# Patient Record
Sex: Female | Born: 1945 | Race: White | Hispanic: No | Marital: Married | Smoking: Former smoker
Health system: Southern US, Community
[De-identification: ages and names within clinical notes are randomized; demographics above are authoritative.]

## PROBLEM LIST (undated history)

## (undated) DIAGNOSIS — Z8489 Family history of other specified conditions: Secondary | ICD-10-CM

## (undated) DIAGNOSIS — I10 Essential (primary) hypertension: Secondary | ICD-10-CM

## (undated) DIAGNOSIS — K219 Gastro-esophageal reflux disease without esophagitis: Secondary | ICD-10-CM

## (undated) DIAGNOSIS — M1990 Unspecified osteoarthritis, unspecified site: Secondary | ICD-10-CM

---

## 1998-02-20 ENCOUNTER — Other Ambulatory Visit: Admission: RE | Admit: 1998-02-20 | Discharge: 1998-02-20 | Payer: Self-pay | Admitting: Obstetrics and Gynecology

## 1999-05-28 ENCOUNTER — Other Ambulatory Visit: Admission: RE | Admit: 1999-05-28 | Discharge: 1999-05-28 | Payer: Self-pay | Admitting: Obstetrics and Gynecology

## 2000-08-08 ENCOUNTER — Encounter: Admission: RE | Admit: 2000-08-08 | Discharge: 2000-08-08 | Payer: Self-pay | Admitting: Internal Medicine

## 2000-08-08 ENCOUNTER — Encounter: Payer: Self-pay | Admitting: Internal Medicine

## 2000-12-21 ENCOUNTER — Encounter: Admission: RE | Admit: 2000-12-21 | Discharge: 2000-12-21 | Payer: Self-pay | Admitting: Obstetrics and Gynecology

## 2000-12-21 ENCOUNTER — Other Ambulatory Visit: Admission: RE | Admit: 2000-12-21 | Discharge: 2000-12-21 | Payer: Self-pay | Admitting: Obstetrics and Gynecology

## 2000-12-21 ENCOUNTER — Encounter: Payer: Self-pay | Admitting: Obstetrics and Gynecology

## 2002-11-02 ENCOUNTER — Encounter: Payer: Self-pay | Admitting: Obstetrics and Gynecology

## 2002-11-02 ENCOUNTER — Ambulatory Visit (HOSPITAL_COMMUNITY): Admission: RE | Admit: 2002-11-02 | Discharge: 2002-11-02 | Payer: Self-pay | Admitting: Obstetrics and Gynecology

## 2004-11-03 ENCOUNTER — Other Ambulatory Visit: Admission: RE | Admit: 2004-11-03 | Discharge: 2004-11-03 | Payer: Self-pay | Admitting: Obstetrics and Gynecology

## 2004-11-06 ENCOUNTER — Encounter: Admission: RE | Admit: 2004-11-06 | Discharge: 2004-11-06 | Payer: Self-pay | Admitting: Obstetrics and Gynecology

## 2004-12-15 ENCOUNTER — Ambulatory Visit (HOSPITAL_COMMUNITY): Admission: RE | Admit: 2004-12-15 | Discharge: 2004-12-15 | Payer: Self-pay | Admitting: Obstetrics and Gynecology

## 2004-12-15 IMAGING — US US PELVIS COMPLETE MODIFY
1 series · 18 of 25 positions shown · non-contrast
Comparison: none

CLINICAL DATA: Positive family history of ovarian carcinoma in mother (first degree relative).
 TRANSABDOMINAL AND TRANSVAGINAL PELVIC ULTRASOUND:
TECHNIQUE: Both transabdominal and transvaginal ultrasound examinations of the pelvis were performed including evaluation of the uterus, ovaries, adnexal regions, and pelvic cul-de-sac.
 The uterus is normal in postmenopausal size and appearance.  There is no evidence of fibroids or other uterine masses.  Endometrial thickness measures 2 mm on transvaginal sonography which is normal.  
 No pelvic or adnexal masses are identified on transabdominal sonography.  Small postmenopausal ovaries are seen bilaterally on transvaginal sonography, which are both normal in appearance.  There is no evidence of ovarian masses or cystic lesions.  No other adnexal masses are seen and there is no evidence of free fluid.

[Series 1: us transvaginal non-ob · 18 of 59 slices shown]
[im 1/59]
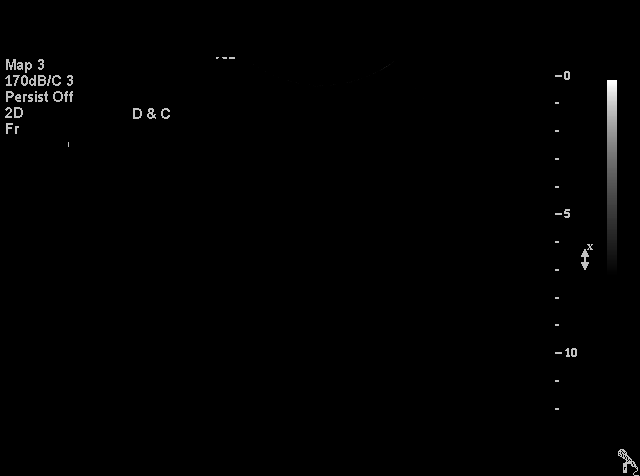
[im 5/59]
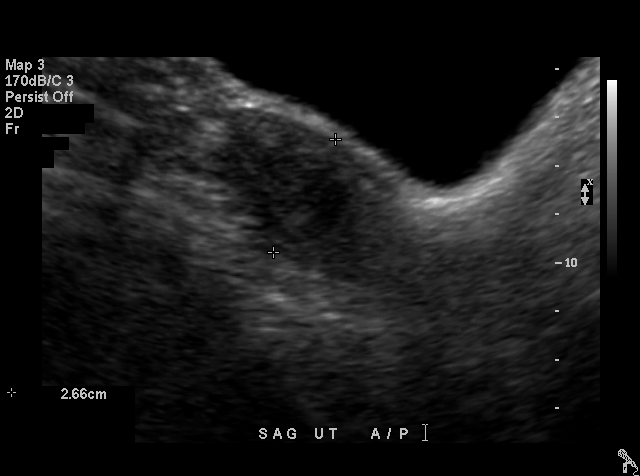
[im 8/59]
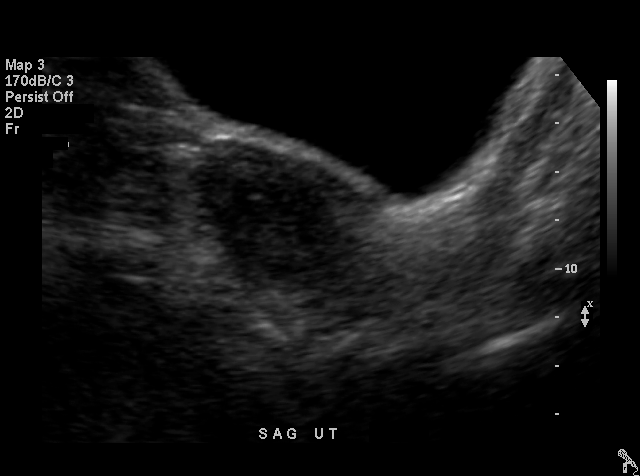
[im 10/59]
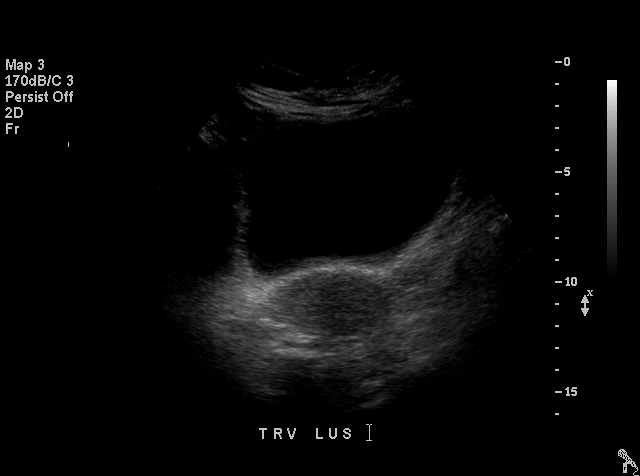
[im 15/59]
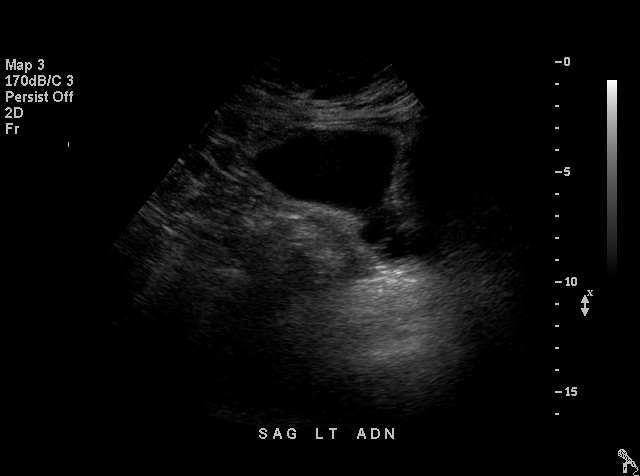
[im 17/59]
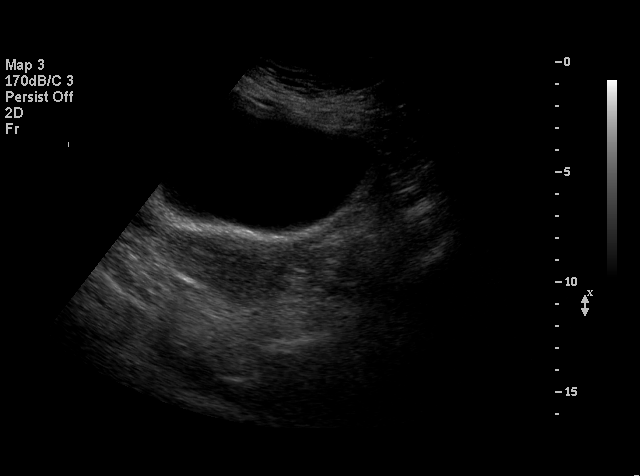
[im 22/59]
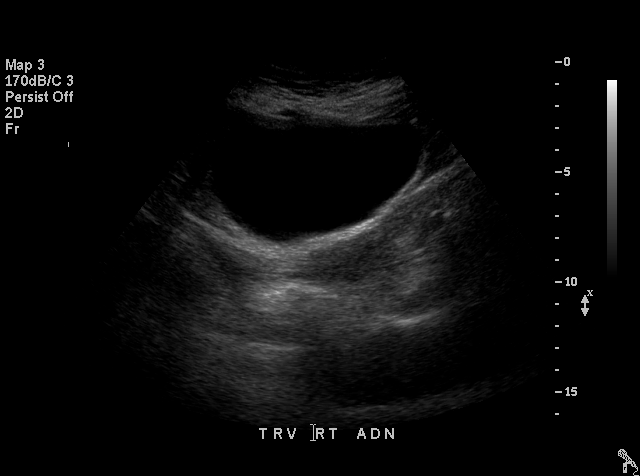
[im 25/59]
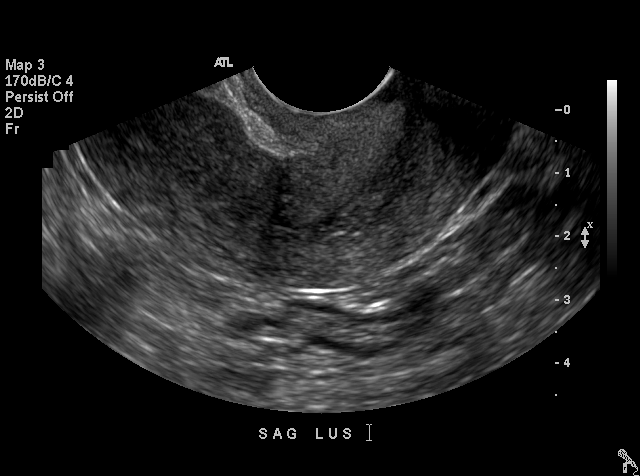
[im 27/59]
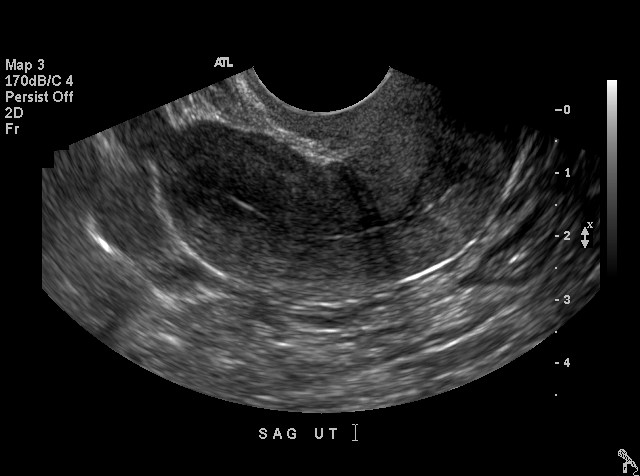
[im 32/59]
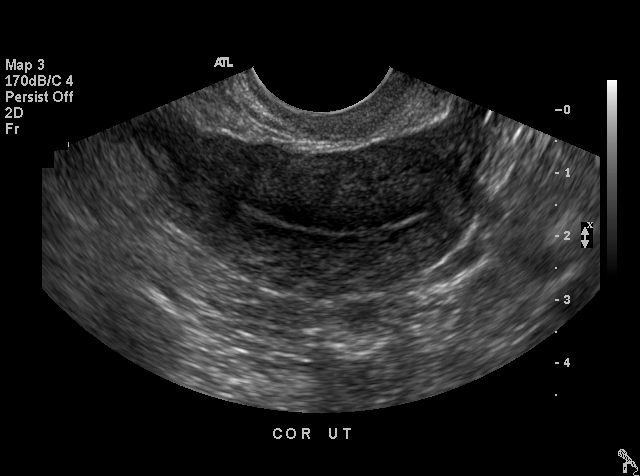
[im 34/59]
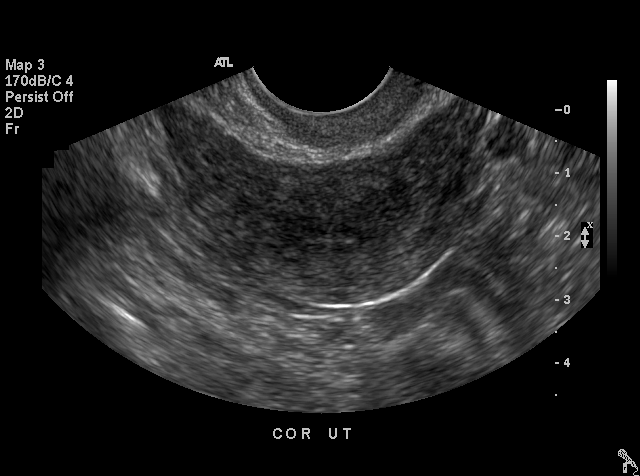
[im 37/59]
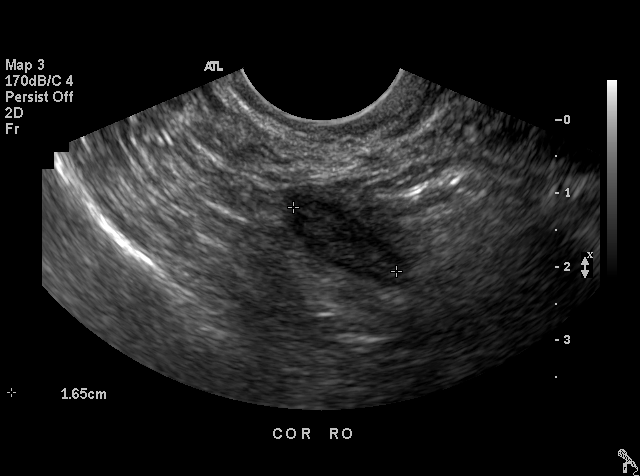
[im 42/59]
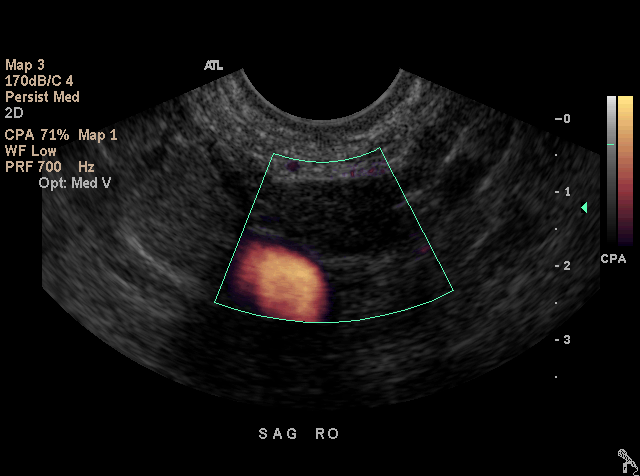
[im 44/59]
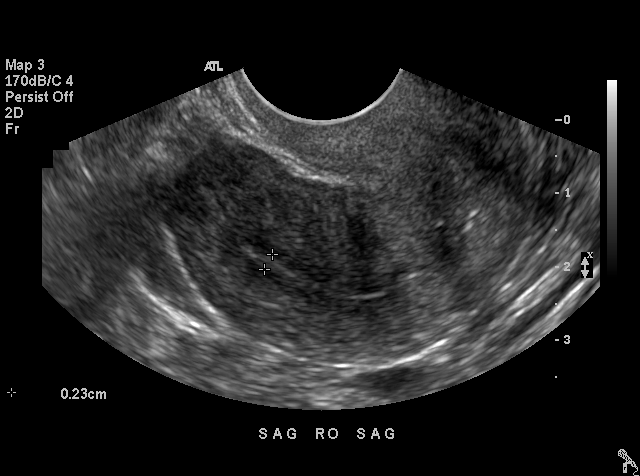
[im 49/59]
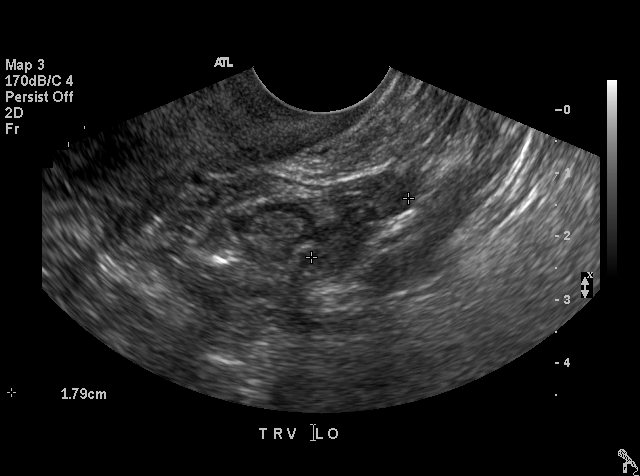
[im 51/59]
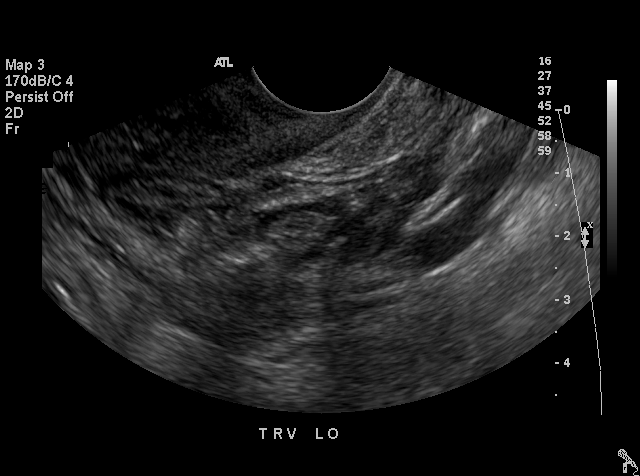
[im 54/59]
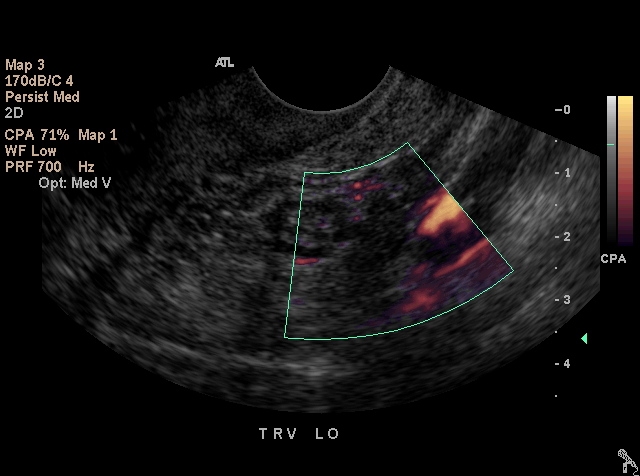
[im 59/59]
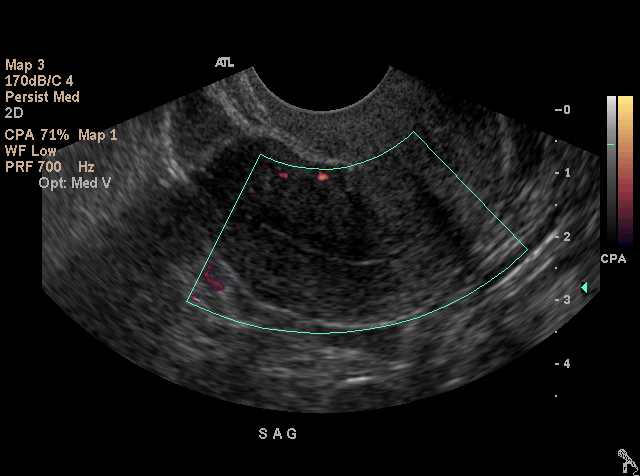

[18 of 25 positions shown; findings below may reference images not displayed]

IMPRESSION: 1.  Normal postmenopausal appearance of the uterus and both ovaries.
 2.  No evidence of ovarian or other pelvic masses.

## 2005-05-26 ENCOUNTER — Ambulatory Visit (HOSPITAL_COMMUNITY): Admission: RE | Admit: 2005-05-26 | Discharge: 2005-05-26 | Payer: Self-pay | Admitting: Internal Medicine

## 2005-05-26 IMAGING — CR DG CHEST 2V
2 series · 2 of 2 positions shown · non-contrast
Comparison: None.

CLINICAL DATA: Cough and fever. 
 CHEST ? 2 VIEW:

[view not recorded (1 of 2)]
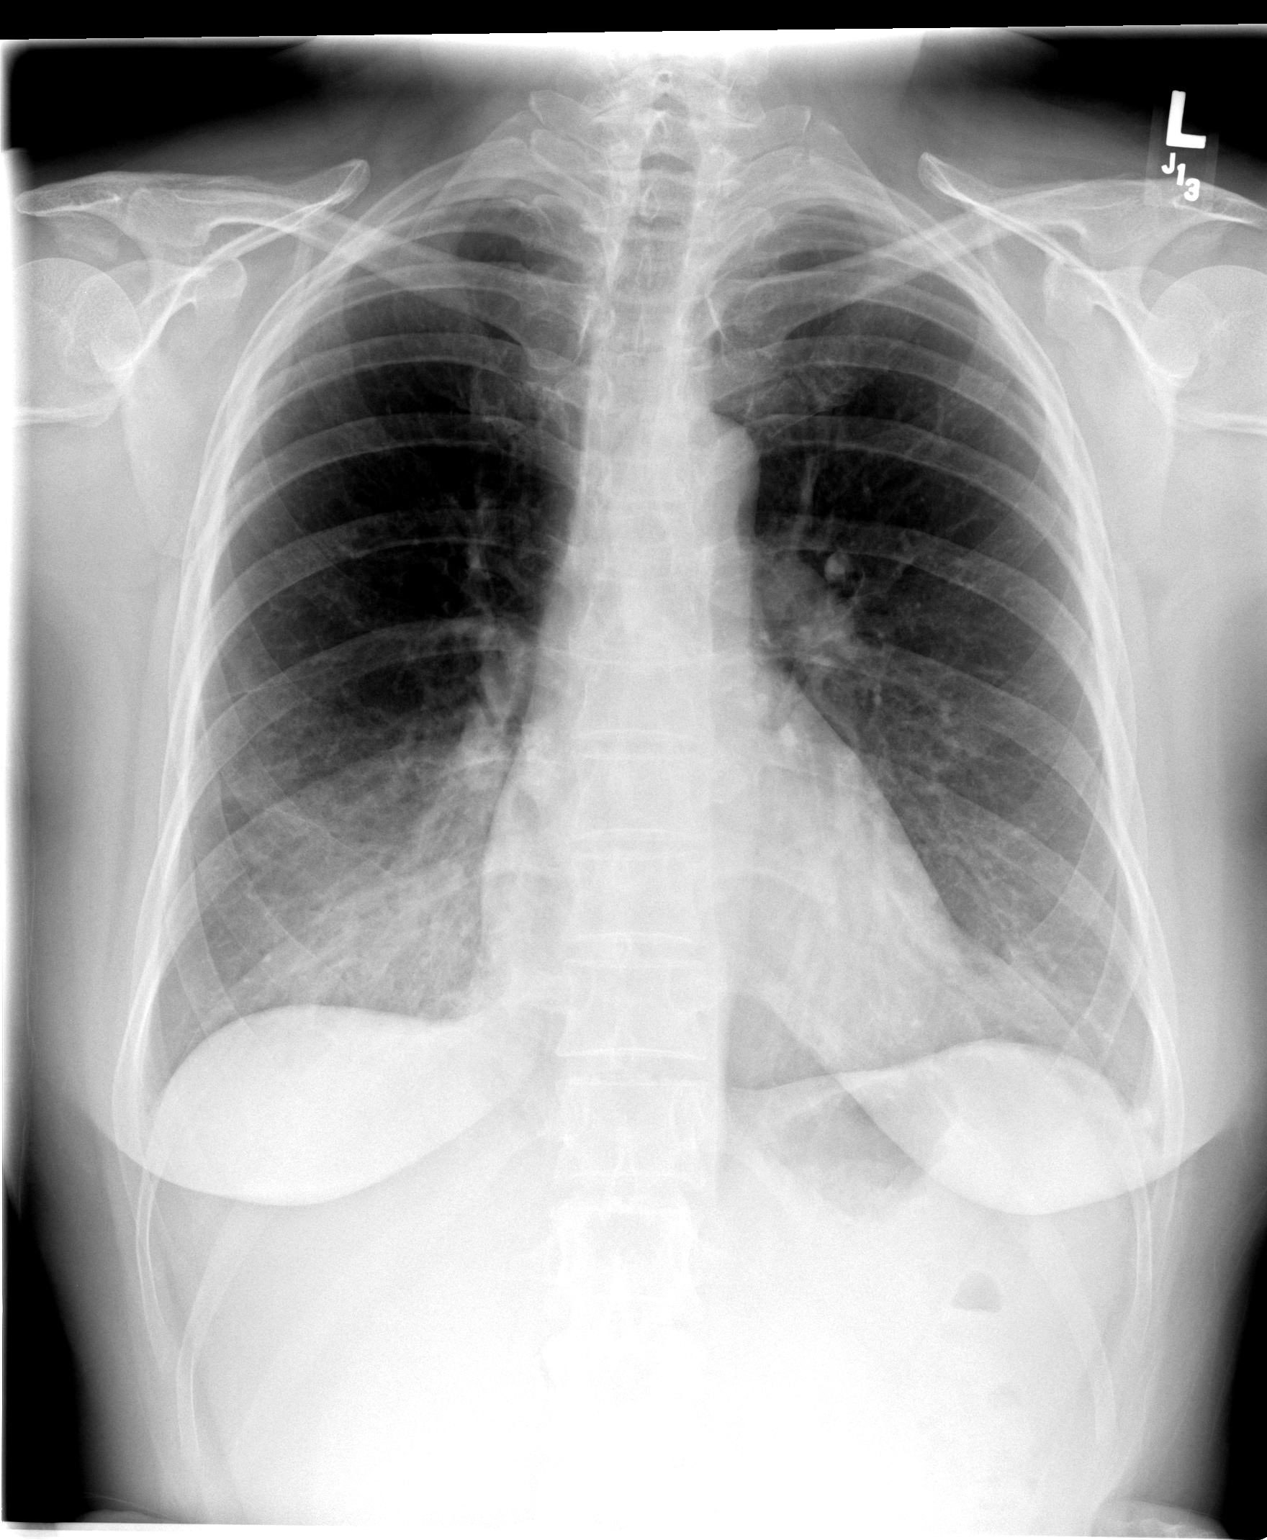

[view not recorded (2 of 2)]
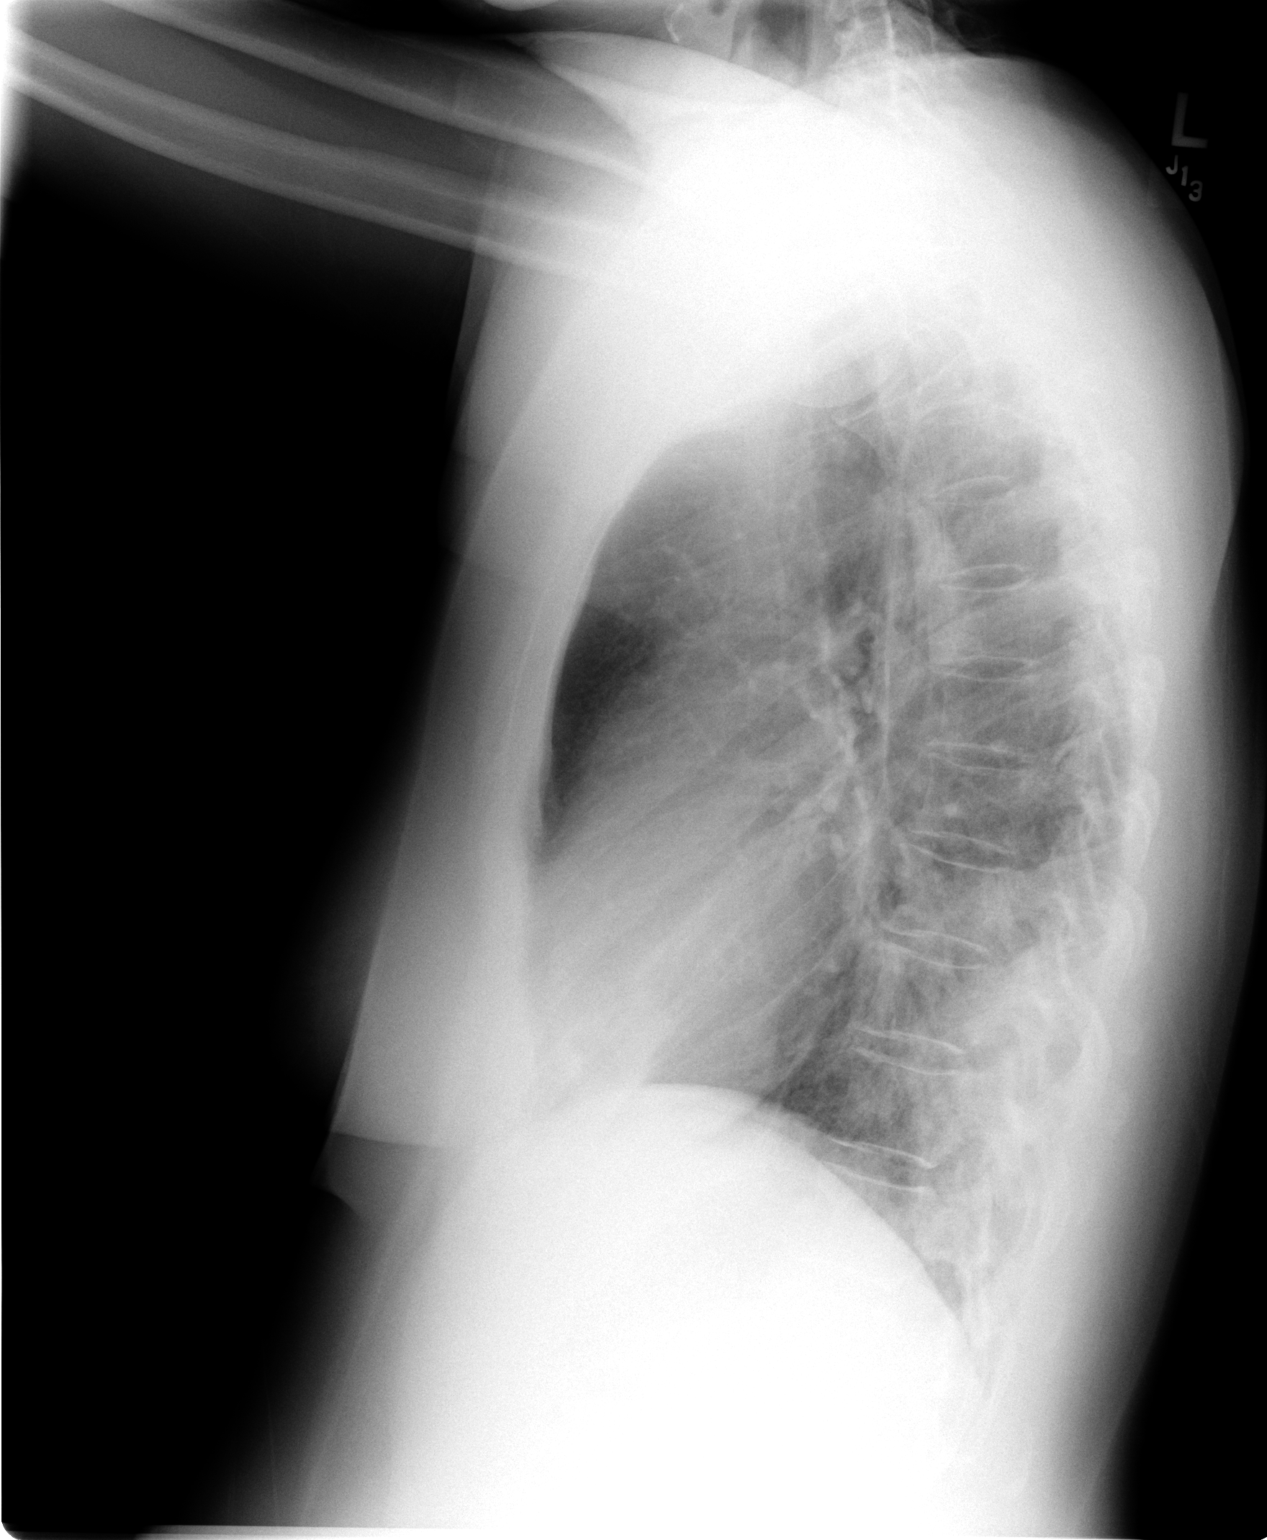

[2 of 2 positions shown; findings below may reference images not displayed]

FINDINGS: Confluent opacity is seen in the posterior right lower lobe, abutting the posterior chest wall, suspicious for pneumonia.  Left lung is clear.  There is no evidence of pleural effusion.  The heart size and mediastinal contours are within normal limits.
IMPRESSION: Confluent opacity in the posterior right lower lobe, consistent with pneumonia.  Posttreatment radiographic followup is recommended to confirm resolution.

## 2005-06-29 ENCOUNTER — Ambulatory Visit (HOSPITAL_COMMUNITY): Admission: RE | Admit: 2005-06-29 | Discharge: 2005-06-29 | Payer: Self-pay | Admitting: Internal Medicine

## 2005-06-29 IMAGING — CR DG CHEST 2V
2 series · 2 of 2 positions shown · non-contrast
Comparison: none

CLINICAL DATA: Reevaluate pneumonia.
CHEST - 2 VIEW:

[view not recorded (1 of 2)]
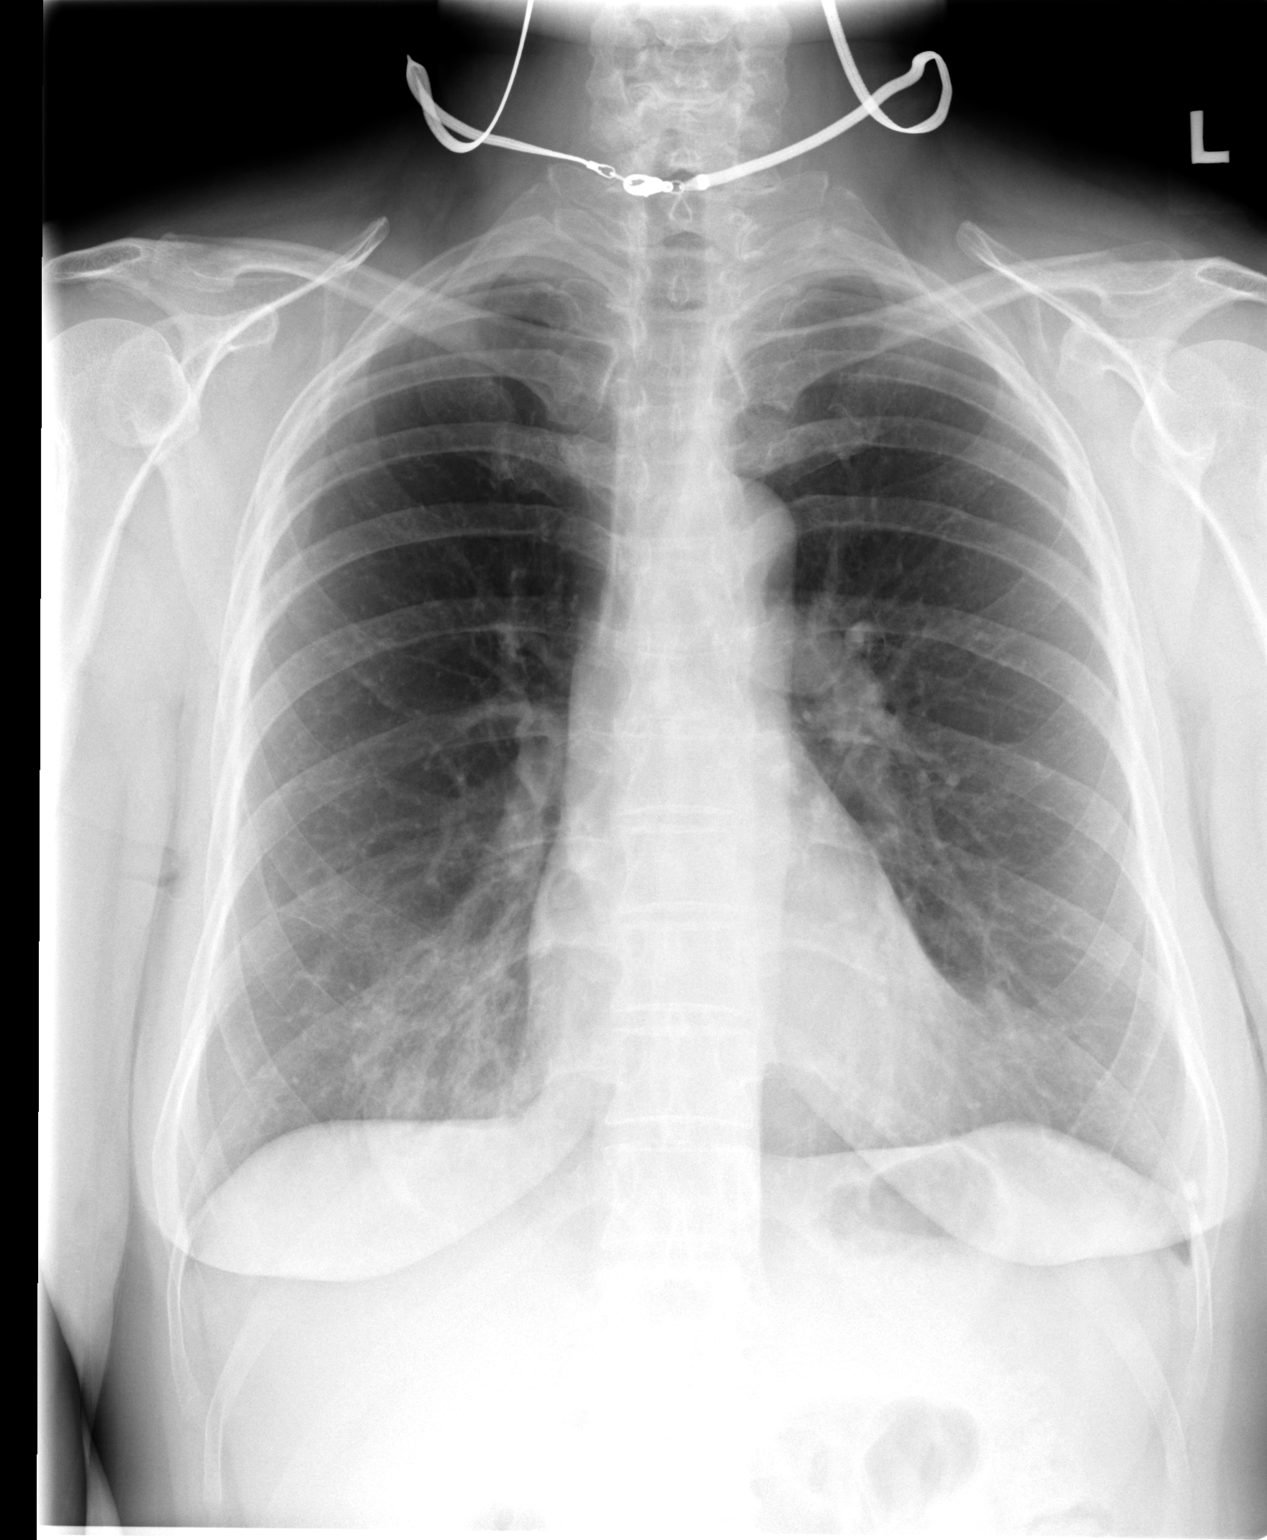

[view not recorded (2 of 2)]
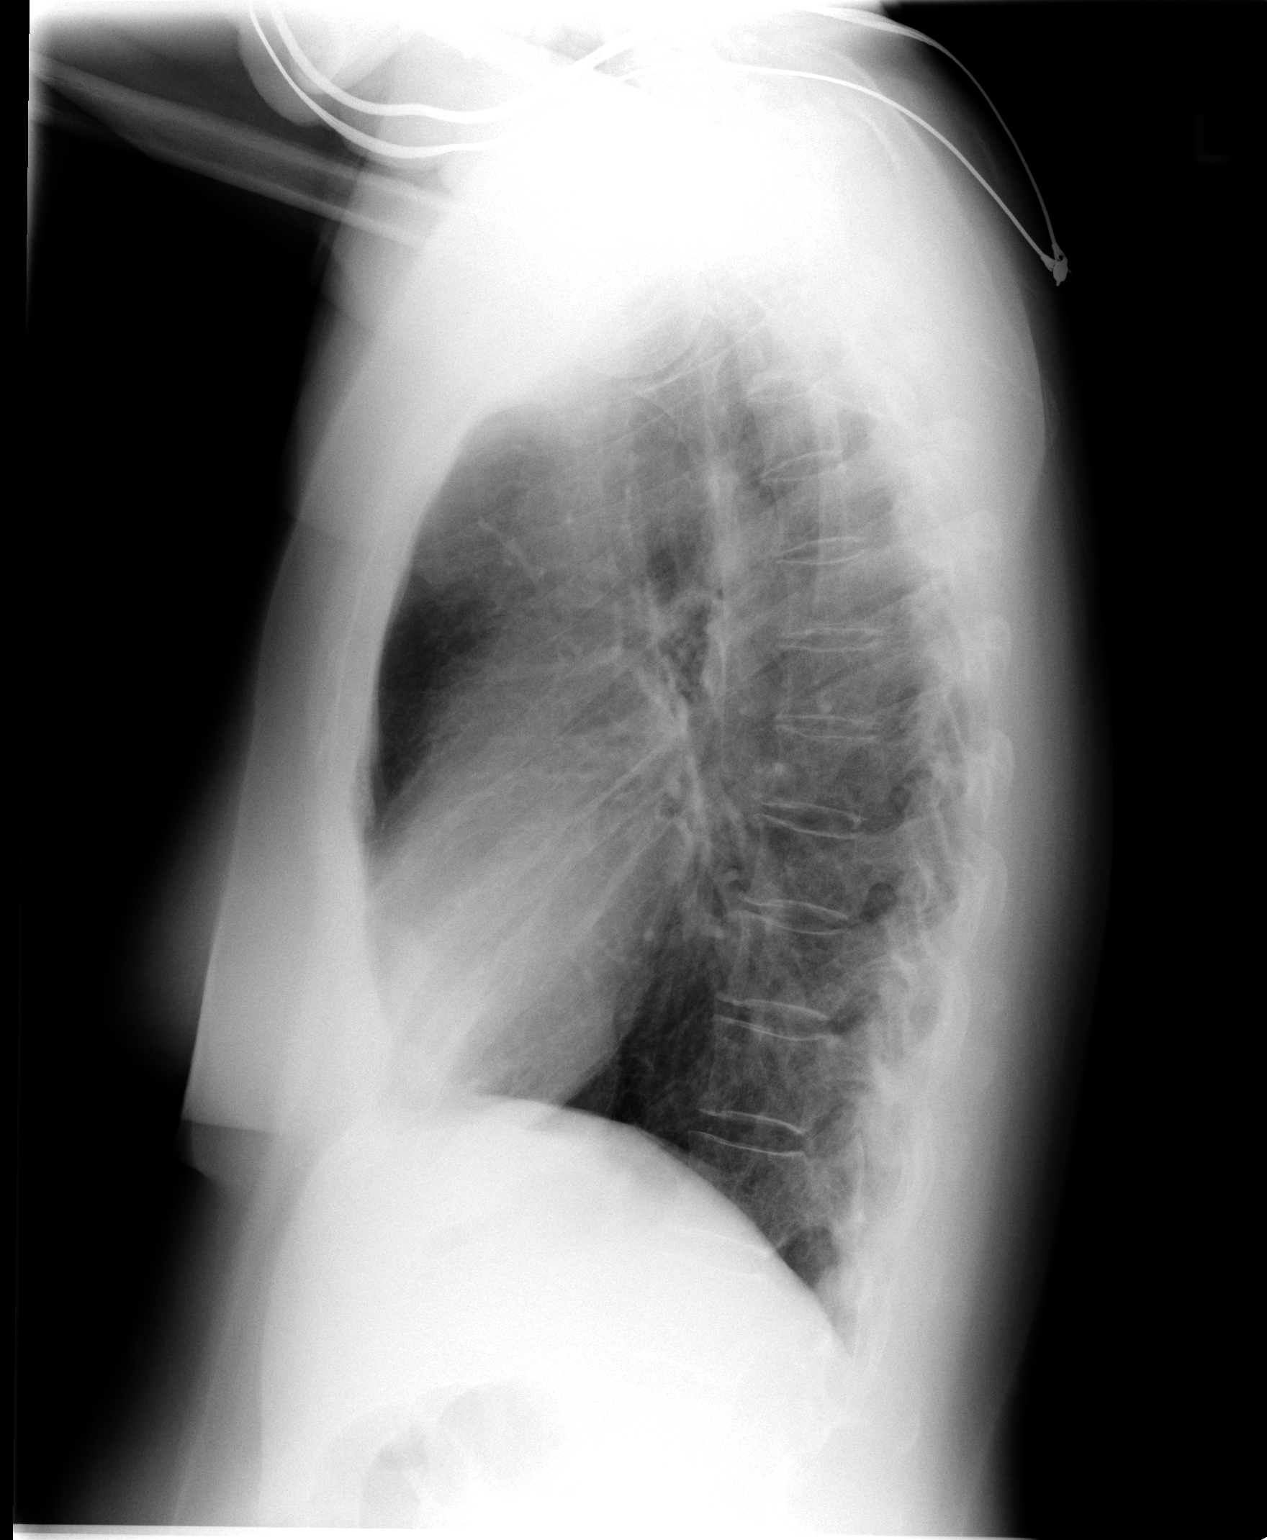

[2 of 2 positions shown; findings below may reference images not displayed]

FINDINGS: The cardiac silhouette, mediastinum, and pulmonary vasculature are within normal limits.  The patchy opacity within the posterior right lower lobe has improved somewhat since the prior film.  There is a stable calcified granuloma within the left lung base.
IMPRESSION: There has been some interval clearing of the posterior right lower lobe infiltrate.  An additional follow-up chest x-ray in approximately one month is recommended to document complete resolution.

## 2005-08-05 ENCOUNTER — Ambulatory Visit (HOSPITAL_COMMUNITY): Admission: RE | Admit: 2005-08-05 | Discharge: 2005-08-05 | Payer: Self-pay | Admitting: Internal Medicine

## 2005-08-05 IMAGING — CR DG CHEST 2V
2 series · 2 of 2 positions shown · non-contrast
Comparison: none

CLINICAL DATA: Follow-up pneumothorax.  Correlation is made with a previous exam on [DATE]. 
 CHEST - 2 VIEW ? [DATE]:
 Heart and mediastinal contours are within normal limits.  The lung fields today demonstrate interval clearing of the previously noted right lower lobe infiltrate. No new focal infiltrates or signs of congestive heart failure are seen.   Granulomata are noted in the left costophrenic angle.

[view not recorded (1 of 2)]
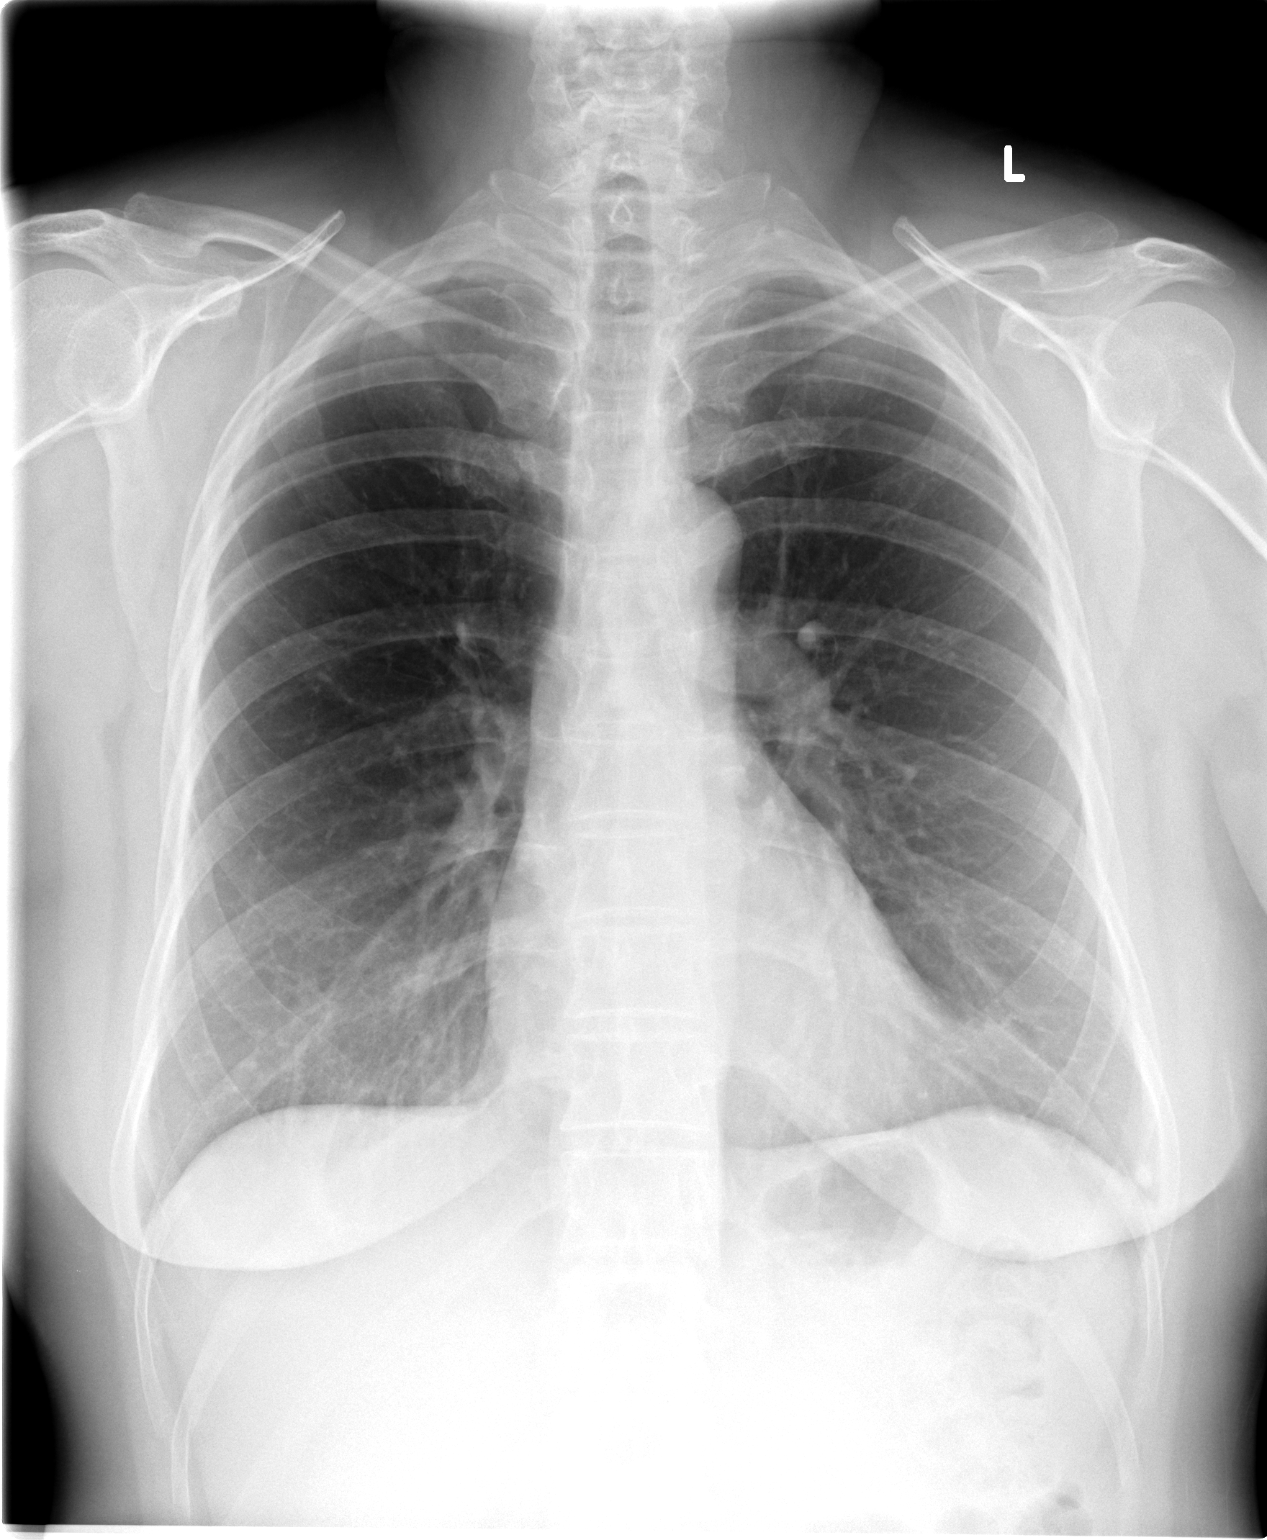

[view not recorded (2 of 2)]
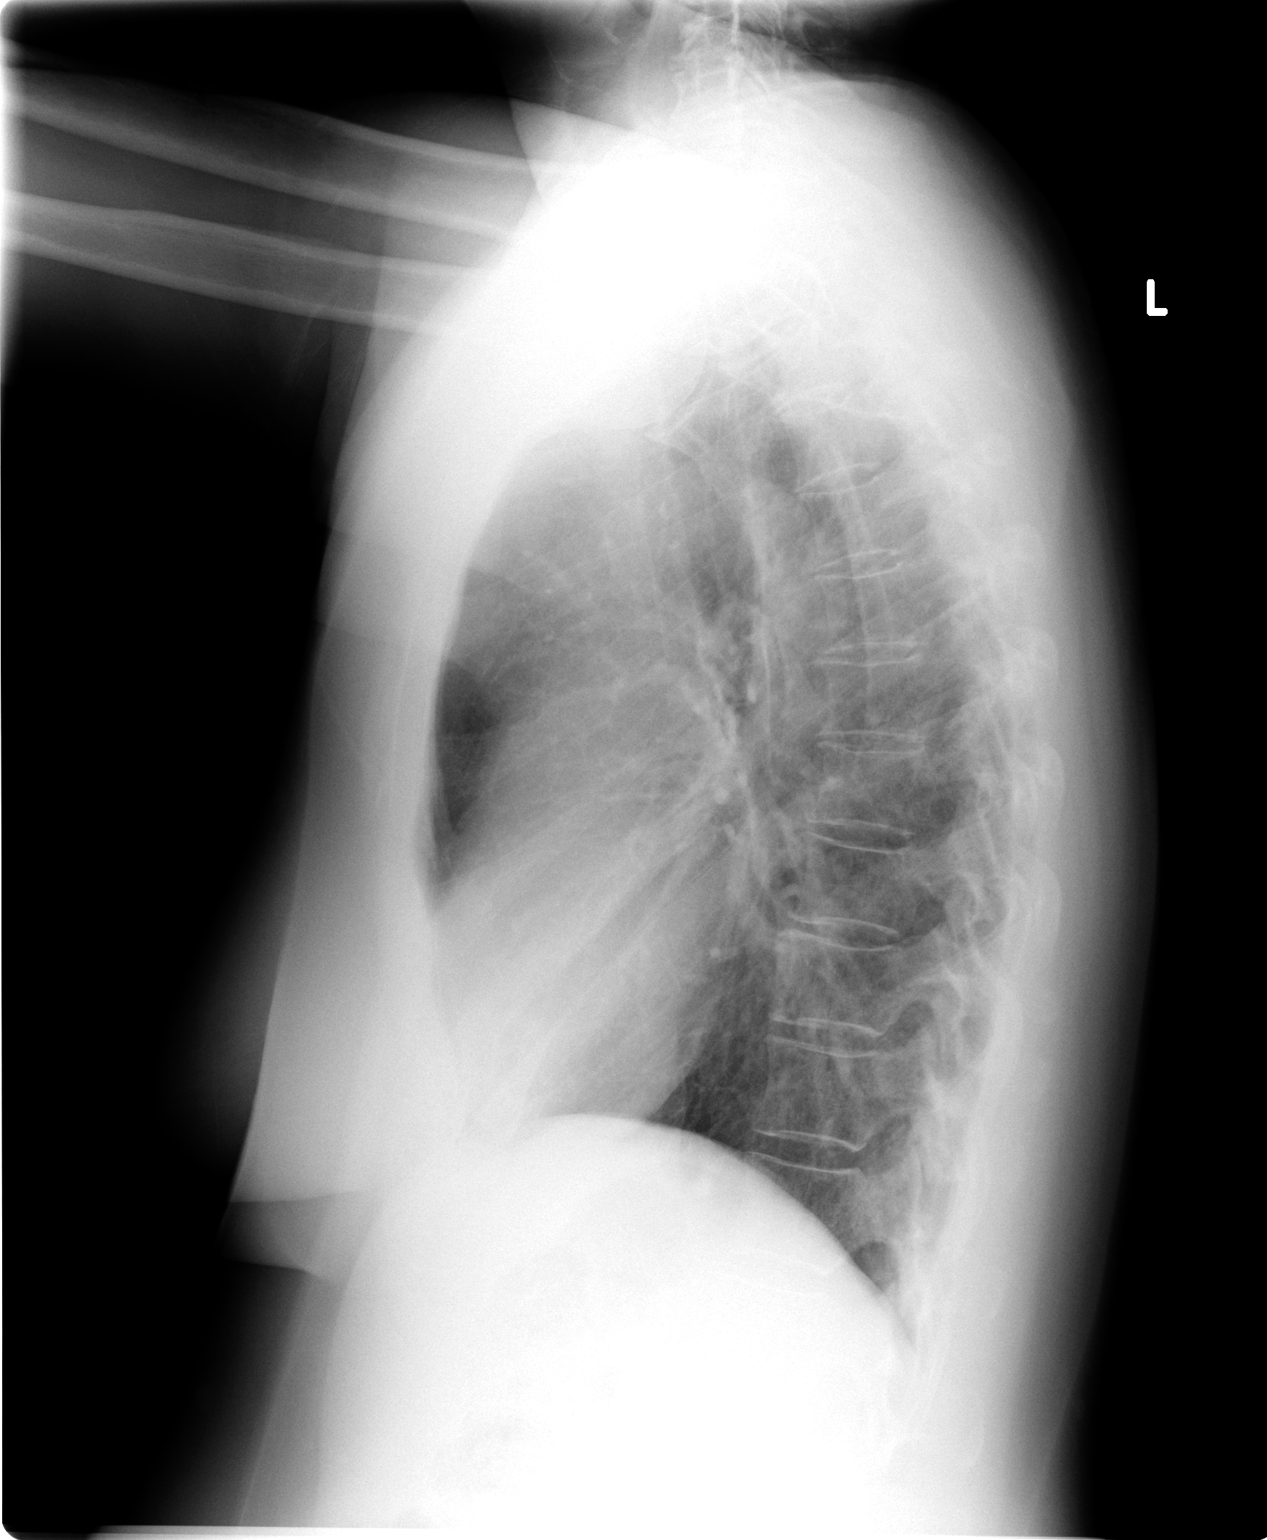

[2 of 2 positions shown; findings below may reference images not displayed]

IMPRESSION: Interval clearing of right lower lobe pneumonia.

## 2005-11-12 ENCOUNTER — Encounter: Admission: RE | Admit: 2005-11-12 | Discharge: 2005-11-12 | Payer: Self-pay | Admitting: Internal Medicine

## 2005-11-15 ENCOUNTER — Other Ambulatory Visit: Admission: RE | Admit: 2005-11-15 | Discharge: 2005-11-15 | Payer: Self-pay | Admitting: Obstetrics and Gynecology

## 2006-11-14 ENCOUNTER — Encounter: Admission: RE | Admit: 2006-11-14 | Discharge: 2006-11-14 | Payer: Self-pay | Admitting: Obstetrics and Gynecology

## 2006-12-02 ENCOUNTER — Other Ambulatory Visit: Admission: RE | Admit: 2006-12-02 | Discharge: 2006-12-02 | Payer: Self-pay | Admitting: Obstetrics and Gynecology

## 2007-11-15 ENCOUNTER — Encounter: Admission: RE | Admit: 2007-11-15 | Discharge: 2007-11-15 | Payer: Self-pay | Admitting: Obstetrics and Gynecology

## 2008-03-20 ENCOUNTER — Other Ambulatory Visit: Admission: RE | Admit: 2008-03-20 | Discharge: 2008-03-20 | Payer: Self-pay | Admitting: Obstetrics and Gynecology

## 2008-11-15 ENCOUNTER — Encounter: Admission: RE | Admit: 2008-11-15 | Discharge: 2008-11-15 | Payer: Self-pay | Admitting: Obstetrics and Gynecology

## 2009-09-04 ENCOUNTER — Other Ambulatory Visit: Admission: RE | Admit: 2009-09-04 | Discharge: 2009-09-04 | Payer: Self-pay | Admitting: Obstetrics and Gynecology

## 2009-11-18 ENCOUNTER — Encounter: Admission: RE | Admit: 2009-11-18 | Discharge: 2009-11-18 | Payer: Self-pay | Admitting: Obstetrics and Gynecology

## 2010-10-20 ENCOUNTER — Other Ambulatory Visit: Payer: Self-pay | Admitting: Family Medicine

## 2010-10-20 DIAGNOSIS — Z1231 Encounter for screening mammogram for malignant neoplasm of breast: Secondary | ICD-10-CM

## 2010-11-20 ENCOUNTER — Ambulatory Visit
Admission: RE | Admit: 2010-11-20 | Discharge: 2010-11-20 | Disposition: A | Discharge status: Home / Self Care | Payer: Medicare Other | Source: Ambulatory Visit | Attending: Family Medicine | Admitting: Family Medicine

## 2010-11-20 DIAGNOSIS — Z1231 Encounter for screening mammogram for malignant neoplasm of breast: Secondary | ICD-10-CM

## 2011-03-18 ENCOUNTER — Other Ambulatory Visit: Payer: Self-pay | Admitting: Gastroenterology

## 2011-03-18 DIAGNOSIS — R110 Nausea: Secondary | ICD-10-CM

## 2011-03-22 ENCOUNTER — Ambulatory Visit
Admission: RE | Admit: 2011-03-22 | Discharge: 2011-03-22 | Disposition: A | Discharge status: Home / Self Care | Payer: Medicare Other | Source: Ambulatory Visit | Attending: Gastroenterology | Admitting: Gastroenterology

## 2011-03-22 DIAGNOSIS — R110 Nausea: Secondary | ICD-10-CM

## 2011-03-22 IMAGING — US US ABDOMEN COMPLETE
1 series · 14 of 25 positions shown · non-contrast
Comparison: None.

CLINICAL DATA: Abdominal pain.  Nausea.

COMPLETE ABDOMINAL ULTRASOUND

[Series 1: us abdomen complete · 14 of 83 slices shown]
[im 1/83]
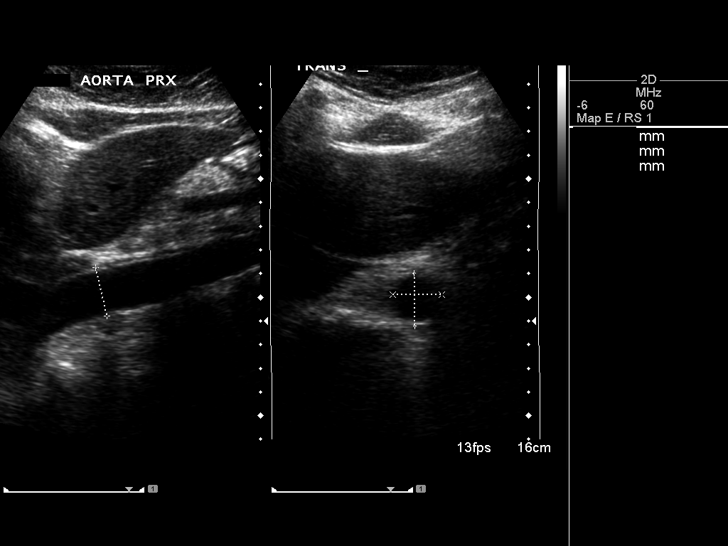
[im 7/83]
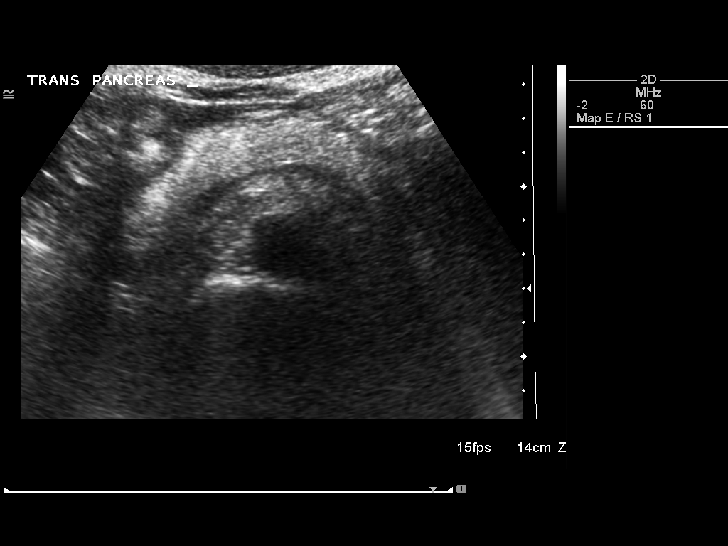
[im 14/83]
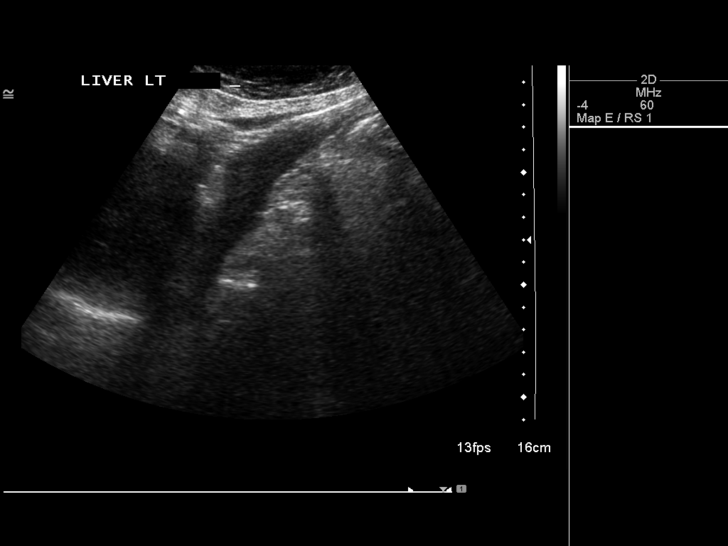
[im 21/83]
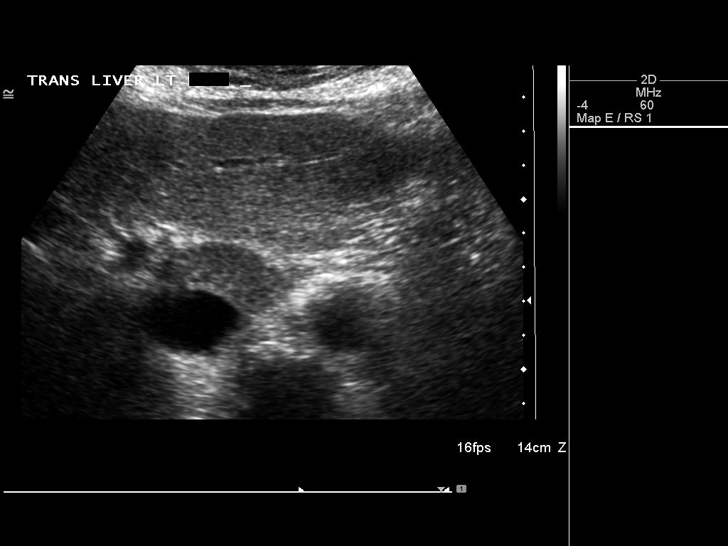
[im 28/83]
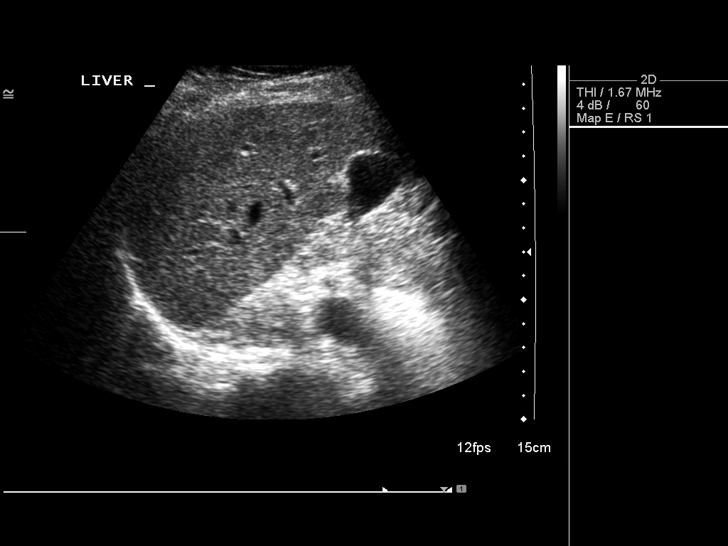
[im 31/83]
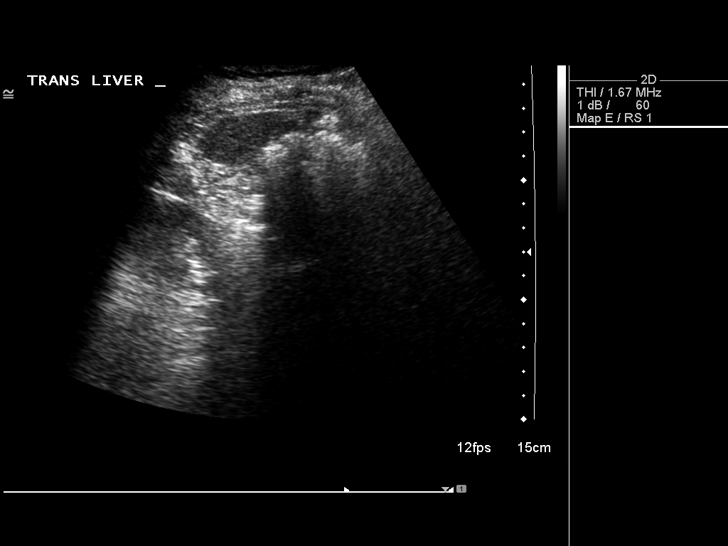
[im 38/83]
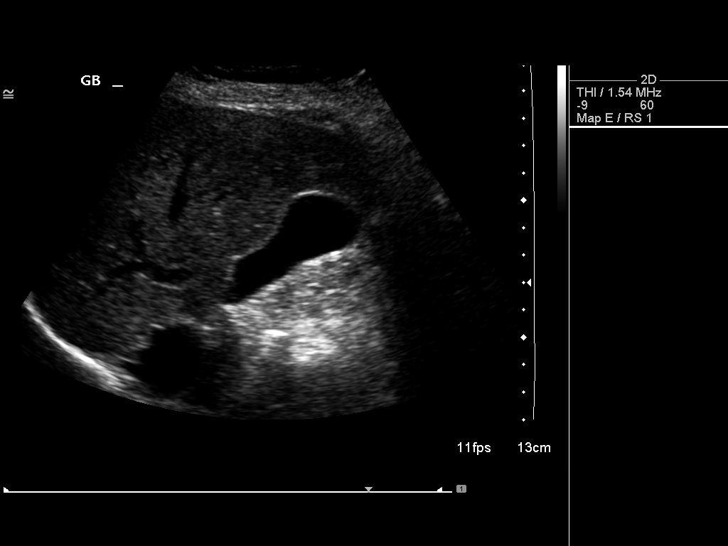
[im 45/83]
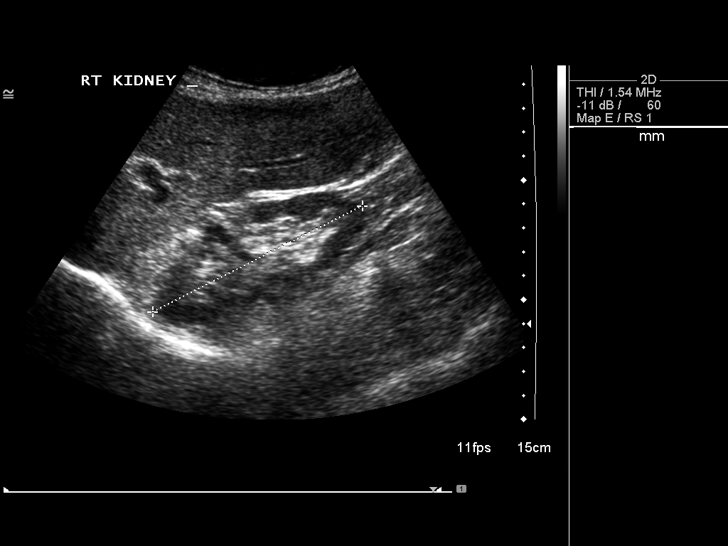
[im 52/83]
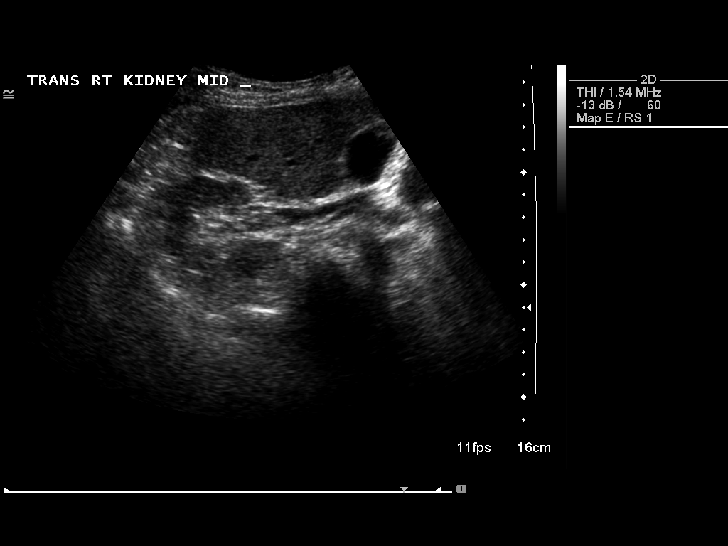
[im 55/83]
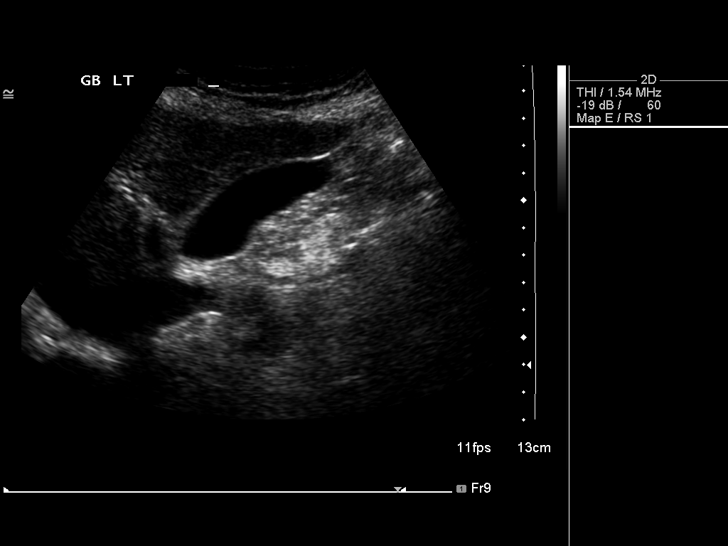
[im 62/83]
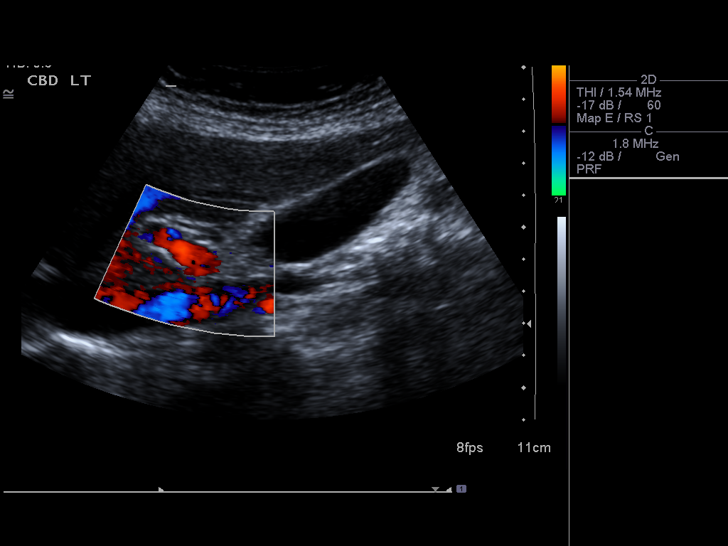
[im 69/83]
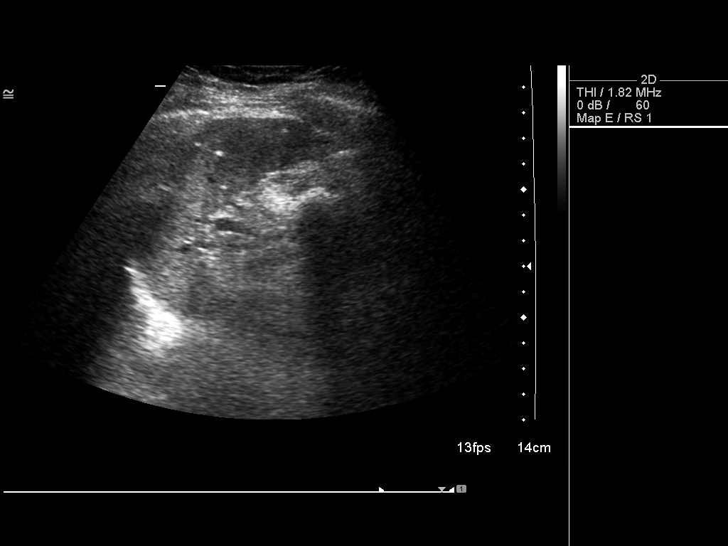
[im 76/83]
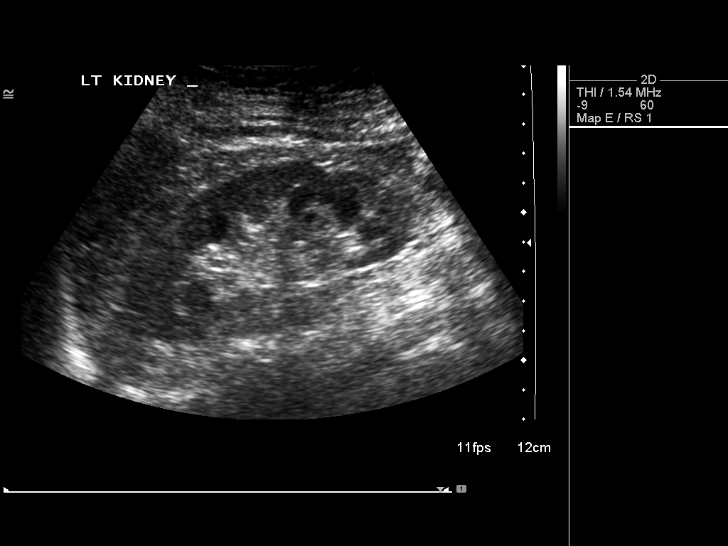
[im 83/83]
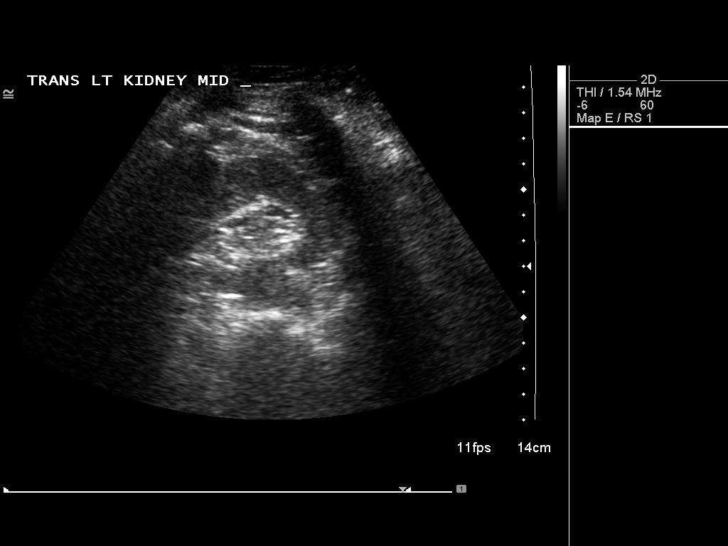

[14 of 25 positions shown; findings below may reference images not displayed]

FINDINGS: Gallbladder:  Normal, without wall thickening, stone, or
pericholecystic fluid.  Sonographic Murphy's sign was not elicited.

Common bile duct: Normal, at 3 mm.

Liver: Normal in echogenicity, without focal lesion.

IVC: Negative

Pancreas:  Partially obscured pancreatic head and tail.

Spleen:  Normal in size and echogenicity.

Right Kidney:  9.8 cm. No hydronephrosis.

Left Kidney:  9.9 cm. No hydronephrosis.

Abdominal aorta:  Nonaneurysmal without ascites.
IMPRESSION: No acute process, or explanation for abdominal pain.

## 2011-07-05 ENCOUNTER — Other Ambulatory Visit: Payer: Self-pay | Admitting: Gastroenterology

## 2011-10-22 ENCOUNTER — Other Ambulatory Visit: Payer: Self-pay | Admitting: Family Medicine

## 2011-10-22 DIAGNOSIS — Z1231 Encounter for screening mammogram for malignant neoplasm of breast: Secondary | ICD-10-CM

## 2011-11-23 ENCOUNTER — Ambulatory Visit: Payer: Medicare Other

## 2011-12-01 ENCOUNTER — Ambulatory Visit
Admission: RE | Admit: 2011-12-01 | Discharge: 2011-12-01 | Disposition: A | Discharge status: Home / Self Care | Payer: Medicare Other | Source: Ambulatory Visit | Attending: Family Medicine | Admitting: Family Medicine

## 2011-12-01 DIAGNOSIS — Z1231 Encounter for screening mammogram for malignant neoplasm of breast: Secondary | ICD-10-CM

## 2011-12-24 ENCOUNTER — Other Ambulatory Visit: Payer: Self-pay | Admitting: Obstetrics and Gynecology

## 2011-12-24 ENCOUNTER — Other Ambulatory Visit (HOSPITAL_COMMUNITY)
Admission: RE | Admit: 2011-12-24 | Discharge: 2011-12-24 | Disposition: A | Discharge status: Home / Self Care | Payer: Medicare Other | Source: Ambulatory Visit | Attending: Obstetrics and Gynecology | Admitting: Obstetrics and Gynecology

## 2011-12-24 DIAGNOSIS — Z124 Encounter for screening for malignant neoplasm of cervix: Secondary | ICD-10-CM | POA: Insufficient documentation

## 2011-12-24 DIAGNOSIS — N760 Acute vaginitis: Secondary | ICD-10-CM | POA: Insufficient documentation

## 2012-11-10 ENCOUNTER — Other Ambulatory Visit: Payer: Self-pay

## 2012-11-10 DIAGNOSIS — Z1231 Encounter for screening mammogram for malignant neoplasm of breast: Secondary | ICD-10-CM

## 2012-12-05 ENCOUNTER — Ambulatory Visit
Admission: RE | Admit: 2012-12-05 | Discharge: 2012-12-05 | Disposition: A | Discharge status: Home / Self Care | Payer: Medicare Other | Source: Ambulatory Visit

## 2012-12-05 DIAGNOSIS — Z1231 Encounter for screening mammogram for malignant neoplasm of breast: Secondary | ICD-10-CM

## 2013-05-04 ENCOUNTER — Other Ambulatory Visit: Payer: Self-pay | Admitting: Dermatology

## 2013-11-13 ENCOUNTER — Other Ambulatory Visit: Payer: Self-pay

## 2013-11-13 DIAGNOSIS — Z1231 Encounter for screening mammogram for malignant neoplasm of breast: Secondary | ICD-10-CM

## 2013-12-07 ENCOUNTER — Ambulatory Visit
Admission: RE | Admit: 2013-12-07 | Discharge: 2013-12-07 | Disposition: A | Discharge status: Home / Self Care | Payer: 59 | Source: Ambulatory Visit

## 2013-12-07 ENCOUNTER — Encounter (INDEPENDENT_AMBULATORY_CARE_PROVIDER_SITE_OTHER): Payer: Self-pay

## 2013-12-07 DIAGNOSIS — Z1231 Encounter for screening mammogram for malignant neoplasm of breast: Secondary | ICD-10-CM

## 2014-06-19 ENCOUNTER — Other Ambulatory Visit: Payer: Self-pay | Admitting: Obstetrics and Gynecology

## 2014-06-20 LAB — CYTOLOGY - PAP

## 2014-06-27 ENCOUNTER — Ambulatory Visit: Payer: Self-pay | Admitting: Orthopedic Surgery

## 2014-06-27 NOTE — Progress Notes (Signed)
Preoperative surgical orders have been place into the Epic hospital system for Connie Lindsey on 06/27/2014, 6:12 PM  by Patrica DuelPERKINS, Kalif Kattner for surgery on 07-17-2014.  Preop Total Hip - Anterior Approach orders including Experel Injecion, IV Tylenol, and IV Decadron as long as there are no contraindications to the above medications. Avel Peacerew Peni Rupard, PA-C

## 2014-06-27 NOTE — H&P (Signed)
Connie Lindsey DOB: [birth year] Married / Language: English / Race: [place] Female Date of Admission:  07/17/2014 CC:  Left Hip Pain History of Present Illness  The patient is a 69 year old female who comes in for a preoperative History and Physical. The patient is scheduled for a left total hip arthroplasty (anterior approach) to be performed by Dr. Gus RankinFrank V. Aluisio, MD at [facility] on 07-17-2014. The patient is a 69 year old female who presents  for follow up of their hip. The patient is being followed for their left hip pain and osteoarthritis. They are 4 month(s) (1/2) out from last visit. Symptoms reported today include: pain. The patient feels that they are doing poorly. The following medication has been used for pain control: none. The patient indicates that they have questions or concerns today regarding surgery. Unfortunately, her hip has gotten progressively worse over the past year. She is having a much harder time getting around now. She is losing more mobility. She is having a harder time doing things she desires. She states she still is having discomfort in the left hip and does have some difficulty with certain bending motions. She is ready to proceed with the hip replacement.  They have been treated conservatively in the past for the above stated problem and despite conservative measures, they continue to have progressive pain and severe functional limitations and dysfunction. They have failed non-operative management including home exercise, medications. It is felt that they would benefit from undergoing total joint replacement. Risks and benefits of the procedure have been discussed with the patient and they elect to proceed with surgery. There are no active contraindications to surgery such as ongoing infection or rapidly progressive neurological disease.  Problem List/Past Medical Primary osteoarthritis of left hip (M16.12) Hip osteoarthritis (M16.9) High blood  pressure Osteoarthritis Gastroesophageal Reflux Disease Tinnitus Shingles Urinary Incontinence Stress Osteopenia Menopause Diverticulosis  Allergies: NO KNOWN DRUG ALLERGIES   Intolerances: Codeine Sulfate *ANALGESICS - OPIOID* Nausea. Sulfa drugs12/22/1999 Nausea. Benadryl *ANTIHISTAMINES* Nausea. Penicillins Nausea.  Family History Hypertension Mother. Cancer Mother. Cerebrovascular Accident Mother. Mother Deceased, Ovarian Cancer, Heart disease, Breast Cancer. age 71  Social History  Former drinker 05/10/2013: In the past drank Exercise Exercises weekly; does running / walking Current work status retired Tobacco / smoke exposure 05/10/2013: no Tobacco use Former smoker. 05/10/2013: smoke(d) less than 1/2 pack(s) per day Children 1 Marital status married Living situation live with spouse No history of drug/alcohol rehab Number of flights of stairs before winded greater than 5 Not under pain contract Post-Surgical Plans Home Advance Directives Living Will, Healthcare POA  Medication History Potassium Chloride ER (10MEQ Capsule ER, Oral) Active. Triamterene-HCTZ (37.5-25MG  Tablet, Oral) Active. Clobetasol Propionate (0.05% Cream, External as needed) Active. Hydrocortisone Valerate (0.2% Cream, External as needed) Active. MetroNIDAZOLE (0.75% Gel, External as needed) Active. Multivitamins (Oral) Active. Fish Oil (1000MG  Capsule, Oral) Active.  Past Surgical History Cesarean Delivery Date: 791983. 1 time Dilation and Curettage of Uterus  Review of Systems General Not Present- Chills, Fatigue, Fever, Memory Loss, Night Sweats, Weight Gain and Weight Loss. Skin Not Present- Eczema, Hives, Itching, Lesions and Rash. HEENT Not Present- Dentures, Double Vision, Headache, Hearing Loss, Tinnitus and Visual Loss. Respiratory Not Present- Allergies, Chronic Cough, Coughing up blood, Shortness of breath at rest and Shortness of breath  with exertion. Cardiovascular Not Present- Chest Pain, Difficulty Breathing Lying Down, Murmur, Palpitations, Racing/skipping heartbeats and Swelling. Gastrointestinal Not Present- Abdominal Pain, Bloody Stool, Constipation, Diarrhea, Difficulty Swallowing, Heartburn, Jaundice, Loss  of appetitie, Nausea and Vomiting. Female Genitourinary Not Present- Blood in Urine, Discharge, Flank Pain, Incontinence, Painful Urination, Urgency, Urinary frequency, Urinary Retention, Urinating at Night and Weak urinary stream. Musculoskeletal Present- Back Pain and Joint Pain. Not Present- Joint Swelling, Morning Stiffness, Muscle Pain, Muscle Weakness and Spasms. Neurological Not Present- Blackout spells, Difficulty with balance, Dizziness, Paralysis, Tremor and Weakness. Psychiatric Not Present- Insomnia  Vitals Weight: 140 lb Height: 63.75in Body Surface Area: 1.68 m Body Mass Index: 24.22 kg/m  BP: 154/82 (Sitting, Right Arm, Standard)   Physical Exam General Mental Status -Alert, cooperative and good historian. General Appearance-pleasant, Not in acute distress. Orientation-Oriented X3. Build & Nutrition-Well nourished and Well developed.  Head and Neck Head-normocephalic, atraumatic . Neck Global Assessment - supple, no bruit auscultated on the right, no bruit auscultated on the left.  Eye Pupil - Bilateral-Regular and Round. Motion - Bilateral-EOMI.  Chest and Lung Exam Auscultation Breath sounds - clear at anterior chest wall and clear at posterior chest wall. Adventitious sounds - No Adventitious sounds.  Cardiovascular Auscultation Rhythm - Regular rate and rhythm. Heart Sounds - S1 WNL and S2 WNL. Murmurs & Other Heart Sounds - Auscultation of the heart reveals - No Murmurs.  Abdomen Palpation/Percussion Tenderness - Abdomen is non-tender to palpation. Rigidity (guarding) - Abdomen is soft. Auscultation Auscultation of the abdomen reveals - Bowel sounds  normal.  Female Genitourinary Note: Not done, not pertinent to present illness   Musculoskeletal Note: She is alert and oriented. No apparent distress. The right hip has a normal range of motion, no discomfort. The left hip can be flexed to 100. No internal rotation, about 10 external rotation and 10-20 of abduction. She has pain on range of motion. She is nontender over the greater trochanter. Gait pattern is antalgic on the left.  RADIOGRAPHS: AP pelvis and lateral of the left hip show bone on bone arthritis in her left hip with subchondral cystic formation in the femoral head.   Assessment & Plan Primary osteoarthritis of left hip (M16.12) Note:Surgical Plans: Left Total Hip Repalcement - Anterior Approach  Disposition: Home  PCP: Dr. Shaune Pollackonna [name] - Patient has been seen preoperatively and felt to be stable for surgery.  IV TXA  Anesthesia Issues: None  Signed electronically by Lauraine RinneAlexzandrew L Jamesia Linnen, III PA-C

## 2014-06-28 ENCOUNTER — Ambulatory Visit: Payer: Self-pay | Admitting: Orthopedic Surgery

## 2014-07-09 NOTE — Patient Instructions (Addendum)
Connie OrleansVivian D Lindsey  07/09/2014   Your procedure is scheduled on: 07/17/14    Report to [facility] Main  Entrance and follow signs to               Short Stay Center at      0515 AM.  Call this number if you have problems the morning of surgery [phone]   Remember:  Do not eat food or drink liquids :After Midnight.     Take these medicines the morning of surgery with A SIP OF WATER: none                                You may not have any metal on your body including hair pins and              piercings  Do not wear jewelry, make-up, lotions, powders or perfumes., deodorant.               Do not wear nail polish.  Do not shave  48 hours prior to surgery.     Do not bring valuables to the hospital. [place] IS NOT             RESPONSIBLE   FOR VALUABLES.  Contacts, dentures or bridgework may not be worn into surgery.  Leave suitcase in the car. After surgery it may be brought to your room.       :  Special Instructions: coughing and deep breathing exercises, leg exercises               Please read over the following fact sheets you were given: _____________________________________________________________________             [facility] - Preparing for Surgery Before surgery, you can play an important role.  Because skin is not sterile, your skin needs to be as free of germs as possible.  You can reduce the number of germs on your skin by washing with CHG (chlorahexidine gluconate) soap before surgery.  CHG is an antiseptic cleaner which kills germs and bonds with the skin to continue killing germs even after washing. Please DO NOT use if you have an allergy to CHG or antibacterial soaps.  If your skin becomes reddened/irritated stop using the CHG and inform your nurse when you arrive at Short Stay. Do not shave (including legs and underarms) for at least 48 hours prior to the first CHG shower.  You may shave your face/neck. Please follow these  instructions carefully:  1.  Shower with CHG Soap the night before surgery and the  morning of Surgery.  2.  If you choose to wash your hair, wash your hair first as usual with your  normal  shampoo.  3.  After you shampoo, rinse your hair and body thoroughly to remove the  shampoo.                           4.  Use CHG as you would any other liquid soap.  You can apply chg directly  to the skin and wash                       Gently with a scrungie or clean washcloth.  5.  Apply the CHG Soap to your body ONLY FROM  THE NECK DOWN.   Do not use on face/ open                           Wound or open sores. Avoid contact with eyes, ears mouth and genitals (private parts).                       Wash face,  Genitals (private parts) with your normal soap.             6.  Wash thoroughly, paying special attention to the area where your surgery  will be performed.  7.  Thoroughly rinse your body with warm water from the neck down.  8.  DO NOT shower/wash with your normal soap after using and rinsing off  the CHG Soap.                9.  Pat yourself dry with a clean towel.            10.  Wear clean pajamas.            11.  Place clean sheets on your bed the night of your first shower and do not  sleep with pets. Day of Surgery : Do not apply any lotions/deodorants the morning of surgery.  Please wear clean clothes to the hospital/surgery center.  FAILURE TO FOLLOW THESE INSTRUCTIONS MAY RESULT IN THE CANCELLATION OF YOUR SURGERY PATIENT SIGNATURE_________________________________  NURSE SIGNATURE__________________________________  ________________________________________________________________________  WHAT IS A BLOOD TRANSFUSION? Blood Transfusion Information  A transfusion is the replacement of blood or some of its parts. Blood is made up of multiple cells which provide different functions.  Red blood cells carry oxygen and are used for blood loss replacement.  White blood cells fight against  infection.  Platelets control bleeding.  Plasma helps clot blood.  Other blood products are available for specialized needs, such as hemophilia or other clotting disorders. BEFORE THE TRANSFUSION  Who gives blood for transfusions?   Healthy volunteers who are fully evaluated to make sure their blood is safe. This is blood bank blood. Transfusion therapy is the safest it has ever been in the practice of medicine. Before blood is taken from a donor, a complete history is taken to make sure that person has no history of diseases nor engages in risky social behavior (examples are intravenous drug use or sexual activity with multiple partners). The donor's travel history is screened to minimize risk of transmitting infections, such as malaria. The donated blood is tested for signs of infectious diseases, such as HIV and hepatitis. The blood is then tested to be sure it is compatible with you in order to minimize the chance of a transfusion reaction. If you or a relative donates blood, this is often done in anticipation of surgery and is not appropriate for emergency situations. It takes many days to process the donated blood. RISKS AND COMPLICATIONS Although transfusion therapy is very safe and saves many lives, the main dangers of transfusion include:  1. Getting an infectious disease. 2. Developing a transfusion reaction. This is an allergic reaction to something in the blood you were given. Every precaution is taken to prevent this. The decision to have a blood transfusion has been considered carefully by your caregiver before blood is given. Blood is not given unless the benefits outweigh the risks. AFTER THE TRANSFUSION  Right after receiving a blood transfusion, you will usually feel much better and  more energetic. This is especially true if your red blood cells have gotten low (anemic). The transfusion raises the level of the red blood cells which carry oxygen, and this usually causes an energy  increase.  The nurse administering the transfusion will monitor you carefully for complications. HOME CARE INSTRUCTIONS  No special instructions are needed after a transfusion. You may find your energy is better. Speak with your caregiver about any limitations on activity for underlying diseases you may have. SEEK MEDICAL CARE IF:   Your condition is not improving after your transfusion.  You develop redness or irritation at the intravenous (IV) site. SEEK IMMEDIATE MEDICAL CARE IF:  Any of the following symptoms occur over the next 12 hours:  Shaking chills.  You have a temperature by mouth above 102 F (38.9 C), not controlled by medicine.  Chest, back, or muscle pain.  People around you feel you are not acting correctly or are confused.  Shortness of breath or difficulty breathing.  Dizziness and fainting.  You get a rash or develop hives.  You have a decrease in urine output.  Your urine turns a dark color or changes to pink, red, or brown. Any of the following symptoms occur over the next 10 days:  You have a temperature by mouth above 102 F (38.9 C), not controlled by medicine.  Shortness of breath.  Weakness after normal activity.  The white part of the eye turns yellow (jaundice).  You have a decrease in the amount of urine or are urinating less often.  Your urine turns a dark color or changes to pink, red, or brown. Document Released: 05/07/2000 Document Revised: 08/02/2011 Document Reviewed: 12/25/2007 ExitCare Patient Information 2014 [place].  _______________________________________________________________________  Incentive Spirometer  An incentive spirometer is a tool that can help keep your lungs clear and active. This tool measures how well you are filling your lungs with each breath. Taking long deep breaths may help reverse or decrease the chance of developing breathing (pulmonary) problems (especially infection) following:  A long  period of time when you are unable to move or be active. BEFORE THE PROCEDURE   If the spirometer includes an indicator to show your best effort, your nurse or respiratory therapist will set it to a desired goal.  If possible, sit up straight or lean slightly forward. Try not to slouch.  Hold the incentive spirometer in an upright position. INSTRUCTIONS FOR USE  3. Sit on the edge of your bed if possible, or sit up as far as you can in bed or on a chair. 4. Hold the incentive spirometer in an upright position. 5. Breathe out normally. 6. Place the mouthpiece in your mouth and seal your lips tightly around it. 7. Breathe in slowly and as deeply as possible, raising the piston or the ball toward the top of the column. 8. Hold your breath for 3-5 seconds or for as long as possible. Allow the piston or ball to fall to the bottom of the column. 9. Remove the mouthpiece from your mouth and breathe out normally. 10. Rest for a few seconds and repeat Steps 1 through 7 at least 10 times every 1-2 hours when you are awake. Take your time and take a few normal breaths between deep breaths. 11. The spirometer may include an indicator to show your best effort. Use the indicator as a goal to work toward during each repetition. 12. After each set of 10 deep breaths, practice coughing to be sure your lungs  are clear. If you have an incision (the cut made at the time of surgery), support your incision when coughing by placing a pillow or rolled up towels firmly against it. Once you are able to get out of bed, walk around indoors and cough well. You may stop using the incentive spirometer when instructed by your caregiver.  RISKS AND COMPLICATIONS  Take your time so you do not get dizzy or light-headed.  If you are in pain, you may need to take or ask for pain medication before doing incentive spirometry. It is harder to take a deep breath if you are having pain. AFTER USE  Rest and breathe slowly and  easily.  It can be helpful to keep track of a log of your progress. Your caregiver can provide you with a simple table to help with this. If you are using the spirometer at home, follow these instructions: [place] IF:   You are having difficultly using the spirometer.  You have trouble using the spirometer as often as instructed.  Your pain medication is not giving enough relief while using the spirometer.  You develop fever of 100.5 F (38.1 C) or higher. SEEK IMMEDIATE MEDICAL CARE IF:   You cough up bloody sputum that had not been present before.  You develop fever of 102 F (38.9 C) or greater.  You develop worsening pain at or near the incision site. MAKE SURE YOU:   Understand these instructions.  Will watch your condition.  Will get help right away if you are not doing well or get worse. Document Released: 09/20/2006 Document Revised: 08/02/2011 Document Reviewed: 11/21/2006 [facility] Patient Information 2014 [place], [place].   ________________________________________________________________________

## 2014-07-10 ENCOUNTER — Encounter (HOSPITAL_COMMUNITY)
Admission: RE | Admit: 2014-07-10 | Discharge: 2014-07-10 | Disposition: A | Discharge status: Home / Self Care | Payer: Medicare Other | Source: Ambulatory Visit | Attending: Orthopedic Surgery | Admitting: Orthopedic Surgery

## 2014-07-10 ENCOUNTER — Encounter (HOSPITAL_COMMUNITY): Payer: Self-pay

## 2014-07-10 ENCOUNTER — Other Ambulatory Visit: Payer: Self-pay

## 2014-07-10 DIAGNOSIS — Z01818 Encounter for other preprocedural examination: Secondary | ICD-10-CM | POA: Insufficient documentation

## 2014-07-10 DIAGNOSIS — M1612 Unilateral primary osteoarthritis, left hip: Secondary | ICD-10-CM | POA: Insufficient documentation

## 2014-07-10 HISTORY — DX: Gastro-esophageal reflux disease without esophagitis: K21.9

## 2014-07-10 HISTORY — DX: Unspecified osteoarthritis, unspecified site: M19.90

## 2014-07-10 HISTORY — DX: Family history of other specified conditions: Z84.89

## 2014-07-10 HISTORY — DX: Essential (primary) hypertension: I10

## 2014-07-10 LAB — COMPREHENSIVE METABOLIC PANEL
ALBUMIN: 4.5 g/dL (ref 3.5–5.2)
ALK PHOS: 83 U/L (ref 39–117)
ALT: 26 U/L (ref 0–35)
AST: 27 U/L (ref 0–37)
Anion gap: 8 (ref 5–15)
BILIRUBIN TOTAL: 0.7 mg/dL (ref 0.3–1.2)
BUN: 15 mg/dL (ref 6–23)
CHLORIDE: 101 mmol/L (ref 96–112)
CO2: 30 mmol/L (ref 19–32)
Calcium: 9.5 mg/dL (ref 8.4–10.5)
Creatinine, Ser: 0.69 mg/dL (ref 0.50–1.10)
GFR calc Af Amer: >90 mL/min (ref >90)
GFR calc non Af Amer: 86 mL/min — ABNORMAL LOW (ref >90)
Glucose, Bld: 98 mg/dL (ref 70–99)
Potassium: 4.4 mmol/L (ref 3.5–5.1)
Sodium: 139 mmol/L (ref 135–145)
Total Protein: 8.1 g/dL (ref 6.0–8.3)

## 2014-07-10 LAB — CBC
HCT: 42 % (ref 36.0–46.0)
Hemoglobin: 14.7 g/dL (ref 12.0–15.0)
MCH: 32.1 pg (ref 26.0–34.0)
MCHC: 35 g/dL (ref 30.0–36.0)
MCV: 91.7 fL (ref 78.0–100.0)
Platelets: 289 10*3/uL (ref 150–400)
RBC: 4.58 MIL/uL (ref 3.87–5.11)
RDW: 13.9 % (ref 11.5–15.5)
WBC: 7.4 10*3/uL (ref 4.0–10.5)

## 2014-07-10 LAB — URINALYSIS, ROUTINE W REFLEX MICROSCOPIC
BILIRUBIN URINE: NEGATIVE
Glucose, UA: NEGATIVE mg/dL
Hgb urine dipstick: NEGATIVE
Ketones, ur: NEGATIVE mg/dL
Leukocytes, UA: NEGATIVE
NITRITE: NEGATIVE
Protein, ur: NEGATIVE mg/dL
Specific Gravity, Urine: 1.008 (ref 1.005–1.030)
Urobilinogen, UA: 0.2 mg/dL (ref 0.0–1.0)
pH: 7.5 (ref 5.0–8.0)

## 2014-07-10 LAB — PROTIME-INR
INR: 1 (ref 0.00–1.49)
Prothrombin Time: 13.3 seconds (ref 11.6–15.2)

## 2014-07-10 LAB — SURGICAL PCR SCREEN
MRSA, PCR: NEGATIVE
Staphylococcus aureus: NEGATIVE

## 2014-07-10 LAB — APTT: APTT: 34 s (ref 24–37)

## 2014-07-10 NOTE — Progress Notes (Signed)
Penicillins cause nausea per patient.  Ancef ordered preop .  FYI.

## 2014-07-12 NOTE — Progress Notes (Signed)
Requested most recent ekg from office of Dr Shaune Pollackonna [name] be faxed to [phone].

## 2014-07-15 NOTE — Progress Notes (Signed)
Final EKG done 07/10/14 in EPIC and on chart.

## 2014-07-17 ENCOUNTER — Inpatient Hospital Stay (HOSPITAL_COMMUNITY): Payer: Medicare Other | Admitting: Anesthesiology

## 2014-07-17 ENCOUNTER — Inpatient Hospital Stay (HOSPITAL_COMMUNITY): Payer: Medicare Other

## 2014-07-17 ENCOUNTER — Encounter (HOSPITAL_COMMUNITY)
Admission: RE | Disposition: A | Discharge status: Home Health | Payer: Self-pay | Source: Ambulatory Visit | Attending: Orthopedic Surgery

## 2014-07-17 ENCOUNTER — Inpatient Hospital Stay (HOSPITAL_COMMUNITY)
Admission: RE | Admit: 2014-07-17 | Discharge: 2014-07-19 | DRG: 470 | Disposition: A | Discharge status: Home Health | Payer: Medicare Other | Source: Ambulatory Visit | Attending: Orthopedic Surgery | Admitting: Orthopedic Surgery

## 2014-07-17 ENCOUNTER — Encounter (HOSPITAL_COMMUNITY): Payer: Self-pay | Admitting: *Deleted

## 2014-07-17 DIAGNOSIS — K219 Gastro-esophageal reflux disease without esophagitis: Secondary | ICD-10-CM | POA: Diagnosis present

## 2014-07-17 DIAGNOSIS — M169 Osteoarthritis of hip, unspecified: Secondary | ICD-10-CM | POA: Diagnosis present

## 2014-07-17 DIAGNOSIS — M25552 Pain in left hip: Secondary | ICD-10-CM | POA: Diagnosis present

## 2014-07-17 DIAGNOSIS — Z87891 Personal history of nicotine dependence: Secondary | ICD-10-CM

## 2014-07-17 DIAGNOSIS — Z79899 Other long term (current) drug therapy: Secondary | ICD-10-CM | POA: Diagnosis not present

## 2014-07-17 DIAGNOSIS — Z01812 Encounter for preprocedural laboratory examination: Secondary | ICD-10-CM | POA: Diagnosis not present

## 2014-07-17 DIAGNOSIS — M1612 Unilateral primary osteoarthritis, left hip: Secondary | ICD-10-CM | POA: Diagnosis present

## 2014-07-17 DIAGNOSIS — I10 Essential (primary) hypertension: Secondary | ICD-10-CM | POA: Diagnosis present

## 2014-07-17 DIAGNOSIS — Z96649 Presence of unspecified artificial hip joint: Secondary | ICD-10-CM

## 2014-07-17 HISTORY — PX: TOTAL HIP ARTHROPLASTY: SHX124

## 2014-07-17 LAB — TYPE AND SCREEN
ABO/RH(D): O POS
Antibody Screen: NEGATIVE

## 2014-07-17 LAB — ABO/RH: ABO/RH(D): O POS

## 2014-07-17 SURGERY — ARTHROPLASTY, HIP, TOTAL, ANTERIOR APPROACH
Anesthesia: Spinal | Laterality: Left

## 2014-07-17 MED ORDER — DEXAMETHASONE SODIUM PHOSPHATE 10 MG/ML IJ SOLN
10.0000 mg | Freq: Once | INTRAMUSCULAR | Status: AC
  Administered 2014-07-18: 10 mg via INTRAVENOUS
  Filled 2014-07-17: qty 1

## 2014-07-17 MED ORDER — SODIUM CHLORIDE 0.9 % IV SOLN: INTRAVENOUS | Status: DC

## 2014-07-17 MED ORDER — POLYETHYLENE GLYCOL 3350 17 G PO PACK: 17.0000 g | PACK | Freq: Every day | ORAL | Status: DC | PRN

## 2014-07-17 MED ORDER — DOCUSATE SODIUM 100 MG PO CAPS
100.0000 mg | ORAL_CAPSULE | Freq: Two times a day (BID) | ORAL | Status: DC
  Administered 2014-07-18 – 2014-07-19 (×3): 100 mg via ORAL

## 2014-07-17 MED ORDER — MEPERIDINE HCL 50 MG/ML IJ SOLN: 6.2500 mg | INTRAMUSCULAR | Status: DC | PRN

## 2014-07-17 MED ORDER — TRIAMTERENE-HCTZ 37.5-25 MG PO CAPS
1.0000 | ORAL_CAPSULE | Freq: Every morning | ORAL | Status: DC
  Filled 2014-07-17: qty 1

## 2014-07-17 MED ORDER — ACETAMINOPHEN 10 MG/ML IV SOLN
1000.0000 mg | Freq: Once | INTRAVENOUS | Status: AC
  Administered 2014-07-17: 1000 mg via INTRAVENOUS
  Filled 2014-07-17: qty 100

## 2014-07-17 MED ORDER — FENTANYL CITRATE 0.05 MG/ML IJ SOLN
INTRAMUSCULAR | Status: AC
  Filled 2014-07-17: qty 2

## 2014-07-17 MED ORDER — FENTANYL CITRATE 0.05 MG/ML IJ SOLN: 25.0000 ug | INTRAMUSCULAR | Status: DC | PRN

## 2014-07-17 MED ORDER — METHOCARBAMOL 500 MG PO TABS: 500.0000 mg | ORAL_TABLET | Freq: Four times a day (QID) | ORAL | Status: DC | PRN

## 2014-07-17 MED ORDER — PROPOFOL 10 MG/ML IV BOLUS
INTRAVENOUS | Status: AC
  Filled 2014-07-17: qty 20

## 2014-07-17 MED ORDER — OXYCODONE HCL 5 MG PO TABS: 5.0000 mg | ORAL_TABLET | ORAL | Status: DC | PRN

## 2014-07-17 MED ORDER — SODIUM CHLORIDE 0.9 % IV SOLN
1000.0000 mg | INTRAVENOUS | Status: AC
  Administered 2014-07-17: 1000 mg via INTRAVENOUS
  Filled 2014-07-17: qty 10

## 2014-07-17 MED ORDER — BUPIVACAINE HCL (PF) 0.5 % IJ SOLN
INTRAMUSCULAR | Status: DC | PRN
  Administered 2014-07-17: 3 mL

## 2014-07-17 MED ORDER — LACTATED RINGERS IV SOLN: INTRAVENOUS | Status: DC

## 2014-07-17 MED ORDER — PHENOL 1.4 % MT LIQD
1.0000 | OROMUCOSAL | Status: DC | PRN
Start: 2014-07-17 — End: 2014-07-19

## 2014-07-17 MED ORDER — LACTATED RINGERS IV SOLN
INTRAVENOUS | Status: DC
Start: 2014-07-17 — End: 2014-07-17

## 2014-07-17 MED ORDER — METHOCARBAMOL 1000 MG/10ML IJ SOLN
500.0000 mg | Freq: Four times a day (QID) | INTRAVENOUS | Status: DC | PRN
  Filled 2014-07-17: qty 5

## 2014-07-17 MED ORDER — FLEET ENEMA 7-19 GM/118ML RE ENEM: 1.0000 | ENEMA | Freq: Once | RECTAL | Status: AC | PRN

## 2014-07-17 MED ORDER — CHLORHEXIDINE GLUCONATE 4 % EX LIQD: 60.0000 mL | Freq: Once | CUTANEOUS | Status: DC

## 2014-07-17 MED ORDER — DEXTROSE-NACL 5-0.9 % IV SOLN
INTRAVENOUS | Status: DC
  Administered 2014-07-17 – 2014-07-18 (×2): via INTRAVENOUS

## 2014-07-17 MED ORDER — PROMETHAZINE HCL 25 MG/ML IJ SOLN: 6.2500 mg | INTRAMUSCULAR | Status: DC | PRN

## 2014-07-17 MED ORDER — DEXAMETHASONE SODIUM PHOSPHATE 10 MG/ML IJ SOLN
INTRAMUSCULAR | Status: AC
  Filled 2014-07-17: qty 1

## 2014-07-17 MED ORDER — MORPHINE SULFATE 2 MG/ML IJ SOLN: 1.0000 mg | INTRAMUSCULAR | Status: DC | PRN

## 2014-07-17 MED ORDER — ACETAMINOPHEN 500 MG PO TABS
1000.0000 mg | ORAL_TABLET | Freq: Four times a day (QID) | ORAL | Status: AC
  Administered 2014-07-17 – 2014-07-18 (×4): 1000 mg via ORAL
  Filled 2014-07-17 (×4): qty 2

## 2014-07-17 MED ORDER — VALACYCLOVIR HCL 500 MG PO TABS
500.0000 mg | ORAL_TABLET | Freq: Every day | ORAL | Status: DC
  Administered 2014-07-17 – 2014-07-18 (×2): 500 mg via ORAL
  Filled 2014-07-17 (×3): qty 1

## 2014-07-17 MED ORDER — TRAMADOL HCL 50 MG PO TABS
50.0000 mg | ORAL_TABLET | Freq: Four times a day (QID) | ORAL | Status: DC | PRN
  Administered 2014-07-17 – 2014-07-18 (×2): 100 mg via ORAL
  Filled 2014-07-17 (×2): qty 2

## 2014-07-17 MED ORDER — MIDAZOLAM HCL 2 MG/2ML IJ SOLN
INTRAMUSCULAR | Status: AC
  Filled 2014-07-17: qty 2

## 2014-07-17 MED ORDER — FENTANYL CITRATE 0.05 MG/ML IJ SOLN
INTRAMUSCULAR | Status: DC | PRN
  Administered 2014-07-17: 50 ug via INTRAVENOUS

## 2014-07-17 MED ORDER — ACETAMINOPHEN 650 MG RE SUPP: 650.0000 mg | Freq: Four times a day (QID) | RECTAL | Status: DC | PRN

## 2014-07-17 MED ORDER — DIPHENHYDRAMINE HCL 12.5 MG/5ML PO ELIX: 12.5000 mg | ORAL_SOLUTION | ORAL | Status: DC | PRN

## 2014-07-17 MED ORDER — BISACODYL 10 MG RE SUPP: 10.0000 mg | Freq: Every day | RECTAL | Status: DC | PRN

## 2014-07-17 MED ORDER — BUPIVACAINE LIPOSOME 1.3 % IJ SUSP
20.0000 mL | Freq: Once | INTRAMUSCULAR | Status: DC
  Filled 2014-07-17: qty 20

## 2014-07-17 MED ORDER — TRIAMTERENE-HCTZ 37.5-25 MG PO TABS
1.0000 | ORAL_TABLET | Freq: Every day | ORAL | Status: DC
  Administered 2014-07-17 – 2014-07-19 (×3): 1 via ORAL
  Filled 2014-07-17 (×4): qty 1

## 2014-07-17 MED ORDER — BUPIVACAINE HCL (PF) 0.5 % IJ SOLN
INTRAMUSCULAR | Status: AC
  Filled 2014-07-17: qty 30

## 2014-07-17 MED ORDER — LACTATED RINGERS IV SOLN
INTRAVENOUS | Status: DC | PRN
  Administered 2014-07-17 (×4): via INTRAVENOUS

## 2014-07-17 MED ORDER — SODIUM CHLORIDE 0.9 % IJ SOLN
INTRAMUSCULAR | Status: AC
  Filled 2014-07-17: qty 50

## 2014-07-17 MED ORDER — CEFAZOLIN SODIUM 1-5 GM-% IV SOLN
1.0000 g | Freq: Four times a day (QID) | INTRAVENOUS | Status: AC
  Administered 2014-07-17 (×2): 1 g via INTRAVENOUS
  Filled 2014-07-17 (×2): qty 50

## 2014-07-17 MED ORDER — KETOROLAC TROMETHAMINE 15 MG/ML IJ SOLN: 7.5000 mg | Freq: Four times a day (QID) | INTRAMUSCULAR | Status: AC | PRN

## 2014-07-17 MED ORDER — RIVAROXABAN 10 MG PO TABS
10.0000 mg | ORAL_TABLET | Freq: Every day | ORAL | Status: DC
  Administered 2014-07-18 – 2014-07-19 (×2): 10 mg via ORAL
  Filled 2014-07-17 (×3): qty 1

## 2014-07-17 MED ORDER — PROPOFOL 10 MG/ML IV BOLUS
INTRAVENOUS | Status: DC | PRN
  Administered 2014-07-17: 30 mg via INTRAVENOUS

## 2014-07-17 MED ORDER — CEFAZOLIN SODIUM-DEXTROSE 2-3 GM-% IV SOLR
2.0000 g | INTRAVENOUS | Status: AC
  Administered 2014-07-17: 2 g via INTRAVENOUS

## 2014-07-17 MED ORDER — PHENYLEPHRINE HCL 10 MG/ML IJ SOLN
INTRAMUSCULAR | Status: DC | PRN
  Administered 2014-07-17 (×2): 120 ug via INTRAVENOUS

## 2014-07-17 MED ORDER — BUPIVACAINE LIPOSOME 1.3 % IJ SUSP
INTRAMUSCULAR | Status: DC | PRN
  Administered 2014-07-17: 20 mL

## 2014-07-17 MED ORDER — BUPIVACAINE HCL (PF) 0.25 % IJ SOLN
INTRAMUSCULAR | Status: AC
  Filled 2014-07-17: qty 30

## 2014-07-17 MED ORDER — METOCLOPRAMIDE HCL 10 MG PO TABS: 5.0000 mg | ORAL_TABLET | Freq: Three times a day (TID) | ORAL | Status: DC | PRN

## 2014-07-17 MED ORDER — DEXAMETHASONE SODIUM PHOSPHATE 10 MG/ML IJ SOLN
10.0000 mg | Freq: Once | INTRAMUSCULAR | Status: AC
  Administered 2014-07-17: 10 mg via INTRAVENOUS

## 2014-07-17 MED ORDER — METOCLOPRAMIDE HCL 5 MG/ML IJ SOLN: 5.0000 mg | Freq: Three times a day (TID) | INTRAMUSCULAR | Status: DC | PRN

## 2014-07-17 MED ORDER — POTASSIUM CHLORIDE CRYS ER 20 MEQ PO TBCR
20.0000 meq | EXTENDED_RELEASE_TABLET | Freq: Two times a day (BID) | ORAL | Status: DC
  Administered 2014-07-17 – 2014-07-19 (×4): 20 meq via ORAL
  Filled 2014-07-17 (×6): qty 1

## 2014-07-17 MED ORDER — PROPOFOL INFUSION 10 MG/ML OPTIME
INTRAVENOUS | Status: DC | PRN
  Administered 2014-07-17: 160 ug/kg/min via INTRAVENOUS

## 2014-07-17 MED ORDER — PHENYLEPHRINE 40 MCG/ML (10ML) SYRINGE FOR IV PUSH (FOR BLOOD PRESSURE SUPPORT)
PREFILLED_SYRINGE | INTRAVENOUS | Status: AC
  Filled 2014-07-17: qty 10

## 2014-07-17 MED ORDER — ONDANSETRON HCL 4 MG PO TABS: 4.0000 mg | ORAL_TABLET | Freq: Four times a day (QID) | ORAL | Status: DC | PRN

## 2014-07-17 MED ORDER — ONDANSETRON HCL 4 MG/2ML IJ SOLN
4.0000 mg | Freq: Four times a day (QID) | INTRAMUSCULAR | Status: DC | PRN
  Administered 2014-07-18: 4 mg via INTRAVENOUS
  Filled 2014-07-17: qty 2

## 2014-07-17 MED ORDER — ACETAMINOPHEN 325 MG PO TABS
650.0000 mg | ORAL_TABLET | Freq: Four times a day (QID) | ORAL | Status: DC | PRN
Start: 2014-07-18 — End: 2014-07-19
  Administered 2014-07-18: 650 mg via ORAL

## 2014-07-17 MED ORDER — SODIUM CHLORIDE 0.9 % IJ SOLN
INTRAMUSCULAR | Status: DC | PRN
  Administered 2014-07-17: 30 mL via INTRAVENOUS

## 2014-07-17 MED ORDER — MENTHOL 3 MG MT LOZG: 1.0000 | LOZENGE | OROMUCOSAL | Status: DC | PRN

## 2014-07-17 MED ORDER — BUPIVACAINE HCL (PF) 0.25 % IJ SOLN
INTRAMUSCULAR | Status: DC | PRN
  Administered 2014-07-17: 30 mL

## 2014-07-17 MED ORDER — MIDAZOLAM HCL 5 MG/5ML IJ SOLN
INTRAMUSCULAR | Status: DC | PRN
  Administered 2014-07-17: 2 mg via INTRAVENOUS

## 2014-07-17 MED ORDER — CEFAZOLIN SODIUM-DEXTROSE 2-3 GM-% IV SOLR
INTRAVENOUS | Status: AC
  Filled 2014-07-17: qty 50

## 2014-07-17 MED ORDER — 0.9 % SODIUM CHLORIDE (POUR BTL) OPTIME
TOPICAL | Status: DC | PRN
  Administered 2014-07-17: 1000 mL

## 2014-07-17 SURGICAL SUPPLY — 40 items
BAG SPEC THK2 15X12 ZIP CLS (MISCELLANEOUS)
BAG ZIPLOCK 12X15 (MISCELLANEOUS) IMPLANT
BLADE EXTENDED COATED 6.5IN (ELECTRODE) ×2 IMPLANT
BLADE SAG 18X100X1.27 (BLADE) ×2 IMPLANT
CAPT HIP TOTAL 2 ×1 IMPLANT
COVER PERINEAL POST (MISCELLANEOUS) ×2 IMPLANT
DECANTER SPIKE VIAL GLASS SM (MISCELLANEOUS) ×2 IMPLANT
DRAPE C-ARM 42X120 X-RAY (DRAPES) ×2 IMPLANT
DRAPE STERI IOBAN 125X83 (DRAPES) ×2 IMPLANT
DRAPE U-SHAPE 47X51 STRL (DRAPES) ×6 IMPLANT
DRSG ADAPTIC 3X8 NADH LF (GAUZE/BANDAGES/DRESSINGS) ×2 IMPLANT
DRSG MEPILEX BORDER 4X4 (GAUZE/BANDAGES/DRESSINGS) ×2 IMPLANT
DRSG MEPILEX BORDER 4X8 (GAUZE/BANDAGES/DRESSINGS) ×2 IMPLANT
DURAPREP 26ML APPLICATOR (WOUND CARE) ×2 IMPLANT
ELECT REM PT RETURN 9FT ADLT (ELECTROSURGICAL) ×2
ELECTRODE REM PT RTRN 9FT ADLT (ELECTROSURGICAL) ×1 IMPLANT
EVACUATOR 1/8 PVC DRAIN (DRAIN) ×2 IMPLANT
FACESHIELD WRAPAROUND (MASK) ×8 IMPLANT
GLOVE BIO SURGEON STRL SZ7.5 (GLOVE) ×2 IMPLANT
GLOVE BIO SURGEON STRL SZ8 (GLOVE) ×4 IMPLANT
GLOVE BIOGEL PI IND STRL 8 (GLOVE) ×2 IMPLANT
GLOVE BIOGEL PI INDICATOR 8 (GLOVE) ×2
GOWN STRL REUS W/TWL LRG LVL3 (GOWN DISPOSABLE) ×2 IMPLANT
GOWN STRL REUS W/TWL XL LVL3 (GOWN DISPOSABLE) ×2 IMPLANT
KIT BASIN OR (CUSTOM PROCEDURE TRAY) ×2 IMPLANT
NDL SAFETY ECLIPSE 18X1.5 (NEEDLE) ×2 IMPLANT
NEEDLE HYPO 18GX1.5 SHARP (NEEDLE) ×4
PACK TOTAL JOINT (CUSTOM PROCEDURE TRAY) ×2 IMPLANT
PEN SKIN MARKING BROAD (MISCELLANEOUS) ×2 IMPLANT
STRIP CLOSURE SKIN 1/2X4 (GAUZE/BANDAGES/DRESSINGS) ×2 IMPLANT
SUT ETHIBOND NAB CT1 #1 30IN (SUTURE) ×2 IMPLANT
SUT MNCRL AB 4-0 PS2 18 (SUTURE) ×2 IMPLANT
SUT VIC AB 2-0 CT1 27 (SUTURE) ×4
SUT VIC AB 2-0 CT1 TAPERPNT 27 (SUTURE) ×2 IMPLANT
SUT VLOC 180 0 24IN GS25 (SUTURE) ×2 IMPLANT
SYR 20CC LL (SYRINGE) ×2 IMPLANT
SYR 50ML LL SCALE MARK (SYRINGE) ×2 IMPLANT
TOWEL OR 17X26 10 PK STRL BLUE (TOWEL DISPOSABLE) ×2 IMPLANT
TOWEL OR NON WOVEN STRL DISP B (DISPOSABLE) IMPLANT
TRAY FOLEY CATH 14FRSI W/METER (CATHETERS) ×2 IMPLANT

## 2014-07-17 NOTE — Anesthesia Preprocedure Evaluation (Addendum)
Anesthesia Evaluation  Patient identified by MRN, date of birth, ID band Patient awake    Reviewed: Allergy & Precautions, NPO status , Patient's Chart, lab work & pertinent test results  Airway Mallampati: II  TM Distance: >3 FB Neck ROM: Full    Dental no notable dental hx.    Pulmonary neg pulmonary ROS, former smoker,  breath sounds clear to auscultation  Pulmonary exam normal       Cardiovascular hypertension, Rhythm:Regular Rate:Normal     Neuro/Psych negative neurological ROS  negative psych ROS   GI/Hepatic negative GI ROS, Neg liver ROS,   Endo/Other  negative endocrine ROS  Renal/GU negative Renal ROS  negative genitourinary   Musculoskeletal negative musculoskeletal ROS (+)   Abdominal   Peds negative pediatric ROS (+)  Hematology negative hematology ROS (+)   Anesthesia Other Findings   Reproductive/Obstetrics negative OB ROS                            Anesthesia Physical Anesthesia Plan  ASA: II  Anesthesia Plan: Spinal   Post-op Pain Management:    Induction:   Airway Management Planned: Simple Face Mask  Additional Equipment:   Intra-op Plan:   Post-operative Plan:   Informed Consent: I have reviewed the patients History and Physical, chart, labs and discussed the procedure including the risks, benefits and alternatives for the proposed anesthesia with the patient or authorized representative who has indicated his/her understanding and acceptance.   Dental advisory given  Plan Discussed with: CRNA  Anesthesia Plan Comments:         Anesthesia Quick Evaluation

## 2014-07-17 NOTE — Transfer of Care (Signed)
Immediate Anesthesia Transfer of Care Note  Patient: Connie Lindsey  Procedure(s) Performed: Procedure(s): LEFT TOTAL HIP ARTHROPLASTY ANTERIOR APPROACH (Left)  Patient Location: PACU  Anesthesia Type:Spinal  Level of Consciousness: awake, sedated, patient cooperative and responds to stimulation  Airway & Oxygen Therapy: Patient Spontanous Breathing and Patient connected to face mask oxygen  Post-op Assessment: Report given to RN, Post -op Vital signs reviewed and stable and SAB Level T12.  Post vital signs: Reviewed and stable  Last Vitals:  Filed Vitals:   07/17/14 0530  BP: 138/84  Pulse: 78  Temp: 36.8 C  Resp: 18    Complications: No apparent anesthesia complications

## 2014-07-17 NOTE — Progress Notes (Signed)
Pt's spinal level L2; she is able to flex her upper thigh muscles and has sensation mid thigh; she is unable to bend her knee or move her toes; Dr. Acey Lavarignan notified and OK to go to pt's inpt hospital room and observe for continue progression of spinal

## 2014-07-17 NOTE — Interval H&P Note (Signed)
History and Physical Interval Note:  07/17/2014 7:09 AM  Connie Lindsey  has presented today for surgery, with the diagnosis of LEFT HIP OA  The various methods of treatment have been discussed with the patient and family. After consideration of risks, benefits and other options for treatment, the patient has consented to  Procedure(s): LEFT TOTAL HIP ARTHROPLASTY ANTERIOR APPROACH (Left) as a surgical intervention .  The patient's history has been reviewed, patient examined, no change in status, stable for surgery.  I have reviewed the patient's chart and labs.  Questions were answered to the patient's satisfaction.     Loanne DrillingALUISIO,Joretta Eads V

## 2014-07-17 NOTE — Anesthesia Postprocedure Evaluation (Signed)
  Anesthesia Post-op Note  Patient: Connie Lindsey  Procedure(s) Performed: Procedure(s) (LRB): LEFT TOTAL HIP ARTHROPLASTY ANTERIOR APPROACH (Left)  Patient Location: PACU  Anesthesia Type: Spinal  Level of Consciousness: awake and alert   Airway and Oxygen Therapy: Patient Spontanous Breathing  Post-op Pain: mild  Post-op Assessment: Post-op Vital signs reviewed, Patient's Cardiovascular Status Stable, Respiratory Function Stable, Patent Airway and No signs of Nausea or vomiting  Last Vitals:  Filed Vitals:   07/17/14 1000  BP: 165/91  Pulse: 71  Temp:   Resp: 20    Post-op Vital Signs: stable   Complications: No apparent anesthesia complications

## 2014-07-17 NOTE — H&P (View-Only) (Signed)
Connie Lindsey DOB: [birth year] Married / Language: English / Race: WBaird [place]onshite Female Date of Admission:  07/17/2014 CC:  Left Hip Pain History of Present Illness  The patient is a 69 year old female who comes in for a preoperative History and Physical. The patient is scheduled for a left total hip arthroplasty (anterior approach) to be performed by Dr. Gus RankinFrank V. Aluisio, [facility] on 07-17-2014. The patient is a 69 year old female who presents  for follow up of their hip. The patient is being followed for their left hip pain and osteoarthritis. They are 4 month(s) (1/2) out from last visit. Symptoms reported today include: pain. The patient feels that they are doing poorly. The following medication has been used for pain control: none. The patient indicates that they have questions or concerns today regarding surgery. Unfortunately, her hip has gotten progressively worse over the past year. She is having a much harder time getting around now. She is losing more mobility. She is having a harder time doing things she desires. She states she still is having discomfort in the left hip and does have some difficulty with certain bending motions. She is ready to proceed with the hip replacement.  They have been treated conservatively in the past for the above stated problem and despite conservative measures, they continue to have progressive pain and severe functional limitations and dysfunction. They have failed non-operative management including home exercise, medications. It is felt that they would benefit from undergoing total joint replacement. Risks and benefits of the procedure have been discussed with the patient and they elect to proceed with surgery. There are no active contraindications to surgery such as ongoing infection or rapidly progressive neurological disease.  Problem List/Past Medical Primary osteoarthritis of left hip (M16.12) Hip osteoarthritis (M16.9) High blood  pressure Osteoarthritis Gastroesophageal Reflux Disease Tinnitus Shingles Urinary Incontinence Stress Osteopenia Menopause Diverticulosis  Allergies: NO KNOWN DRUG ALLERGIES   Intolerances: Codeine Sulfate *ANALGESICS - OPIOID* Nausea. Sulfa drugs12/22/1999 Nausea. Benadryl *ANTIHISTAMINES* Nausea. Penicillins Nausea.  Family History Hypertension Mother. Cancer Mother. Cerebrovascular Accident Mother. Mother Deceased, Ovarian Cancer, Heart disease, Breast Cancer. age 71  Social History  Former drinker 05/10/2013: In the past drank Exercise Exercises weekly; does running / walking Current work status retired Tobacco / smoke exposure 05/10/2013: no Tobacco use Former smoker. 05/10/2013: smoke(d) less than 1/2 pack(s) per day Children 1 Marital status married Living situation live with spouse No history of drug/alcohol rehab Number of flights of stairs before winded greater than 5 Not under pain contract Post-Surgical Plans Home Advance Directives Living Will, Healthcare POA  Medication History Potassium Chloride ER (10MEQ Capsule ER, Oral) Active. Triamterene-HCTZ (37.5-25MG  Tablet, Oral) Active. Clobetasol Propionate (0.05% Cream, External as needed) Active. Hydrocortisone Valerate (0.2% Cream, External as needed) Active. MetroNIDAZOLE (0.75% Gel, External as needed) Active. Multivitamins (Oral) Active. Fish Oil (1000MG  Capsule, Oral) Active.  Past Surgical History Cesarean Delivery Date: 791983. 1 time Dilation and Curettage of Uterus  Review of Systems General Not Present- Chills, Fatigue, Fever, Memory Loss, Night Sweats, Weight Gain and Weight Loss. Skin Not Present- Eczema, Hives, Itching, Lesions and Rash. HEENT Not Present- Dentures, Double Vision, Headache, Hearing Loss, Tinnitus and Visual Loss. Respiratory Not Present- Allergies, Chronic Cough, Coughing up blood, Shortness of breath at rest and Shortness of breath  with exertion. Cardiovascular Not Present- Chest Pain, Difficulty Breathing Lying Down, Murmur, Palpitations, Racing/skipping heartbeats and Swelling. Gastrointestinal Not Present- Abdominal Pain, Bloody Stool, Constipation, Diarrhea, Difficulty Swallowing, Heartburn, Jaundice, Loss  of appetitie, Nausea and Vomiting. Female Genitourinary Not Present- Blood in Urine, Discharge, Flank Pain, Incontinence, Painful Urination, Urgency, Urinary frequency, Urinary Retention, Urinating at Night and Weak urinary stream. Musculoskeletal Present- Back Pain and Joint Pain. Not Present- Joint Swelling, Morning Stiffness, Muscle Pain, Muscle Weakness and Spasms. Neurological Not Present- Blackout spells, Difficulty with balance, Dizziness, Paralysis, Tremor and Weakness. Psychiatric Not Present- Insomnia  Vitals Weight: 140 lb Height: 63.75in Body Surface Area: 1.68 m Body Mass Index: 24.22 kg/m  BP: 154/82 (Sitting, Right Arm, Standard)   Physical Exam General Mental Status -Alert, cooperative and good historian. General Appearance-pleasant, Not in acute distress. Orientation-Oriented X3. Build & Nutrition-Well nourished and Well developed.  Head and Neck Head-normocephalic, atraumatic . Neck Global Assessment - supple, no bruit auscultated on the right, no bruit auscultated on the left.  Eye Pupil - Bilateral-Regular and Round. Motion - Bilateral-EOMI.  Chest and Lung Exam Auscultation Breath sounds - clear at anterior chest wall and clear at posterior chest wall. Adventitious sounds - No Adventitious sounds.  Cardiovascular Auscultation Rhythm - Regular rate and rhythm. Heart Sounds - S1 WNL and S2 WNL. Murmurs & Other Heart Sounds - Auscultation of the heart reveals - No Murmurs.  Abdomen Palpation/Percussion Tenderness - Abdomen is non-tender to palpation. Rigidity (guarding) - Abdomen is soft. Auscultation Auscultation of the abdomen reveals - Bowel sounds  normal.  Female Genitourinary Note: Not done, not pertinent to present illness   Musculoskeletal Note: She is alert and oriented. No apparent distress. The right hip has a normal range of motion, no discomfort. The left hip can be flexed to 100. No internal rotation, about 10 external rotation and 10-20 of abduction. She has pain on range of motion. She is nontender over the greater trochanter. Gait pattern is antalgic on the left.  RADIOGRAPHS: AP pelvis and lateral of the left hip show bone on bone arthritis in her left hip with subchondral cystic formation in the femoral head.   Assessment & Plan Primary osteoarthritis of left hip (M16.12) Note:Surgical Plans: Left Total Hip Repalcement - Anterior Approach  Disposition: Home  PCP: Dr. Shaune Pollackonna [name] - Patient has been seen preoperatively and felt to be stable for surgery.  IV TXA  Anesthesia Issues: None  Signed electronically by Lauraine RinneAlexzandrew L Jabir Dahlem, III PA-C

## 2014-07-17 NOTE — Progress Notes (Signed)
Utilization review completed.  

## 2014-07-17 NOTE — Anesthesia Postprocedure Evaluation (Signed)
  Anesthesia Post-op Note  Patient: Connie Lindsey  Procedure(s) Performed: Procedure(s) (LRB): LEFT TOTAL HIP ARTHROPLASTY ANTERIOR APPROACH (Left)  Patient Location: PACU  Anesthesia Type: Spinal  Level of Consciousness: awake and alert   Airway and Oxygen Therapy: Patient Spontanous Breathing  Post-op Pain: mild  Post-op Assessment: Post-op Vital signs reviewed, Patient's Cardiovascular Status Stable, Respiratory Function Stable, Patent Airway and No signs of Nausea or vomiting  Last Vitals:  Filed Vitals:   07/17/14 1300  BP: 170/82  Pulse: 88  Temp: 36.4 C  Resp: 16    Post-op Vital Signs: stable   Complications: No apparent anesthesia complications

## 2014-07-17 NOTE — Evaluation (Signed)
Physical Therapy Evaluation Patient Details Name: Connie Lindsey MRN: [id number] DOB: 02/10/1946 Today's Date: 07/17/2014   History of Present Illness  L THR  Clinical Impression  Pt s/p L THR presents with decreased L LE strength/ROM and post op pain limiting functional mobility.  Pt should progress well to dc home with family assist and HHPT follow up.    Follow Up Recommendations Home health PT    Equipment Recommendations  None recommended by PT    Recommendations for Other Services OT consult     Precautions / Restrictions Precautions Precautions: Fall Restrictions Weight Bearing Restrictions: No Other Position/Activity Restrictions: WBAT      Mobility  Bed Mobility Overal bed mobility: Needs Assistance Bed Mobility: Supine to Sit     Supine to sit: Min assist;Mod assist     General bed mobility comments: cues for sequence and use of UEs to self assist  Transfers Overall transfer level: Needs assistance Equipment used: Rolling walker (2 wheeled) Transfers: Sit to/from Stand Sit to Stand: Min assist;Mod assist         General transfer comment: cues for LE management and use of UEs to self assist  Ambulation/Gait Ambulation/Gait assistance: Min assist Ambulation Distance (Feet): 90 Feet Assistive device: Rolling walker (2 wheeled) Gait Pattern/deviations: Step-to pattern;Shuffle;Decreased step length - right;Decreased step length - left;Trunk flexed     General Gait Details: cues for sequence, posture and position from [employer]            Wheelchair Mobility    Modified Rankin (Stroke Patients Only)       Balance                                             Pertinent Vitals/Pain Pain Assessment: 0-10 Pain Score: 4  Pain Location: L hip Pain Descriptors / Indicators: Aching;Sore Pain Intervention(s): Limited activity within patient's tolerance;Monitored during session;Premedicated before session;Ice applied     Home Living Family/patient expects to be discharged to:: Private residence Living Arrangements: Spouse/significant other Available Help at Discharge: Family Type of Home: House       Home Layout: Able to live on main level with bedroom/bathroom Home Equipment: Walker - 2 wheels      Prior Function Level of Independence: Independent               Hand Dominance   Dominant Hand: Right    Extremity/Trunk Assessment   Upper Extremity Assessment: Overall WFL for tasks assessed           Lower Extremity Assessment: LLE deficits/detail   LLE Deficits / Details: 2+/5 hip strength with AAROM at hip to 90 flex and 15 abd  Cervical / Trunk Assessment: Normal  Communication   Communication: No difficulties  Cognition Arousal/Alertness: Awake/alert Behavior During Therapy: WFL for tasks assessed/performed Overall Cognitive Status: Within Functional Limits for tasks assessed                      General Comments      Exercises Total Joint Exercises Ankle Circles/Pumps: AROM;Both;15 reps;Supine Quad Sets: AROM;Both;10 reps;Supine Heel Slides: AAROM;Left;15 reps;Other reps (comment) Hip ABduction/ADduction: AAROM;Left;10 reps;Supine      Assessment/Plan    PT Assessment Patient needs continued PT services  PT Diagnosis Difficulty walking   PT Problem List Decreased strength;Decreased range of motion;Decreased activity tolerance;Decreased mobility;Decreased knowledge of use of  DME;Pain  PT Treatment Interventions DME instruction;Gait training;Stair training;Functional mobility training;Therapeutic activities;Therapeutic exercise;Patient/family education   PT Goals (Current goals can be found in the Care Plan section) Acute Rehab PT Goals Patient Stated Goal: Resume previous lifestyle with decreased pain PT Goal Formulation: With patient Time For Goal Achievement: 07/31/14 Potential to Achieve Goals: Good    Frequency 7X/week   Barriers to  discharge        Co-evaluation               End of Session Equipment Utilized During Treatment: Gait belt Activity Tolerance: Patient tolerated treatment well Patient left: in chair;with call bell/phone within reach Nurse Communication: Mobility status         Time: 1450-1529 PT Time Calculation (min) (ACUTE ONLY): 39 min   Charges:   PT Evaluation $Initial PT Evaluation Tier I: 1 Procedure PT Treatments $Gait Training: 8-22 mins $Therapeutic Exercise: 8-22 mins   PT G Codes:        Jakiera Ehler 07/17/2014, 5:11 PM

## 2014-07-17 NOTE — Op Note (Signed)
OPERATIVE REPORT  PREOPERATIVE DIAGNOSIS: Osteoarthritis of the Left hip.   POSTOPERATIVE DIAGNOSIS: Osteoarthritis of the Left  hip.   PROCEDURE: Left total hip arthroplasty, anterior approach.   SURGEON: Ollen GrossFrank Gladys Gutman, MD   ASSISTANT: Avel Peacerew Perkins, PA-C  ANESTHESIA:  Spinal  ESTIMATED BLOOD LOSS:-600 ml   DRAINS: Hemovac x1.   COMPLICATIONS: None   CONDITION: PACU - hemodynamically stable.   BRIEF CLINICAL NOTE: Connie Lindsey is a 69 y.o. female who has advanced end-  stage arthritis of her Left  hip with progressively worsening pain and  dysfunction.The patient has failed nonoperative management and presents for  total hip arthroplasty.   PROCEDURE IN DETAIL: After successful administration of spinal  anesthetic, the traction boots [facility] were placed on both  feet and the patient was [facility] the [facility] bed, boots placed into the leg  holders. The Left hip was then isolated from the perineum with plastic  drapes and prepped and draped in the usual sterile fashion. ASIS and  greater trochanter were marked and a oblique incision was made, starting  at about 1 cm lateral and 2 cm distal to the ASIS and coursing towards  the anterior cortex of the femur. The skin was cut with a 10 blade  through subcutaneous tissue to the level of the fascia overlying the  tensor fascia lata muscle. The fascia was then incised in line with the  incision at the junction of the anterior third and posterior 2/3rd. The  muscle was teased off the fascia and then the interval between the TFL  and the rectus was developed. The Hohmann retractor was then placed at  the top of the femoral neck over the capsule. The vessels overlying the  capsule were cauterized and the fat on top of the capsule was removed.  A Hohmann retractor was then placed anterior underneath the rectus  femoris to give exposure to the entire anterior capsule. A T-shaped  capsulotomy was performed. The  edges were tagged and the femoral head  was identified.       Osteophytes are removed off the superior acetabulum.  The femoral neck was then cut in situ with an oscillating saw. Traction  was then applied to the left lower extremity utilizing the [facility]  traction. The femoral head was then removed. Retractors were placed  around the acetabulum and then circumferential removal of the labrum was  performed. Osteophytes were also removed. Reaming starts at 45 mm to  medialize and  Increased in 2 mm increments to 47 mm. We reamed in  approximately 40 degrees of abduction, 20 degrees anteversion. A 48 mm  pinnacle acetabular shell was then impacted in anatomic position under  fluoroscopic guidance with excellent purchase. We did not need to place  any additional dome screws. A 28 mm neutral = 4 marathon liner was then  placed into the acetabular shell.       The femoral lift was then placed along the lateral aspect of the femur  just distal to the vastus ridge. The leg was  externally rotated and capsule  was stripped off the inferior aspect of the femoral neck down to the  level of the lesser trochanter, this was done with electrocautery. The femur was lifted after this was performed. The  leg was then placed and extended in adducted position to essentially delivering the femur. We also removed the capsule superiorly and the  piriformis from the piriformis  fossa to gain excellent exposure of the  proximal femur. Rongeur was used to remove some cancellous bone to get  into the lateral portion of the proximal femur for placement of the  initial starter reamer. The starter broaches was placed  the starter broach  and was shown to go down the center of the canal. Broaching  with the  Corail system was then performed starting at size 8, coursing  Up to size 11. A size 11 had excellent torsional and rotational  and axial stability. The trial standard offset neck was then placed  with a 28 + 1.5 trial  head. The hip was then reduced. We confirmed that  the stem was in the canal both on AP and lateral x-rays. It also has excellent sizing. The hip was reduced with outstanding stability through full extension, full external rotation,  and then flexion in adduction internal rotation. AP pelvis was taken  and the leg lengths were measured and found to be exactly equal. Hip  was then dislocated again and the femoral head and neck removed. The  femoral broach was removed. Size 11 Corail stem with a standard offset  neck was then impacted into the femur following native anteversion. Has  excellent purchase in the canal. Excellent torsional and rotational and  axial stability. It is confirmed to be in the canal on AP and lateral  fluoroscopic views. The 28 + 1.5 ceramic head was placed and the hip  reduced with outstanding stability. Again AP pelvis was taken and it  confirmed that the leg lengths were equal. The wound was then copiously  irrigated with saline solution and the capsule reattached and repaired  with Ethibond suture.  20 mL of Exparel mixed with 50 mL of saline then additional 20 ml of .25% Bupivicaine injected into the capsule and into the edge of the tensor fascia lata as well as subcutaneous tissue. The fascia overlying the tensor fascia lata was  then closed with a running #1 V-Loc. Subcu was closed with interrupted  2-0 Vicryl and subcuticular running 4-0 Monocryl. Incision was cleaned  and dried. Steri-Strips and a bulky sterile dressing applied. Hemovac  drain was hooked to suction and then he was awakened and transported to  recovery in stable condition.        Please note that a surgical assistant was a medical necessity for this procedure to perform it in a safe and expeditious manner. Assistant was necessary to provide appropriate retraction of vital neurovascular structures and to prevent femoral fracture and allow for anatomic placement of the prosthesis.  Gaynelle Arabian, M.D.

## 2014-07-17 NOTE — Anesthesia Procedure Notes (Signed)
Spinal Patient location during procedure: OR Staffing Anesthesiologist: Asiah Befort Performed by: anesthesiologist  Preanesthetic Checklist Completed: patient identified, site marked, surgical consent, pre-op evaluation, timeout performed, IV checked, risks and benefits discussed and monitors and equipment checked Spinal Block Patient position: sitting Prep: Betadine Patient monitoring: heart rate, continuous pulse ox and blood pressure Approach: right paramedian Location: L3-4 Injection technique: single-shot Needle Needle type: Spinocan  Needle gauge: 22 G Needle length: 9 cm Additional Notes Expiration date of kit checked and confirmed. Patient tolerated procedure well, without complications.     

## 2014-07-18 ENCOUNTER — Encounter (HOSPITAL_COMMUNITY): Payer: Self-pay | Admitting: Orthopedic Surgery

## 2014-07-18 LAB — BASIC METABOLIC PANEL
ANION GAP: 9 (ref 5–15)
BUN: 11 mg/dL (ref 6–23)
CALCIUM: 8.7 mg/dL (ref 8.4–10.5)
CHLORIDE: 100 mmol/L (ref 96–112)
CO2: 26 mmol/L (ref 19–32)
Creatinine, Ser: 0.63 mg/dL (ref 0.50–1.10)
GFR calc non Af Amer: 89 mL/min — ABNORMAL LOW (ref >90)
Glucose, Bld: 177 mg/dL — ABNORMAL HIGH (ref 70–99)
Potassium: 2.8 mmol/L — ABNORMAL LOW (ref 3.5–5.1)
Sodium: 135 mmol/L (ref 135–145)

## 2014-07-18 LAB — CBC
HCT: 31.1 % — ABNORMAL LOW (ref 36.0–46.0)
Hemoglobin: 11 g/dL — ABNORMAL LOW (ref 12.0–15.0)
MCH: 32.1 pg (ref 26.0–34.0)
MCHC: 35.4 g/dL (ref 30.0–36.0)
MCV: 90.7 fL (ref 78.0–100.0)
PLATELETS: 247 10*3/uL (ref 150–400)
RBC: 3.43 MIL/uL — ABNORMAL LOW (ref 3.87–5.11)
RDW: 13.8 % (ref 11.5–15.5)
WBC: 19.1 10*3/uL — ABNORMAL HIGH (ref 4.0–10.5)

## 2014-07-18 MED ORDER — RIVAROXABAN 10 MG PO TABS: 10.0000 mg | ORAL_TABLET | Freq: Every day | ORAL | Status: DC

## 2014-07-18 MED ORDER — TRAMADOL HCL 50 MG PO TABS: 50.0000 mg | ORAL_TABLET | Freq: Four times a day (QID) | ORAL | Status: DC | PRN

## 2014-07-18 MED ORDER — METHOCARBAMOL 500 MG PO TABS: 500.0000 mg | ORAL_TABLET | Freq: Four times a day (QID) | ORAL | Status: DC | PRN

## 2014-07-18 MED ORDER — OXYCODONE HCL 5 MG PO TABS: 5.0000 mg | ORAL_TABLET | ORAL | Status: DC | PRN

## 2014-07-18 NOTE — Progress Notes (Signed)
Physical Therapy Treatment Patient Details Name: Connie Lindsey MRN: [id number] DOB: 11-30-1945 Today's Date: 07/18/2014    History of Present Illness L THR    PT Comments    POD # 1 pm session pt feeling better and amb an increased distance.  Returned to room then performed THR TE's followed by ICE.  Pt on target to D/C to home tomorrow.  Follow Up Recommendations  Home health PT     Equipment Recommendations  None recommended by PT    Recommendations for Other Services       Precautions / Restrictions Precautions Precautions: Fall Restrictions Weight Bearing Restrictions: No Other Position/Activity Restrictions: WBAT    Mobility  Bed Mobility Overal bed mobility: Needs Assistance Bed Mobility: Supine to Sit     Supine to sit: Min assist;Mod assist     General bed mobility comments: Pt OOB in recliner  Transfers Overall transfer level: Needs assistance Equipment used: Rolling walker (2 wheeled) Transfers: Sit to/from Stand Sit to Stand: Min guard;Supervision         General transfer comment: cues for UE placement and increased time  Ambulation/Gait Ambulation/Gait assistance: Min guard;Min assist Ambulation Distance (Feet): 125 Feet Assistive device: Rolling walker (2 wheeled) Gait Pattern/deviations: Step-to pattern;Shuffle Gait velocity: decreased   General Gait Details: pt feeling "better" and tolerated an increased amb distance.  BP 145/62 after amb 15 feet.     Stairs Stairs: Yes Stairs assistance: Min assist Stair Management: No rails;Step to pattern;Backwards;With walker Number of Stairs: 22 General stair comments: 75% VC's on proper sequencing and safety.  Pt required increased time to process directions.  Wheelchair Mobility    Modified Rankin (Stroke Patients Only)       Balance                                    Cognition Arousal/Alertness: Awake/alert Behavior During Therapy: WFL for tasks  assessed/performed Overall Cognitive Status: Within Functional Limits for tasks assessed                      Exercises   Total Hip Replacement TE's 10 reps ankle pumps 10 reps knee presses 10 reps heel slides 10 reps SAQ's 10 reps ABD Followed by ICE     General Comments        Pertinent Vitals/Pain Pain Assessment: 0-10 Pain Score: 4  Pain Location: L hip Pain Descriptors / Indicators: Aching Pain Intervention(s): Monitored during session;Premedicated before session;Repositioned;Ice applied    Home Living Family/patient expects to be discharged to:: Private residence Living Arrangements: Spouse/significant other           Home Equipment: Bedside commode      Prior Function Level of Independence: Independent          PT Goals (current goals can now be found in the care plan section) Progress towards PT goals: Progressing toward goals    Frequency  7X/week    PT Plan      Co-evaluation             End of Session Equipment Utilized During Treatment: Gait belt Activity Tolerance: Treatment limited secondary to medical complications (Comment) (nausea with active vomiting)       Time: 1310-1340 PT Time Calculation (min) (ACUTE ONLY): 30 min  Charges:  $Gait Training: 8-22 mins $Therapeutic Exercise: 8-22 mins  G Codes:      Connie Lindsey  PTA [facility]  Acute  Rehab Pager      [phone]

## 2014-07-18 NOTE — Discharge Instructions (Addendum)
°Dr. Frank Aluisio °Total Joint Specialist °[facility] °[address] °[place], [place] [address] °[phone] ° ° ° °ANTERIOR APPROACH TOTAL HIP REPLACEMENT POSTOPERATIVE DIRECTIONS ° ° °Hip Rehabilitation, Guidelines Following Surgery  °The results of a hip operation are greatly improved after range of motion and muscle strengthening exercises. Follow all safety measures which are given to protect your hip. If any of these exercises cause increased pain or swelling in your joint, decrease the amount until you are comfortable again. Then slowly increase the exercises. Call your caregiver if you have problems or questions.  °HOME CARE INSTRUCTIONS  °Most of the following instructions are designed to prevent the dislocation of your new hip.  °Remove items at home which could result in a fall. This includes throw rugs or furniture in walking pathways.  °Continue medications as instructed at time of discharge. °· You may have some home medications which will be placed on hold until you complete the course of blood thinner medication. °· You may start showering once you are discharged home but do not submerge the incision under water. Just pat the incision dry and apply a dry gauze dressing on daily. °Do not put on socks or shoes without following the instructions of your caregivers.  °Sit on high chairs which makes it easier to stand.  °Sit on chairs with arms. Use the chair arms to help push yourself up when arising.  °Keep your leg on the side of the operation out in front of you when standing up.  °Arrange for the use of a toilet seat elevator so you are not sitting low.   °· Walk with walker as instructed.  °You may resume a sexual relationship in one month or when given the OK by your caregiver.  °Use walker as long as suggested by your caregivers.  °You may put full weight on your legs and walk as much as is comfortable. °Avoid periods of inactivity such as sitting longer than an hour  when not asleep. This helps prevent blood clots.  °You may return to work once you are cleared by your surgeon.  °Do not drive a car for 6 weeks or until released by your surgeon.  °Do not drive while taking narcotics.  °Wear elastic stockings for three weeks following surgery during the day but you may remove then at night.  °Make sure you keep all of your appointments after your operation with all of your doctors and caregivers. You should call the office at the above phone number and make an appointment for approximately two weeks after the date of your surgery. °Change the dressing daily and reapply a dry dressing each time. °Please pick up a stool softener and laxative for home use as long as you are requiring pain medications. °· ICE to the affected hip every three hours for 30 minutes at a time and then as needed for pain and swelling.  Continue to use ice on the hip for pain and swelling from surgery. You may notice swelling that will progress down to the foot and ankle.  This is normal after surgery.  Elevate the leg when you are not up walking on it.   °It is important for you to complete the blood thinner medication as prescribed by your doctor. °· Continue to use the breathing machine which will help keep your temperature down.  It is common for your temperature to cycle up and down following surgery, especially at night when you are not up moving around   and exerting yourself.  The breathing machine keeps your lungs expanded and your temperature down. ° °RANGE OF MOTION AND STRENGTHENING EXERCISES  °These exercises are designed to help you keep full movement of your hip joint. Follow your caregiver's or physical therapist's instructions. Perform all exercises about fifteen times, three times per day or as directed. Exercise both hips, even if you have had only one joint replacement. These exercises can be done on a training (exercise) mat, on the floor, on a table or on a bed. Use whatever works the best  and is most comfortable for you. Use music or television while you are exercising so that the exercises are a pleasant break in your day. This will make your life better with the exercises acting as a break in routine you can look forward to.  °Lying on your back, slowly slide your foot toward your buttocks, raising your knee up off the floor. Then slowly slide your foot back down until your leg is straight again.  °Lying on your back spread your legs as far apart as you can without causing discomfort.  °Lying on your side, raise your upper leg and foot straight up from the floor as far as is comfortable. Slowly lower the leg and repeat.  °Lying on your back, tighten up the muscle in the front of your thigh (quadriceps muscles). You can do this by keeping your leg straight and trying to raise your heel off the floor. This helps strengthen the largest muscle supporting your knee.  °Lying on your back, tighten up the muscles of your buttocks both with the legs straight and with the knee bent at a comfortable angle while keeping your heel on the floor.  ° °SKILLED REHAB INSTRUCTIONS: °If the patient is transferred to a skilled rehab facility following release from the hospital, a list of the current medications will be sent to the facility for the patient to continue.  When discharged from the skilled rehab facility, please have the facility set up the patient's Home Health Physical Therapy prior to being released. Also, the skilled facility will be responsible for providing the patient with their medications at time of release from the facility to include their pain medication, the muscle relaxants, and their blood thinner medication. If the patient is still at the rehab facility at time of the two week follow up appointment, the skilled rehab facility will also need to assist the patient in arranging follow up appointment in our office and any transportation needs. ° °MAKE SURE YOU:  °Understand these instructions.    °Will watch your condition.  °Will get help right away if you are not doing well or get worse. ° °Pick up stool softner and laxative for home use following surgery while on pain medications. °Do not submerge incision under water. °Please use good hand washing techniques while changing dressing each day. °May shower starting three days after surgery. °Please use a clean towel to pat the incision dry following showers. °Continue to use ice for pain and swelling after surgery. °Do not use any lotions or creams on the incision until instructed by your surgeon. °Total Hip Protocol. ° °Take Xarelto for two and a half more weeks, then discontinue Xarelto. °Once the patient has completed the blood thinner regimen, then take a Baby 81 mg Aspirin daily for three more weeks. ° °Postoperative Constipation Protocol ° °Constipation - defined medically as fewer than three stools per week and severe constipation as less than one stool per week. ° °  One of the most common issues patients have following surgery is constipation.  Even if you have a regular bowel pattern at home, your normal regimen is likely to be disrupted due to multiple reasons following surgery.  Combination of anesthesia, postoperative narcotics, change in appetite and fluid intake all can affect your bowels.  In order to avoid complications following surgery, here are some recommendations in order to help you during your recovery period. ° °Colace (docusate) - Pick up an over-the-counter form of Colace or another stool softener and take twice a day as long as you are requiring postoperative pain medications.  Take with a full glass of water daily.  If you experience loose stools or diarrhea, hold the colace until you stool forms back up.  If your symptoms do not get better within 1 week or if they get worse, check with your doctor. ° °Dulcolax (bisacodyl) - Pick up over-the-counter and take as directed by the product packaging as needed to assist with the  movement of your bowels.  Take with a full glass of water.  Use this product as needed if not relieved by Colace only.  ° °MiraLax (polyethylene glycol) - Pick up over-the-counter to have on hand.  MiraLax is a solution that will increase the amount of water in your bowels to assist with bowel movements.  Take as directed and can mix with a glass of water, juice, soda, coffee, or tea.  Take if you go more than two days without a movement. °Do not use MiraLax more than once per day. Call your doctor if you are still constipated or irregular after using this medication for 7 days in a row. ° °If you continue to have problems with postoperative constipation, please contact the office for further assistance and recommendations.  If you experience "the worst abdominal pain ever" or develop nausea or vomiting, please contact the office immediatly for further recommendations for treatment. °======================================================================================================= ° °Information on my medicine - XARELTO® (Rivaroxaban) ° °This medication education was reviewed with me or my healthcare representative as part of my discharge preparation.  The pharmacist that spoke with me during my hospital stay was:  Absher, Randall K, RPH ° °Why was Xarelto® prescribed for you? °Xarelto® was prescribed for you to reduce the risk of blood clots forming after orthopedic surgery. The medical term for these abnormal blood clots is venous thromboembolism (VTE). ° °What do you need to know about xarelto® ? °Take your Xarelto® ONCE DAILY at the same time every day. °You may take it either with or without food. ° °If you have difficulty swallowing the tablet whole, you may crush it and mix in applesauce just prior to taking your dose. ° °Take Xarelto® exactly as prescribed by your doctor and DO NOT stop taking Xarelto® without talking to the doctor who prescribed the medication.  Stopping without other VTE prevention  medication to take the place of Xarelto® may increase your risk of developing a clot. ° °After discharge, you should have regular check-up appointments with your healthcare provider that is prescribing your Xarelto®.   ° °What do you do if you miss a dose? °If you miss a dose, take it as soon as you remember on the same day then continue your regularly scheduled once daily regimen the next day. Do not take two doses of Xarelto® on the same day.  ° °Important Safety Information °A possible side effect of Xarelto® is bleeding. You should call your healthcare provider right away if you experience   any of the following: °? Bleeding from an injury or your nose that does not stop. °? Unusual colored urine (red or dark brown) or unusual colored stools (red or black). °? Unusual bruising for unknown reasons. °? A serious fall or if you hit your head (even if there is no bleeding). ° °Some medicines may interact with Xarelto® and might increase your risk of bleeding while on Xarelto®. To help avoid this, consult your healthcare provider or pharmacist prior to using any new prescription or non-prescription medications, including herbals, vitamins, non-steroidal anti-inflammatory drugs (NSAIDs) and supplements. ° °This website has more information on Xarelto®: www.xarelto.com. ° ° °

## 2014-07-18 NOTE — Evaluation (Signed)
Occupational Therapy Evaluation Patient Details Name: Connie Lindsey MRN: [id number] DOB: 17-Apr-1946 Today's Date: 07/18/2014    History of Present Illness L THR   Clinical Impression   This 69 year old female was admitted for the above surgery. All education was completed.  No further OT is needed at this time.    Follow Up Recommendations  No OT follow up    Equipment Recommendations  None recommended by OT    Recommendations for Other Services       Precautions / Restrictions Precautions Precautions: Fall Restrictions Weight Bearing Restrictions: No Other Position/Activity Restrictions: WBAT      Mobility Bed Mobility                  Transfers   Equipment used: Rolling walker (2 wheeled) Transfers: Sit to/from Stand Sit to Stand: Supervision         General transfer comment: cues for UE placement    Balance                                            ADL Overall ADL's : Needs assistance/impaired     Grooming: Oral care;Standing;Supervision/safety       Lower Body Bathing: Minimal assistance;Sit to/from stand       Lower Body Dressing: Minimal assistance;Sit to/from stand   Toilet Transfer: Min guard;Ambulation;BSC             General ADL Comments: pt is able to perform UB adls with set up. She need min A for bottom of foot, donning sock and shoe.  Husband can assist her. She has a tub:  discussed tub readiness--sponge bathing initially vs. bench.  She plans to sponge bathe initially.       Vision     Perception     Praxis      Pertinent Vitals/Pain Pain Score: 4  Pain Location: L hip Pain Descriptors / Indicators: Aching Pain Intervention(s): Limited activity within patient's tolerance;Monitored during session;Premedicated before session;Repositioned;Ice applied     Hand Dominance Right   Extremity/Trunk Assessment Upper Extremity Assessment Upper Extremity Assessment: Overall WFL for tasks  assessed           Communication Communication Communication: No difficulties   Cognition Arousal/Alertness: Awake/alert Behavior During Therapy: WFL for tasks assessed/performed Overall Cognitive Status: Within Functional Limits for tasks assessed                     General Comments       Exercises       Shoulder Instructions      Home Living Family/patient expects to be discharged to:: Private residence Living Arrangements: Spouse/significant other                 Bathroom Shower/Tub: Tub/shower unit Shower/tub characteristics: Engineer, building servicesCurtain Bathroom Toilet: Handicapped height     Home Equipment: Bedside commode          Prior Functioning/Environment Level of Independence: Independent             OT Diagnosis: Generalized weakness   OT Problem List:     OT Treatment/Interventions:      OT Goals(Current goals can be found in the care plan section)    OT Frequency:     Barriers to D/C:            Co-evaluation  End of Session    Activity Tolerance: Patient tolerated treatment well Patient left: in chair;with call bell/phone within reach   Time: 0952-1013 OT Time Calculation (min): 21 min Charges:  OT General Charges $OT Visit: 1 Procedure OT Evaluation $Initial OT Evaluation Tier I: 1 Procedure G-Codes:    Connie Lindsey 04-Aug-2014, 11:04 AM   Marica Otter, OTR/L [phone] 08/04/2014

## 2014-07-18 NOTE — Progress Notes (Signed)
Physical Therapy Treatment Patient Details Name: Connie Lindsey MRN: [id number] DOB: 1945/09/11 Today's Date: 07/18/2014    History of Present Illness L THR    PT Comments    POD # 1 am session.  Assisted pt OOB to amb to BR then in hallway feeling "funny" then max c/o nausea with active vomiting.  RN notified.  BP's taken and in vitals section.  Returned to room in recliner.  Follow Up Recommendations  Home health PT     Equipment Recommendations  None recommended by PT    Recommendations for Other Services       Precautions / Restrictions Precautions Precautions: Fall Restrictions Weight Bearing Restrictions: No Other Position/Activity Restrictions: WBAT    Mobility  Bed Mobility Overal bed mobility: Needs Assistance Bed Mobility: Supine to Sit     Supine to sit: Min assist;Mod assist     General bed mobility comments: cues for sequence and use of UEs to self assist  Transfers Overall transfer level: Needs assistance Equipment used: Rolling walker (2 wheeled) Transfers: Sit to/from Stand Sit to Stand: Min guard;Supervision         General transfer comment: cues for UE placement and increased time  Ambulation/Gait Ambulation/Gait assistance: Min guard;Min assist Ambulation Distance (Feet): 75 Feet Assistive device: Rolling walker (2 wheeled) Gait Pattern/deviations: Step-to pattern;Shuffle Gait velocity: decreased   General Gait Details: pt feeling "funny" and weak.  Amb to BR then in hallway with + 2 asssit for safety.  Increased c/o nausea with increased activity.     Stairs Stairs: Yes Stairs assistance: Min assist Stair Management: No rails;Step to pattern;Backwards;With walker Number of Stairs: 22 General stair comments: 75% VC's on proper sequencing and safety.  Pt required increased time to process directions.  Wheelchair Mobility    Modified Rankin (Stroke Patients Only)       Balance                                     Cognition Arousal/Alertness: Awake/alert Behavior During Therapy: WFL for tasks assessed/performed Overall Cognitive Status: Within Functional Limits for tasks assessed                      Exercises      General Comments        Pertinent Vitals/Pain Pain Assessment: 0-10 Pain Score: 4  Pain Location: L hip Pain Descriptors / Indicators: Aching Pain Intervention(s): Monitored during session;Premedicated before session;Repositioned;Ice applied    Home Living Family/patient expects to be discharged to:: Private residence Living Arrangements: Spouse/significant other           Home Equipment: Bedside commode      Prior Function Level of Independence: Independent          PT Goals (current goals can now be found in the care plan section) Progress towards PT goals: Progressing toward goals    Frequency  7X/week    PT Plan      Co-evaluation             End of Session Equipment Utilized During Treatment: Gait belt Activity Tolerance: Treatment limited secondary to medical complications (Comment) (nausea with active vomiting)       Time: [id number] PT Time Calculation (min) (ACUTE ONLY): 25 min  Charges:  $Gait Training: 8-22 mins $Therapeutic Activity: 8-22 mins  G Codes:      Rica Koyanagi  PTA [facility]  Acute  Rehab Pager      [phone]

## 2014-07-18 NOTE — Discharge Summary (Signed)
Physician Discharge Summary   Patient ID: Connie Lindsey MRN: [id number] DOB/AGE: [birth year] 69 y.o.  Admit date: 07/17/2014 Discharge date: 07/19/2014  Primary Diagnosis:  Osteoarthritis of the Left hip. Admission Diagnoses:  Past Medical History  Diagnosis Date  . Family history of adverse reaction to anesthesia     father- had fall- hip surgery - memory loss then had pneumonia   . Hypertension   . GERD (gastroesophageal reflux disease)   . Arthritis    Discharge Diagnoses:   Principal Problem:   OA (osteoarthritis) of hip  Estimated body mass index is 24.19 kg/(m^2) as calculated from the following:   Height as of this encounter: _0  (1.626 m).   Weight as of this encounter: 63.957 kg (141 lb).  Procedure(s) (LRB): LEFT TOTAL HIP ARTHROPLASTY ANTERIOR APPROACH (Left)   Consults: None  HPI: Connie Lindsey is a 69 y.o. female who has advanced end-  stage arthritis of her Left hip with progressively worsening pain and  dysfunction.The patient has failed nonoperative management and presents for  total hip arthroplasty.   Laboratory Data: Admission on 07/17/2014, Discharged on 07/19/2014  Component Date Value Ref Range Status  . ABO/RH(D) 07/17/2014 O POS   Final  . Antibody Screen 07/17/2014 NEG   Final  . Sample Expiration 07/17/2014 07/20/2014   Final  . ABO/RH(D) 07/17/2014 O POS   Final  . WBC 07/18/2014 19.1* 4.0 - 10.5 K/uL Final  . RBC 07/18/2014 3.43* 3.87 - 5.11 MIL/uL Final  . Hemoglobin 07/18/2014 11.0* 12.0 - 15.0 g/dL Final  . HCT 07/18/2014 31.1* 36.0 - 46.0 % Final  . MCV 07/18/2014 90.7  78.0 - 100.0 fL Final  . MCH 07/18/2014 32.1  26.0 - 34.0 pg Final  . MCHC 07/18/2014 35.4  30.0 - 36.0 g/dL Final  . RDW 07/18/2014 13.8  11.5 - 15.5 % Final  . Platelets 07/18/2014 247  150 - 400 K/uL Final  . Sodium 07/18/2014 135  135 - 145 mmol/L Final  . Potassium 07/18/2014 2.8* 3.5 - 5.1 mmol/L Final  . Chloride 07/18/2014 100  96 - 112 mmol/L Final    . CO2 07/18/2014 26  19 - 32 mmol/L Final  . Glucose, Bld 07/18/2014 177* 70 - 99 mg/dL Final  . BUN 07/18/2014 11  6 - 23 mg/dL Final  . Creatinine, Ser 07/18/2014 0.63  0.50 - 1.10 mg/dL Final  . Calcium 07/18/2014 8.7  8.4 - 10.5 mg/dL Final  . GFR calc non Af Amer 07/18/2014 89* >90 mL/min Final  . GFR calc Af Amer 07/18/2014 >90  >90 mL/min Final   Comment: (NOTE) The eGFR has been calculated using the CKD EPI equation. This calculation has not been validated in all clinical situations. eGFR's persistently <90 mL/min signify possible Chronic Kidney Disease.   . Anion gap 07/18/2014 9  5 - 15 Final  . WBC 07/19/2014 20.5* 4.0 - 10.5 K/uL Final  . RBC 07/19/2014 3.44* 3.87 - 5.11 MIL/uL Final  . Hemoglobin 07/19/2014 10.9* 12.0 - 15.0 g/dL Final  . HCT 07/19/2014 31.3* 36.0 - 46.0 % Final  . MCV 07/19/2014 91.0  78.0 - 100.0 fL Final  . MCH 07/19/2014 31.7  26.0 - 34.0 pg Final  . MCHC 07/19/2014 34.8  30.0 - 36.0 g/dL Final  . RDW 07/19/2014 14.2  11.5 - 15.5 % Final  . Platelets 07/19/2014 229  150 - 400 K/uL Final  . Sodium 07/19/2014 133* 135 - 145 mmol/L Final  . Potassium 07/19/2014  3.4* 3.5 - 5.1 mmol/L Final   Comment: DELTA CHECK NOTED REPEATED TO VERIFY NO VISIBLE HEMOLYSIS   . Chloride 07/19/2014 100  96 - 112 mmol/L Final  . CO2 07/19/2014 27  19 - 32 mmol/L Final  . Glucose, Bld 07/19/2014 127* 70 - 99 mg/dL Final  . BUN 07/19/2014 11  6 - 23 mg/dL Final  . Creatinine, Ser 07/19/2014 0.59  0.50 - 1.10 mg/dL Final  . Calcium 07/19/2014 8.4  8.4 - 10.5 mg/dL Final  . GFR calc non Af Amer 07/19/2014 >90  >90 mL/min Final  . GFR calc Af Amer 07/19/2014 >90  >90 mL/min Final   Comment: (NOTE) The eGFR has been calculated using the CKD EPI equation. This calculation has not been validated in all clinical situations. eGFR's persistently <90 mL/min signify possible Chronic Kidney Disease.   Georgiann Hahn gap 07/19/2014 6  5 - 15 Final  Hospital Outpatient Visit on  07/10/2014  Component Date Value Ref Range Status  . aPTT 07/10/2014 34  24 - 37 seconds Final  . WBC 07/10/2014 7.4  4.0 - 10.5 K/uL Final  . RBC 07/10/2014 4.58  3.87 - 5.11 MIL/uL Final  . Hemoglobin 07/10/2014 14.7  12.0 - 15.0 g/dL Final  . HCT 07/10/2014 42.0  36.0 - 46.0 % Final  . MCV 07/10/2014 91.7  78.0 - 100.0 fL Final  . MCH 07/10/2014 32.1  26.0 - 34.0 pg Final  . MCHC 07/10/2014 35.0  30.0 - 36.0 g/dL Final  . RDW 07/10/2014 13.9  11.5 - 15.5 % Final  . Platelets 07/10/2014 289  150 - 400 K/uL Final  . Sodium 07/10/2014 139  135 - 145 mmol/L Final  . Potassium 07/10/2014 4.4  3.5 - 5.1 mmol/L Final  . Chloride 07/10/2014 101  96 - 112 mmol/L Final  . CO2 07/10/2014 30  19 - 32 mmol/L Final  . Glucose, Bld 07/10/2014 98  70 - 99 mg/dL Final  . BUN 07/10/2014 15  6 - 23 mg/dL Final  . Creatinine, Ser 07/10/2014 0.69  0.50 - 1.10 mg/dL Final  . Calcium 07/10/2014 9.5  8.4 - 10.5 mg/dL Final  . Total Protein 07/10/2014 8.1  6.0 - 8.3 g/dL Final  . Albumin 07/10/2014 4.5  3.5 - 5.2 g/dL Final  . AST 07/10/2014 27  0 - 37 U/L Final  . ALT 07/10/2014 26  0 - 35 U/L Final  . Alkaline Phosphatase 07/10/2014 83  39 - 117 U/L Final  . Total Bilirubin 07/10/2014 0.7  0.3 - 1.2 mg/dL Final  . GFR calc non Af Amer 07/10/2014 86* >90 mL/min Final  . GFR calc Af Amer 07/10/2014 >90  >90 mL/min Final   Comment: (NOTE) The eGFR has been calculated using the CKD EPI equation. This calculation has not been validated in all clinical situations. eGFR's persistently <90 mL/min signify possible Chronic Kidney Disease.   . Anion gap 07/10/2014 8  5 - 15 Final  . Prothrombin Time 07/10/2014 13.3  11.6 - 15.2 seconds Final  . INR 07/10/2014 1.00  0.00 - 1.49 Final  . Color, Urine 07/10/2014 YELLOW  YELLOW Final  . APPearance 07/10/2014 CLEAR  CLEAR Final  . Specific Gravity, Urine 07/10/2014 1.008  1.005 - 1.030 Final  . pH 07/10/2014 7.5  5.0 - 8.0 Final  . Glucose, UA 07/10/2014  NEGATIVE  NEGATIVE mg/dL Final  . Hgb urine dipstick 07/10/2014 NEGATIVE  NEGATIVE Final  . Bilirubin Urine 07/10/2014 NEGATIVE  NEGATIVE Final  .  Ketones, ur 07/10/2014 NEGATIVE  NEGATIVE mg/dL Final  . Protein, ur 07/10/2014 NEGATIVE  NEGATIVE mg/dL Final  . Urobilinogen, UA 07/10/2014 0.2  0.0 - 1.0 mg/dL Final  . Nitrite 07/10/2014 NEGATIVE  NEGATIVE Final  . Leukocytes, UA 07/10/2014 NEGATIVE  NEGATIVE Final   MICROSCOPIC NOT DONE ON URINES WITH NEGATIVE PROTEIN, BLOOD, LEUKOCYTES, NITRITE, OR GLUCOSE <1000 mg/dL.  Marland Kitchen MRSA, PCR 07/10/2014 NEGATIVE  NEGATIVE Final  . Staphylococcus aureus 07/10/2014 NEGATIVE  NEGATIVE Final   Comment:        The Xpert SA Assay (FDA approved for NASAL specimens in patients over 57 years of age), is one component of a comprehensive surveillance program.  Test performance has been validated by [facility] for patients greater than or equal to 59 year old. It is not intended to diagnose infection nor to guide or monitor treatment.   Orders Only on 06/19/2014  Component Date Value Ref Range Status  . CYTOLOGY - PAP 06/19/2014 PAP RESULT   Final     X-Rays:Dg Pelvis Portable  07/17/2014   CLINICAL DATA:  69 year old female status post left total hip arthroplasty  EXAM: DG C-ARM 1-60 MIN - NRPT [facility]; PORTABLE PELVIS 1-2 VIEWS  COMPARISON:  None.  FINDINGS: Surgical changes consistent with left total hip arthroplasty. No evidence of immediate hardware complication. A single surgical drain is noted lateral to the greater trochanter. Expected postoperative changes including subcutaneous emphysema within the soft tissues. The visualized bony pelvis is intact. Perhaps mild degenerative osteoarthritis of the right hip joint.  IMPRESSION: Left total hip arthroplasty without evidence of acute complication.   Electronically Signed   By: Jacqulynn Cadet M.D.   On: 07/17/2014 09:57   Dg C-arm 1-60 Min-no Report  07/17/2014   CLINICAL DATA:  69 year old female  status post left total hip arthroplasty  EXAM: DG C-ARM 1-60 MIN - NRPT [facility]; PORTABLE PELVIS 1-2 VIEWS  COMPARISON:  None.  FINDINGS: Surgical changes consistent with left total hip arthroplasty. No evidence of immediate hardware complication. A single surgical drain is noted lateral to the greater trochanter. Expected postoperative changes including subcutaneous emphysema within the soft tissues. The visualized bony pelvis is intact. Perhaps mild degenerative osteoarthritis of the right hip joint.  IMPRESSION: Left total hip arthroplasty without evidence of acute complication.   Electronically Signed   By: Jacqulynn Cadet M.D.   On: 07/17/2014 09:57    EKG: Orders placed or performed in visit on 07/10/14  . EKG 12-Lead     Hospital Course: Patient was admitted to [facility] and taken to the OR and underwent the above state procedure without complications.  Patient tolerated the procedure well and was later transferred to the recovery room and then to the orthopaedic floor for postoperative care.  They were given PO and IV analgesics for pain control following their surgery.  They were given 24 hours of postoperative antibiotics of  Anti-infectives    Start     Dose/Rate Route Frequency Ordered Stop   07/17/14 1800  valACYclovir (VALTREX) tablet 500 mg  Status:  Discontinued     500 mg Oral Daily 07/17/14 1128 07/19/14 1617   07/17/14 1300  ceFAZolin (ANCEF) IVPB 1 g/50 mL premix     1 g 100 mL/hr over 30 Minutes Intravenous Every 6 hours 07/17/14 1128 07/17/14 1959   07/17/14 0529  ceFAZolin (ANCEF) IVPB 2 g/50 mL premix     2 g 100 mL/hr over 30 Minutes Intravenous On call to O.R. 07/17/14 5784  07/17/14 0715     and started on DVT prophylaxis in the form of Xarelto.   PT and OT were ordered for total hip protocol.  The patient was allowed to be WBAT with therapy. Discharge planning was consulted to help with postop disposition and equipment needs.  Patient had a very good night on  the evening of surgery and walked 90 feet the day of surgery.  They started to get up OOB with therapy on day one.  Hemovac drain was pulled without difficulty.  Continued to work with therapy into day two.  Dressing was changed on day two and the incision was healing well.  Patient was seen in rounds and was ready to go home.  Discharge home with home health when met all goals Diet - Cardiac diet Follow up - in 2 weeks Activity - WBAT Disposition - Home Condition Upon Discharge - good D/C Meds - See DC Summary DVT Prophylaxis - Xarelto      Discharge Instructions    Call MD / Call 911    Complete by:  As directed   If you experience chest pain or shortness of breath, CALL 911 and be transported to the hospital emergency room.  If you develope a fever above 101 F, pus (white drainage) or increased drainage or redness at the wound, or calf pain, call your surgeon's office.     Change dressing    Complete by:  As directed   You may change your dressing dressing daily with sterile 4 x 4 inch gauze dressing and paper tape.  Do not submerge the incision under water.     Constipation Prevention    Complete by:  As directed   Drink plenty of fluids.  Prune juice may be helpful.  You may use a stool softener, such as Colace (over the counter) 100 mg twice a day.  Use MiraLax (over the counter) for constipation as needed.     Diet - low sodium heart healthy    Complete by:  As directed      Discharge instructions    Complete by:  As directed   Pick up stool softner and laxative for home use following surgery while on pain medications. Do not submerge incision under water. Please use good hand washing techniques while changing dressing each day. May shower starting three days after surgery. Please use a clean towel to pat the incision dry following showers. Continue to use ice for pain and swelling after surgery. Do not use any lotions or creams on the incision until instructed by your  surgeon.  Take Xarelto for two and a half more weeks, then discontinue Xarelto. Once the patient has completed the blood thinner regimen, then take a Baby 81 mg Aspirin daily for three more weeks.  Postoperative Constipation Protocol  Constipation - defined medically as fewer than three stools per week and severe constipation as less than one stool per week.  One of the most common issues patients have following surgery is constipation.  Even if you have a regular bowel pattern at home, your normal regimen is likely to be disrupted due to multiple reasons following surgery.  Combination of anesthesia, postoperative narcotics, change in appetite and fluid intake all can affect your bowels.  In order to avoid complications following surgery, here are some recommendations in order to help you during your recovery period.  Colace (docusate) - Pick up an over-the-counter form of Colace or another stool softener and take twice a day as  long as you are requiring postoperative pain medications.  Take with a full glass of water daily.  If you experience loose stools or diarrhea, hold the colace until you stool forms back up.  If your symptoms do not get better within 1 week or if they get worse, check with your doctor.  Dulcolax (bisacodyl) - Pick up over-the-counter and take as directed by the product packaging as needed to assist with the movement of your bowels.  Take with a full glass of water.  Use this product as needed if not relieved by Colace only.   MiraLax (polyethylene glycol) - Pick up over-the-counter to have on hand.  MiraLax is a solution that will increase the amount of water in your bowels to assist with bowel movements.  Take as directed and can mix with a glass of water, juice, soda, coffee, or tea.  Take if you go more than two days without a movement. Do not use MiraLax more than once per day. Call your doctor if you are still constipated or irregular after using this medication for 7  days in a row.  If you continue to have problems with postoperative constipation, please contact the office for further assistance and recommendations.  If you experience "the worst abdominal pain ever" or develop nausea or vomiting, please contact the office immediatly for further recommendations for treatment.     Do not sit on low chairs, stoools or toilet seats, as it may be difficult to get up from low surfaces    Complete by:  As directed      Driving restrictions    Complete by:  As directed   No driving until released by the physician.     Increase activity slowly as tolerated    Complete by:  As directed      Lifting restrictions    Complete by:  As directed   No lifting until released by the physician.     Patient may shower    Complete by:  As directed   You may shower without a dressing once there is no drainage.  Do not wash over the wound.  If drainage remains, do not shower until drainage stops.     TED hose    Complete by:  As directed   Use stockings (TED hose) for 3 weeks on both leg(s).  You may remove them at night for sleeping.     Weight bearing as tolerated    Complete by:  As directed   Laterality:  left  Extremity:  Lower            Medication List    STOP taking these medications        calcium carbonate 200 MG capsule     Melatonin 5 MG Caps     multivitamin with minerals Tabs tablet     OVER THE COUNTER MEDICATION      TAKE these medications        hydrocortisone valerate cream 0.2 %  Commonly known as:  WESTCORT  Apply 1 application topically as needed. Patient uses as needed  To face for rosacea     methocarbamol 500 MG tablet  Commonly known as:  ROBAXIN  Take 1 tablet (500 mg total) by mouth every 6 (six) hours as needed for muscle spasms.     metroNIDAZOLE 0.75 % cream  Commonly known as:  METROCREAM  Apply 1 application topically daily as needed (for rosacea).     oxyCODONE 5 MG immediate  release tablet  Commonly known as:  Oxy  IR/ROXICODONE  Take 1-2 tablets (5-10 mg total) by mouth every 3 (three) hours as needed for moderate pain, severe pain or breakthrough pain.     potassium chloride 10 MEQ tablet  Commonly known as:  K-DUR,KLOR-CON  Take 20 mEq by mouth 2 (two) times daily.     rivaroxaban 10 MG Tabs tablet  Commonly known as:  XARELTO  - Take 1 tablet (10 mg total) by mouth daily with breakfast. Take Xarelto for two and a half more weeks, then discontinue Xarelto.  - Once the patient has completed the blood thinner regimen, then take a Baby 81 mg Aspirin daily for three more weeks.     traMADol 50 MG tablet  Commonly known as:  ULTRAM  Take 1-2 tablets (50-100 mg total) by mouth every 6 (six) hours as needed (mild pain).     triamterene-hydrochlorothiazide 37.5-25 MG per capsule  Commonly known as:  DYAZIDE  Take 1 capsule by mouth every morning.     valACYclovir 500 MG tablet  Commonly known as:  VALTREX  Take 500 mg by mouth daily. Patient takes at lunch       Follow-up Information    Follow up with Gearlean Alf, MD. Schedule an appointment as soon as possible for a visit on 07/30/2014.   Specialty:  Orthopedic Surgery   Why:  Call office at [phone] to setup appointment with Dr. Wynelle Link.   Contact information:   [address] [place] [address] [phone]       Follow up with [facility]   Why:  home health physical therapy   Contact information:   [address] [place] [address] [phone]       Signed: Arlee Muslim, PA-C Orthopaedic Surgery 07/24/2014, 10:28 PM

## 2014-07-18 NOTE — Progress Notes (Signed)
   Subjective: 1 Day Post-Op Procedure(s) (LRB): LEFT TOTAL HIP ARTHROPLASTY ANTERIOR APPROACH (Left) Patient reports pain as mild.   Patient seen in rounds by Dr. Wynelle Link.  She was doing well on day one and had a decent night. Patient is well, and has had no acute complaints or problems Patient got up and walked 90 feet on the day of surgery.  If she does really well today and wants to go home, possible discharge this evening, otherwise tomorrow.  Will set up RXs and paperwork for possible discharge.  Objective: Vital signs in last 24 hours: Temp:  [97.3 F (36.3 C)-98.5 F (36.9 C)] 97.8 F (36.6 C) (02/25 0617) Pulse Rate:  [63-88] 81 (02/25 0617) Resp:  [10-20] 18 (02/25 0617) BP: (116-170)/(59-91) 130/60 mmHg (02/25 0617) SpO2:  [95 %-100 %] 98 % (02/25 0617)  Intake/Output from previous day:  Intake/Output Summary (Last 24 hours) at 07/18/14 0804 Last data filed at 07/18/14 0618  Gross per 24 hour  Intake 4013.75 ml  Output   4615 ml  Net -601.25 ml    Intake/Output this shift: UOP 1600  Labs:  Recent Labs  07/18/14 0545  HGB 11.0*    Recent Labs  07/18/14 0545  WBC 19.1*  RBC 3.43*  HCT 31.1*  PLT 247    Recent Labs  07/18/14 0545  NA 135  K 2.8*  CL 100  CO2 26  BUN 11  CREATININE 0.63  GLUCOSE 177*  CALCIUM 8.7   No results for input(s): LABPT, INR in the last 72 hours.  EXAM: General - Patient is Alert, Appropriate and Oriented Extremity - Neurovascular intact Sensation intact distally Dressing - clean, dry Motor Function - intact, moving foot and toes well on exam.   Assessment/Plan: 1 Day Post-Op Procedure(s) (LRB): LEFT TOTAL HIP ARTHROPLASTY ANTERIOR APPROACH (Left) Procedure(s) (LRB): LEFT TOTAL HIP ARTHROPLASTY ANTERIOR APPROACH (Left) Past Medical History  Diagnosis Date  . Family history of adverse reaction to anesthesia     father- had fall- hip surgery - memory loss then had pneumonia   . Hypertension   . GERD  (gastroesophageal reflux disease)   . Arthritis    Principal Problem:   OA (osteoarthritis) of hip  Estimated body mass index is 24.19 kg/(m^2) as calculated from the following:   Height as of this encounter: _0  (1.626 m).   Weight as of this encounter: 63.957 kg (141 lb). Up with therapy Discharge home with home health when met all goals Diet - Cardiac diet Follow up - in 2 weeks Activity - WBAT Disposition - Home Condition Upon Discharge - Pending D/C Meds - See DC Summary DVT Prophylaxis - Xarelto  Arlee Muslim, PA-C Orthopaedic Surgery 07/18/2014, 8:04 AM

## 2014-07-18 NOTE — Care Management Note (Signed)
    Page 1 of 1   07/18/2014     2:14:58 PM CARE MANAGEMENT NOTE 07/18/2014  Patient:  Connie Lindsey   Account Number:  [id number]  Date Initiated:  07/18/2014  Documentation initiated by:  [facility],Marien Manship  Subjective/Objective Assessment:   adm: LEFT TOTAL HIP ARTHROPLASTY ANTERIOR APPROACH (Left)     Action/Plan:   discharge planning   Anticipated DC Date:  07/19/2014   Anticipated DC Plan:  HOME W HOME HEALTH SERVICES      DC Planning Services  CM consult      [facility]   Choice offered to / List presented to:  C-1 Patient        HH arranged  HH-2 PT      [facility]   Status of service:  Completed, signed off Medicare Important Message given?   (If response is "NO", the following Medicare IM given date fields will be blank) Date Medicare IM given:   Medicare IM given by:   Date Additional Medicare IM given:   Additional Medicare IM given by:    Discharge Disposition:  HOME W HOME HEALTH SERVICES  Per UR Regulation:    If discussed at Long Length of Stay Meetings, dates discussed:    Comments:  07/18/14 14:10 CMmet with pt in room to offer choice of home health agency.  Pt chooses [facility] to render HHPT.  Address and contact information verified by pt.  Referral emailed to [employer] rep, Tim.  Pt has both rolling walker and commode.  No other CM needs were communicated.  Connie Lindsey, BSN, CM [phone].

## 2014-07-19 LAB — BASIC METABOLIC PANEL
Anion gap: 6 (ref 5–15)
BUN: 11 mg/dL (ref 6–23)
CALCIUM: 8.4 mg/dL (ref 8.4–10.5)
CO2: 27 mmol/L (ref 19–32)
Chloride: 100 mmol/L (ref 96–112)
Creatinine, Ser: 0.59 mg/dL (ref 0.50–1.10)
Glucose, Bld: 127 mg/dL — ABNORMAL HIGH (ref 70–99)
POTASSIUM: 3.4 mmol/L — AB (ref 3.5–5.1)
SODIUM: 133 mmol/L — AB (ref 135–145)

## 2014-07-19 LAB — CBC
HCT: 31.3 % — ABNORMAL LOW (ref 36.0–46.0)
HEMOGLOBIN: 10.9 g/dL — AB (ref 12.0–15.0)
MCH: 31.7 pg (ref 26.0–34.0)
MCHC: 34.8 g/dL (ref 30.0–36.0)
MCV: 91 fL (ref 78.0–100.0)
Platelets: 229 10*3/uL (ref 150–400)
RBC: 3.44 MIL/uL — ABNORMAL LOW (ref 3.87–5.11)
RDW: 14.2 % (ref 11.5–15.5)
WBC: 20.5 10*3/uL — ABNORMAL HIGH (ref 4.0–10.5)

## 2014-07-19 NOTE — Progress Notes (Signed)
   Subjective: 2 Days Post-Op Procedure(s) (LRB): LEFT TOTAL HIP ARTHROPLASTY ANTERIOR APPROACH (Left) Patient reports pain as mild.   Patient is well, and has had no acute complaints or problems. No issues overnight. Reports that therapy is going well.  Plan is to go Home after hospital stay.  Objective: Vital signs in last 24 hours: Temp:  [97.9 F (36.6 C)-98.7 F (37.1 C)] 98.7 F (37.1 C) (02/26 0613) Pulse Rate:  [66-71] 71 (02/26 0613) Resp:  [16] 16 (02/26 0613) BP: (132-133)/(65-68) 132/65 mmHg (02/26 0613) SpO2:  [97 %-98 %] 98 % (02/26 0613)  Intake/Output from previous day:  Intake/Output Summary (Last 24 hours) at 07/19/14 1503 Last data filed at 07/19/14 0830  Gross per 24 hour  Intake    360 ml  Output      0 ml  Net    360 ml    Intake/Output this shift: Total I/O In: 240 [P.O.:240] Out: -   Labs:  Recent Labs  07/18/14 0545 07/19/14 0500  HGB 11.0* 10.9*    Recent Labs  07/18/14 0545 07/19/14 0500  WBC 19.1* 20.5*  RBC 3.43* 3.44*  HCT 31.1* 31.3*  PLT 247 229    Recent Labs  07/18/14 0545 07/19/14 0500  NA 135 133*  K 2.8* 3.4*  CL 100 100  CO2 26 27  BUN 11 11  CREATININE 0.63 0.59  GLUCOSE 177* 127*  CALCIUM 8.7 8.4    EXAM General - Patient is Alert and Oriented Extremity - Neurologically intact Intact pulses distally Dorsiflexion/Plantar flexion intact No cellulitis present Compartment soft Dressing/Incision - clean, dry, no drainage Motor Function - intact, moving foot and toes well on exam.   Past Medical History  Diagnosis Date  . Family history of adverse reaction to anesthesia     father- had fall- hip surgery - memory loss then had pneumonia   . Hypertension   . GERD (gastroesophageal reflux disease)   . Arthritis     Assessment/Plan: 2 Days Post-Op Procedure(s) (LRB): LEFT TOTAL HIP ARTHROPLASTY ANTERIOR APPROACH (Left) Principal Problem:   OA (osteoarthritis) of hip  Estimated body mass index is  24.19 kg/(m^2) as calculated from the following:   Height as of this encounter: 5\' 4"  (1.626 m).   Weight as of this encounter: 63.957 kg (141 lb). Advance diet Up with therapy Discharge home with home health  DVT Prophylaxis - Xarelto Weight Bearing As Tolerated   Patient doing great. Therapy going well. Continue PT today then discharge home.   Dimitri PedAmber Sebastion Jun, PA-C Orthopaedic Surgery 07/19/2014, 3:03 PM

## 2014-07-19 NOTE — Plan of Care (Signed)
Problem: Phase III Progression Outcomes Goal: Anticoagulant follow-up in place Outcome: Not Applicable Date Met:  78/93/81 Xarelto VTE, no f/u needed.

## 2014-07-19 NOTE — Progress Notes (Signed)
Physical Therapy Treatment Patient Details Name: Connie Lindsey MRN: [id number] DOB: 03/06/1946 Today's Date: 07/19/2014    History of Present Illness L THR    PT Comments    POD # 3 am session pt feeling much better and admitting taking self to bathroom early.  amb in hallway and practiced going up 2 steps backward with walker due to no rails and practiced going up one step forward with RW.  Returned to room and performed all THR TE's following handout HEP followed by ICE.   Pt ready for D/C to home.    Follow Up Recommendations  Home health PT     Equipment Recommendations  None recommended by PT    Recommendations for Other Services       Precautions / Restrictions Precautions Precautions: Fall Restrictions Weight Bearing Restrictions: No Other Position/Activity Restrictions: WBAT    Mobility  Bed Mobility               General bed mobility comments: Pt OOB in recliner  Transfers Overall transfer level: Modified independent Equipment used: Rolling walker (2 wheeled) Transfers: Sit to/from Stand Sit to Stand: Modified independent (Device/Increase time)         General transfer comment: good safety cognition and use of hands  Ambulation/Gait Ambulation/Gait assistance: Supervision Ambulation Distance (Feet): 225 Feet Assistive device: Rolling walker (2 wheeled) Gait Pattern/deviations: Step-to pattern Gait velocity: decreased   General Gait Details: pt feeling much better.  Good alternating gait and < 25% VC's on safety with turns and backward gait.     Stairs Stairs: Yes Stairs assistance: Min assist Stair Management: No rails;Step to pattern;Backwards;With walker Number of Stairs: 2 General stair comments: 25% VC's on proper tech and walker placement plus sequencing.  Handout also given.    Wheelchair Mobility    Modified Rankin (Stroke Patients Only)       Balance                                    Cognition  Arousal/Alertness: Awake/alert Behavior During Therapy: WFL for tasks assessed/performed Overall Cognitive Status: Within Functional Limits for tasks assessed                      Exercises      General Comments        Pertinent Vitals/Pain Pain Assessment: 0-10 Pain Score: 3  Pain Location: L hip Pain Descriptors / Indicators: Aching Pain Intervention(s): Monitored during session;Repositioned;Premedicated before session;Ice applied    Home Living                      Prior Function            PT Goals (current goals can now be found in the care plan section) Progress towards PT goals: Progressing toward goals    Frequency  7X/week    PT Plan      Co-evaluation             End of Session Equipment Utilized During Treatment: Gait belt Activity Tolerance: Patient tolerated treatment well       Time: 1005-1045 PT Time Calculation (min) (ACUTE ONLY): 40 min  Charges:  $Gait Training: 8-22 mins $Therapeutic Exercise: 8-22 mins $Therapeutic Activity: 8-22 mins                    G Codes:  Rica Koyanagi  PTA [facility]  Acute  Rehab Pager      [phone]

## 2014-12-03 ENCOUNTER — Other Ambulatory Visit: Payer: Self-pay

## 2014-12-03 DIAGNOSIS — Z1231 Encounter for screening mammogram for malignant neoplasm of breast: Secondary | ICD-10-CM

## 2015-01-06 ENCOUNTER — Ambulatory Visit: Payer: Medicare Other

## 2015-01-07 ENCOUNTER — Ambulatory Visit
Admission: RE | Admit: 2015-01-07 | Discharge: 2015-01-07 | Disposition: A | Discharge status: Home / Self Care | Payer: Medicare Other | Source: Ambulatory Visit

## 2015-01-07 DIAGNOSIS — Z1231 Encounter for screening mammogram for malignant neoplasm of breast: Secondary | ICD-10-CM

## 2015-12-08 ENCOUNTER — Other Ambulatory Visit: Payer: Self-pay | Admitting: Family Medicine

## 2015-12-08 DIAGNOSIS — Z1231 Encounter for screening mammogram for malignant neoplasm of breast: Secondary | ICD-10-CM

## 2016-01-08 ENCOUNTER — Ambulatory Visit
Admission: RE | Admit: 2016-01-08 | Discharge: 2016-01-08 | Disposition: A | Discharge status: Home / Self Care | Payer: Medicare Other | Source: Ambulatory Visit | Attending: Family Medicine | Admitting: Family Medicine

## 2016-01-08 DIAGNOSIS — Z1231 Encounter for screening mammogram for malignant neoplasm of breast: Secondary | ICD-10-CM

## 2016-05-27 ENCOUNTER — Other Ambulatory Visit: Payer: Self-pay | Admitting: Family Medicine

## 2016-05-27 ENCOUNTER — Ambulatory Visit
Admission: RE | Admit: 2016-05-27 | Discharge: 2016-05-27 | Disposition: A | Discharge status: Home / Self Care | Payer: Medicare Other | Source: Ambulatory Visit | Attending: Family Medicine | Admitting: Family Medicine

## 2016-05-27 DIAGNOSIS — R059 Cough, unspecified: Secondary | ICD-10-CM

## 2016-05-27 DIAGNOSIS — R05 Cough: Secondary | ICD-10-CM

## 2016-05-27 IMAGING — CR DG CHEST 2V
2 series · 2 of 2 positions shown · non-contrast
Comparison: Chest x-ray of [DATE]

CLINICAL DATA: Cough for 1 month, short of breath, chest pain

EXAM:
CHEST  2 VIEW

[w chest pa]
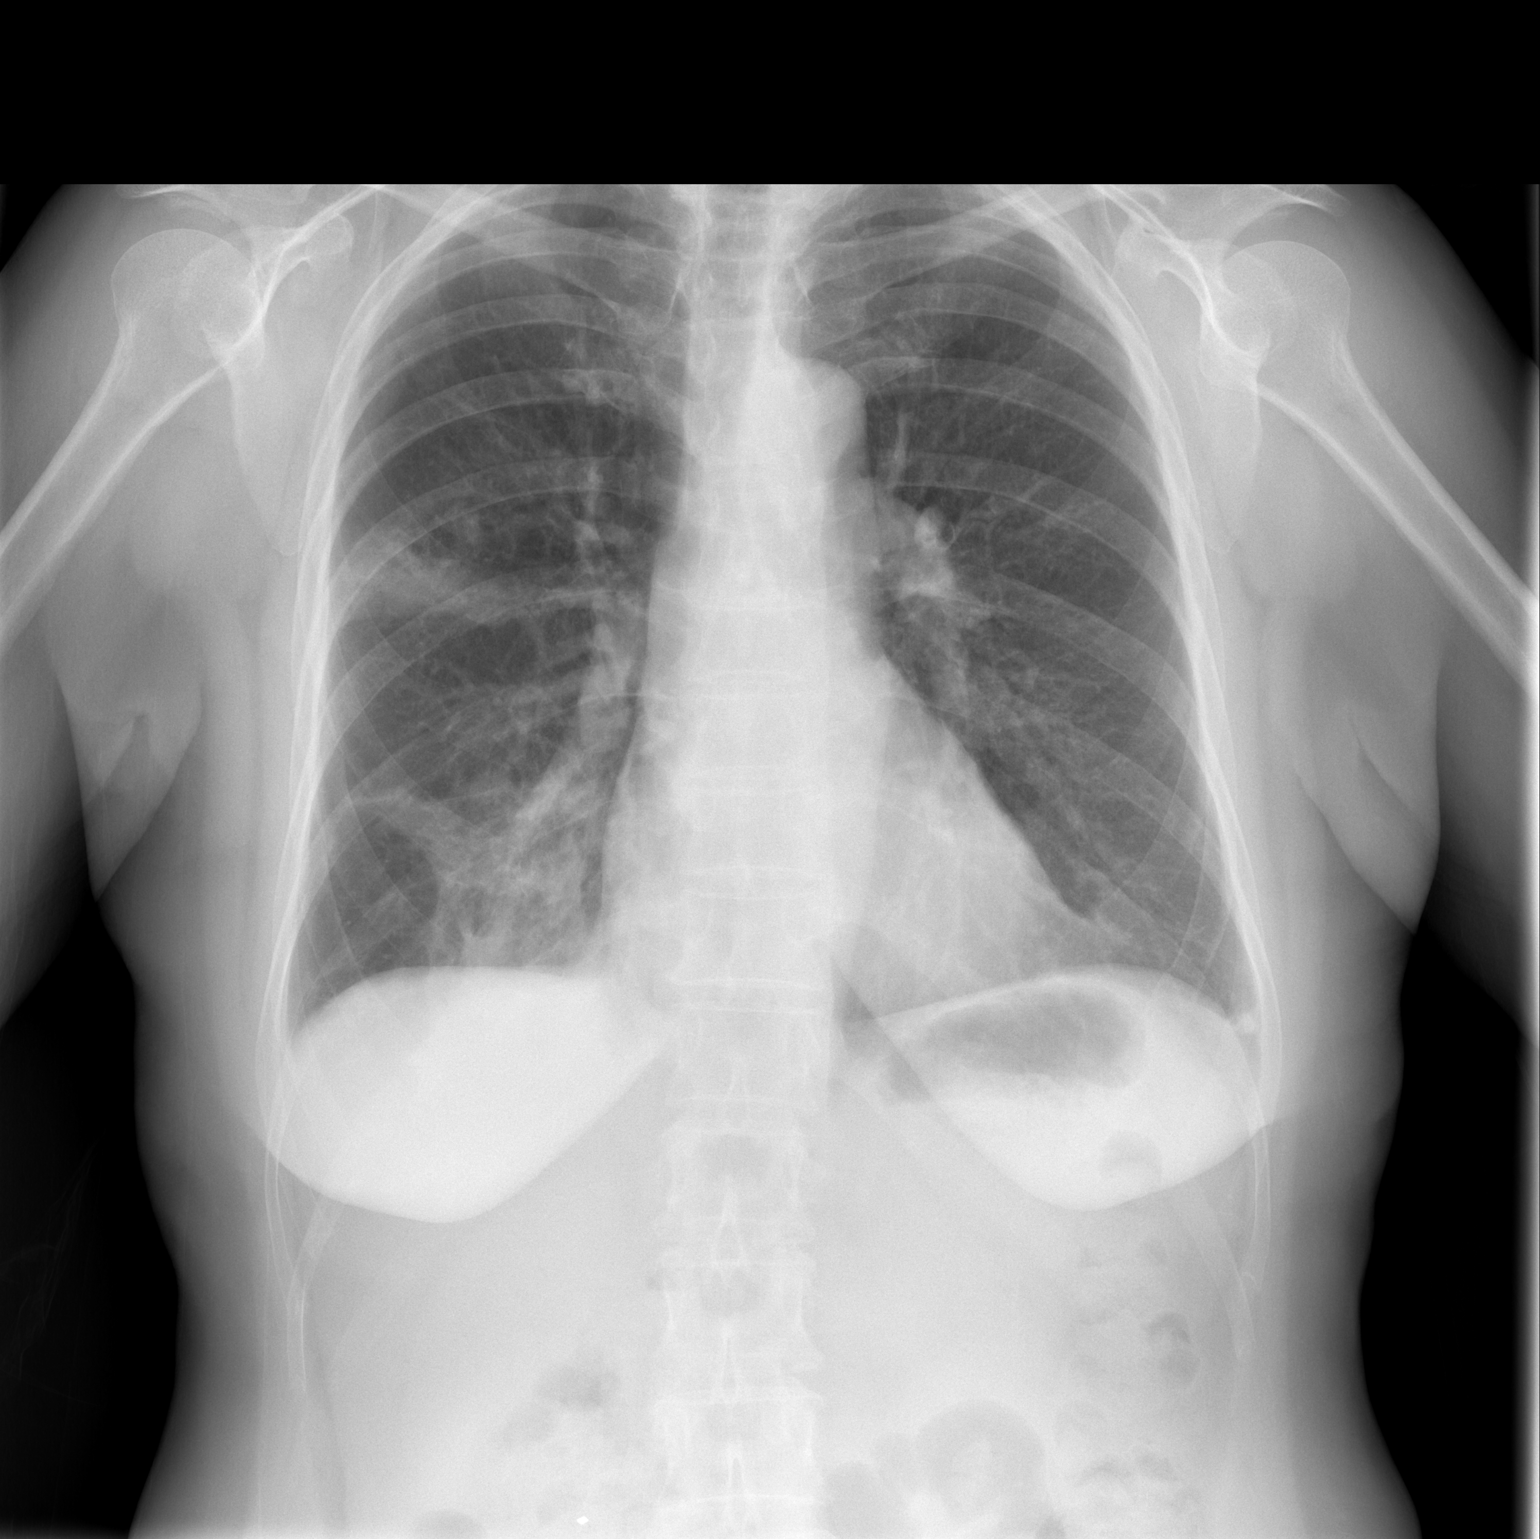

[w chest lat]
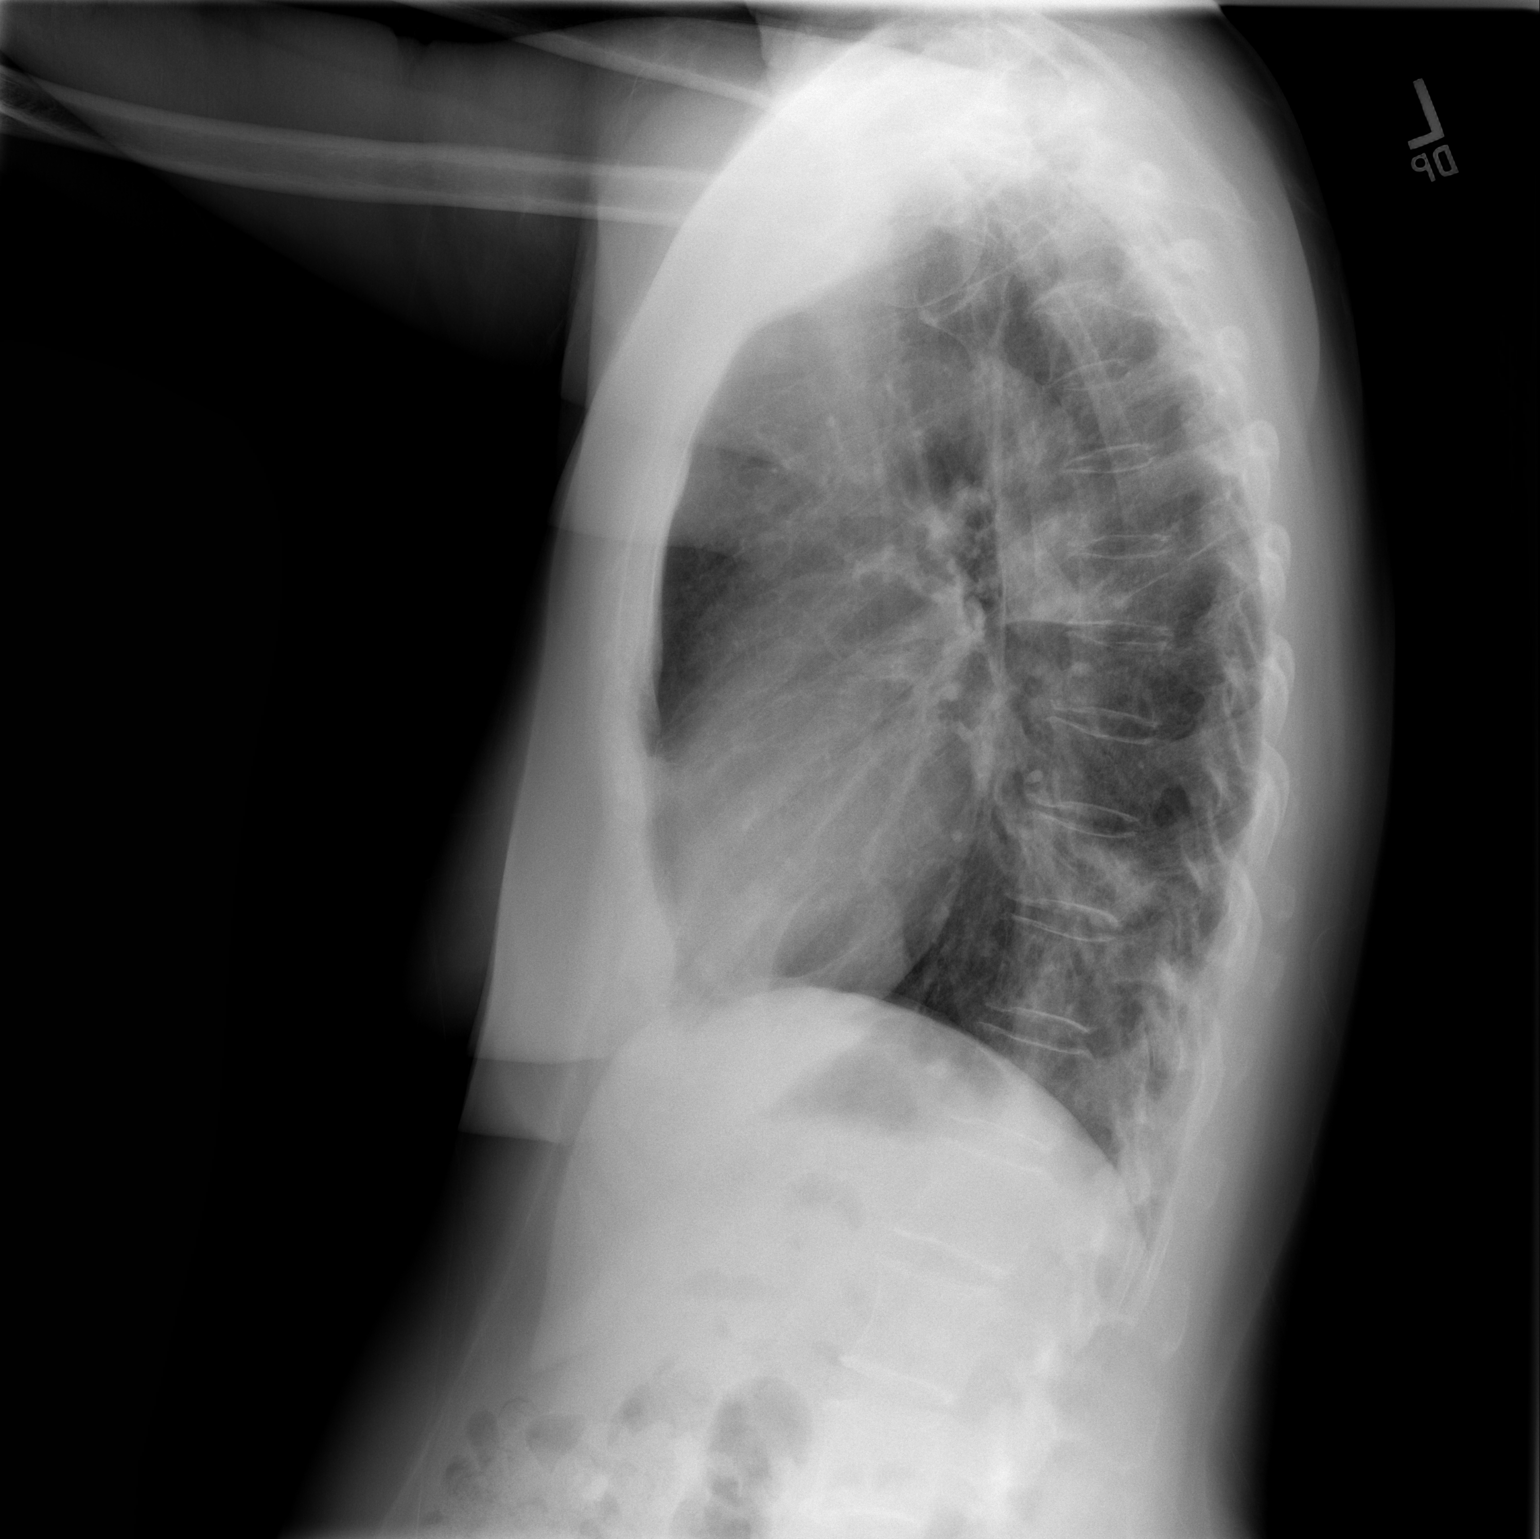

[2 of 2 positions shown; findings below may reference images not displayed]

FINDINGS: There are parenchymal opacities in the right upper lobe posteriorly
and in the posterior right lower lobe consistent with multifocal
pneumonia. The left lung is clear. No effusion is seen. A granuloma
at the left costophrenic angle is stable. The heart is within normal
limits in size. No bony abnormality is seen.
IMPRESSION: Multifocal pneumonia involving the right lung. Recommend followup to
ensure clearing.

## 2016-06-24 ENCOUNTER — Ambulatory Visit
Admission: RE | Admit: 2016-06-24 | Discharge: 2016-06-24 | Disposition: A | Discharge status: Home / Self Care | Payer: Medicare Other | Source: Ambulatory Visit | Attending: Family Medicine | Admitting: Family Medicine

## 2016-06-24 ENCOUNTER — Other Ambulatory Visit: Payer: Self-pay | Admitting: Family Medicine

## 2016-06-24 DIAGNOSIS — Z09 Encounter for follow-up examination after completed treatment for conditions other than malignant neoplasm: Secondary | ICD-10-CM

## 2016-06-24 IMAGING — CR DG CHEST 2V
2 series · 2 of 2 positions shown · non-contrast
Comparison: [DATE]

CLINICAL DATA: Cough.  Follow up right-sided pneumonia.

EXAM:
CHEST  2 VIEW

[w chest pa]
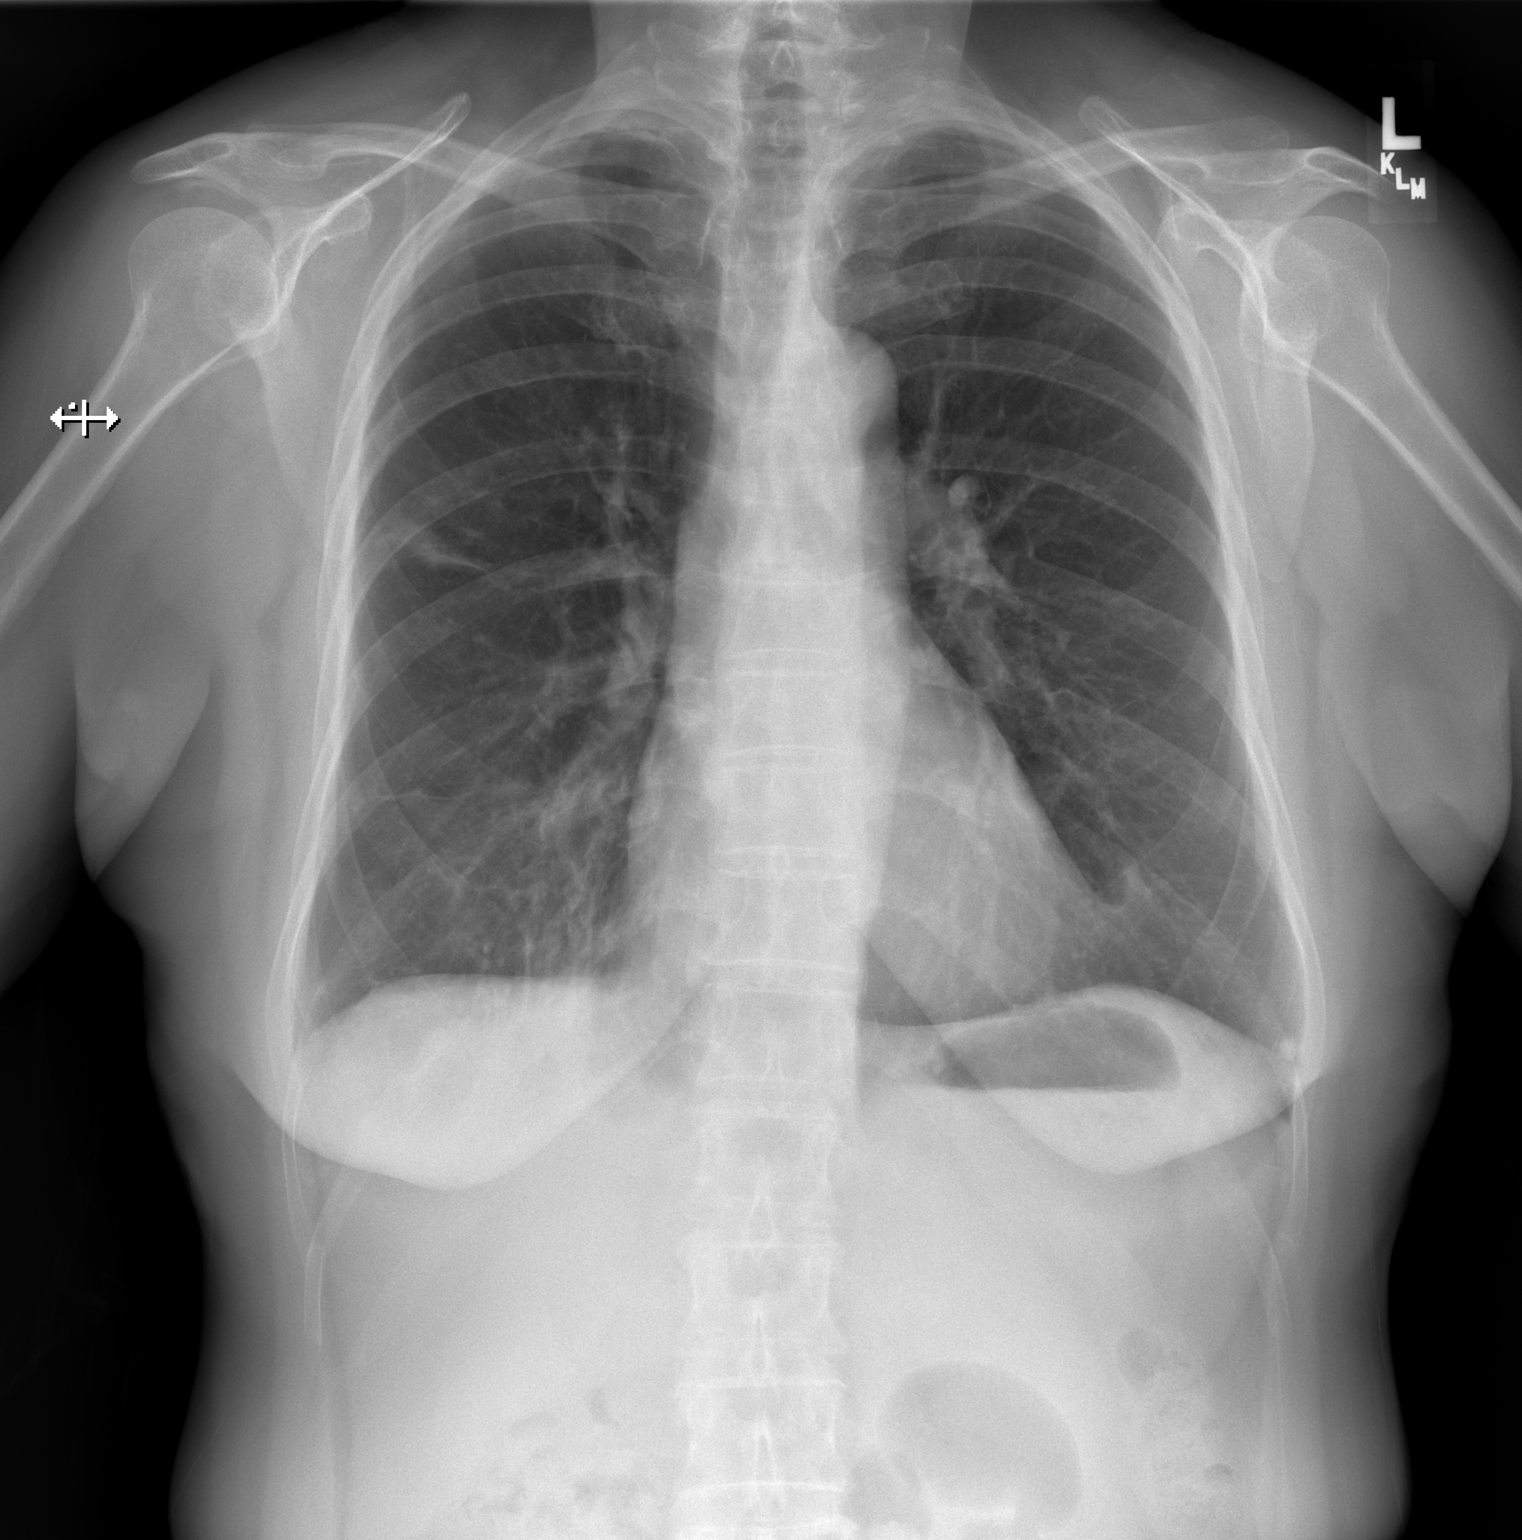

[w chest lat]
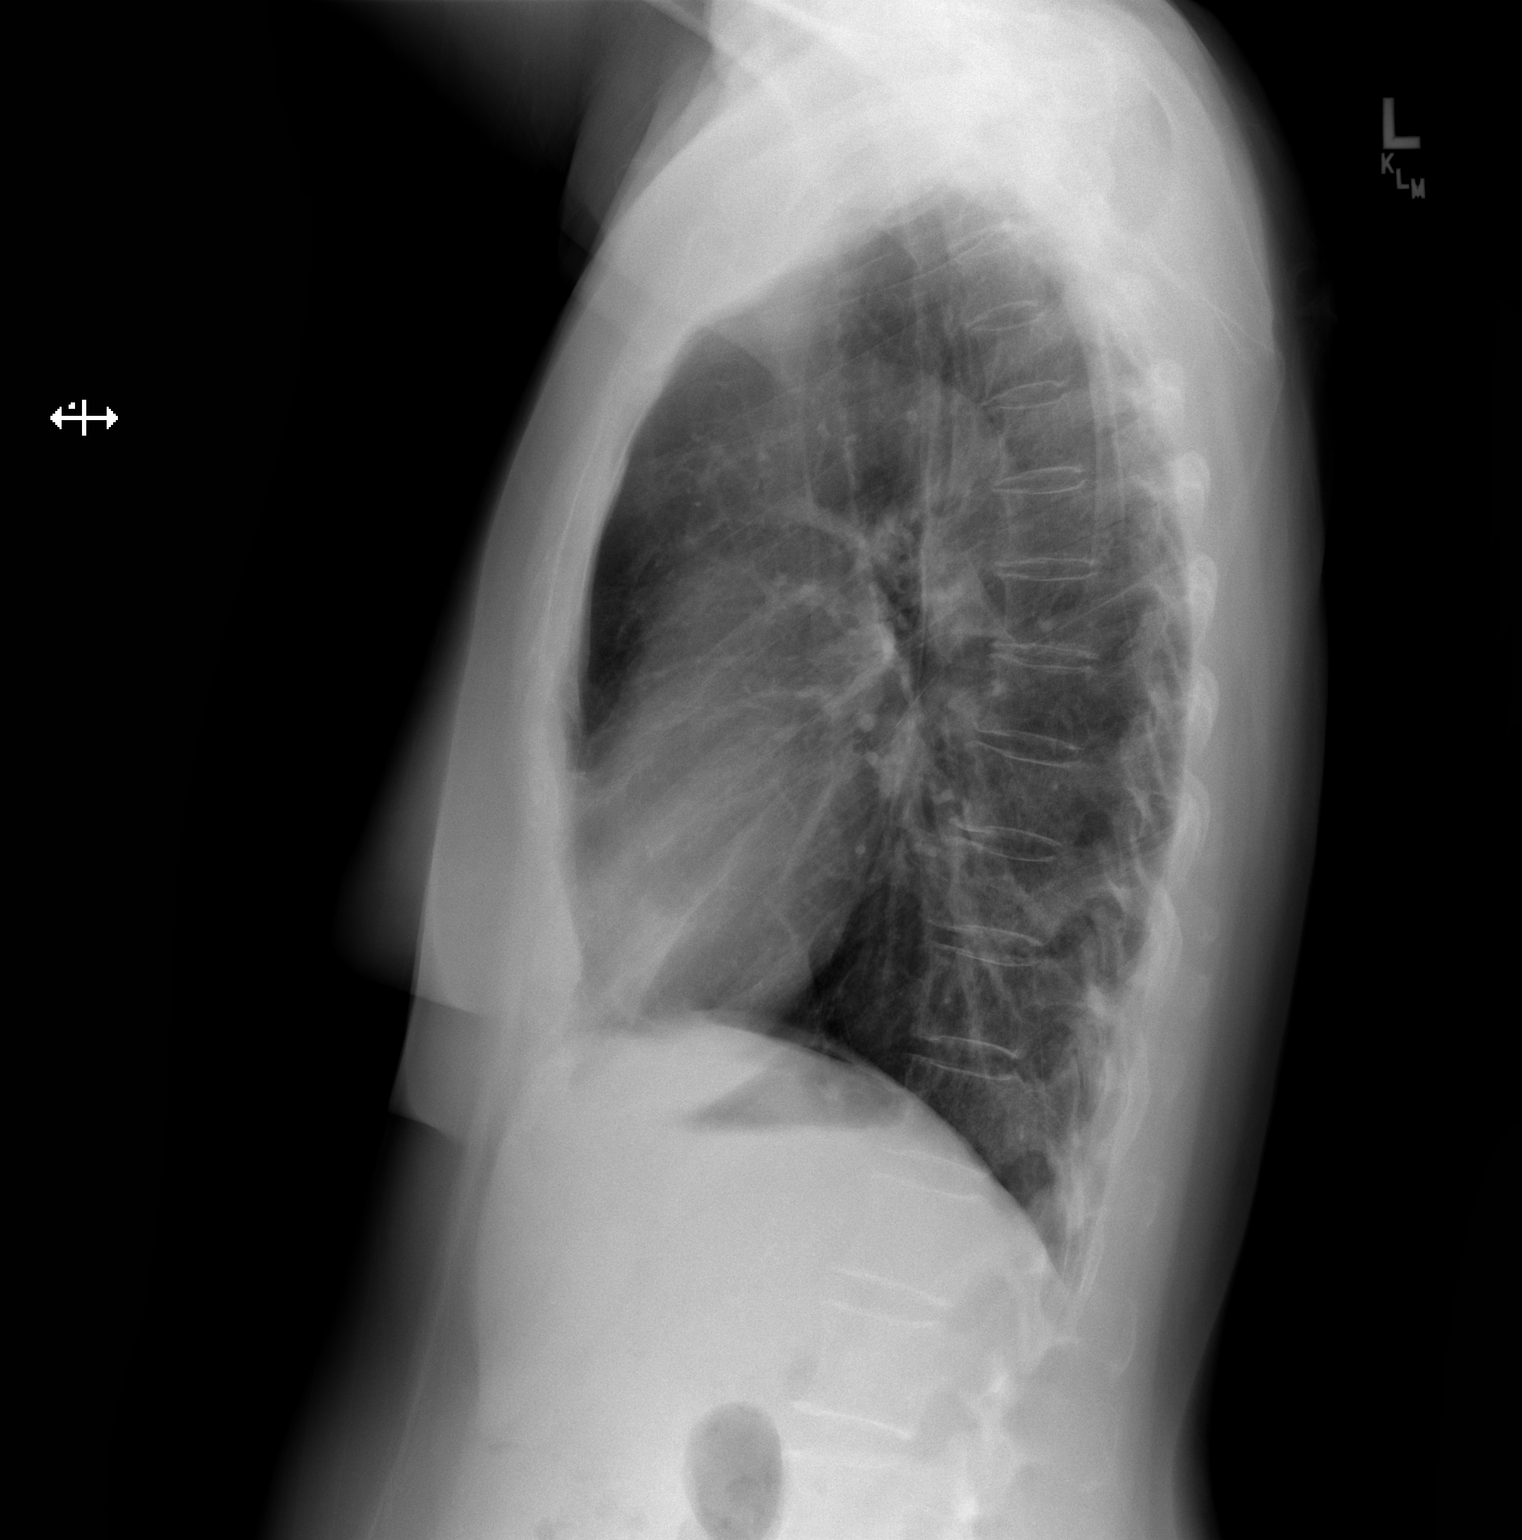

[2 of 2 positions shown; findings below may reference images not displayed]

FINDINGS: Cardiomediastinal silhouette is normal. Mediastinal contours appear
intact.

There is no evidence of new focal airspace consolidation, pleural
effusion or pneumothorax. There is slight residual linear airspace
consolidation in the right upper and lower lobes, at the site of
previously identified pneumonia.

Osseous structures are without acute abnormality. Soft tissues are
grossly normal.
IMPRESSION: Marked improvement in right-sided airspace consolidation, with small
residual sites of linear airspace disease in the right upper and
right lower lobes.

## 2016-11-22 ENCOUNTER — Other Ambulatory Visit: Payer: Self-pay | Admitting: Family Medicine

## 2016-11-22 DIAGNOSIS — Z1231 Encounter for screening mammogram for malignant neoplasm of breast: Secondary | ICD-10-CM

## 2017-01-11 ENCOUNTER — Ambulatory Visit
Admission: RE | Admit: 2017-01-11 | Discharge: 2017-01-11 | Disposition: A | Discharge status: Home / Self Care | Payer: Medicare Other | Source: Ambulatory Visit | Attending: Family Medicine | Admitting: Family Medicine

## 2017-01-11 DIAGNOSIS — Z1231 Encounter for screening mammogram for malignant neoplasm of breast: Secondary | ICD-10-CM

## 2017-01-11 IMAGING — MG 2D DIGITAL SCREENING BILATERAL MAMMOGRAM WITH CAD AND ADJUNCT TO
9 of 12 series · 9 of 28 positions shown · non-contrast
Comparison: Previous exam(s).

CLINICAL DATA: Screening.

EXAM:
2D DIGITAL SCREENING BILATERAL MAMMOGRAM WITH CAD AND ADJUNCT TOMO

[R MLO]
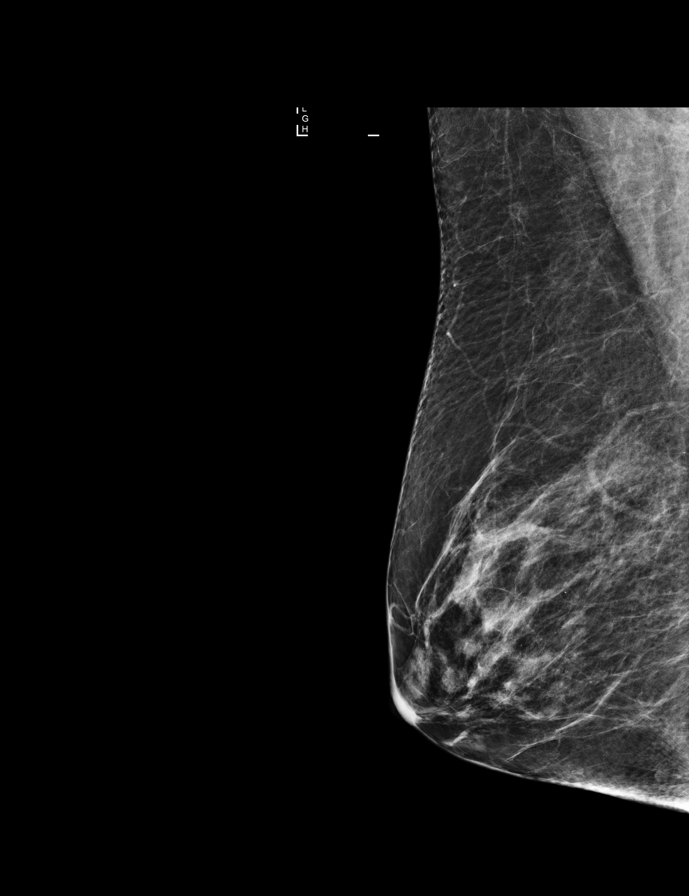

[L MLO synth-2D]
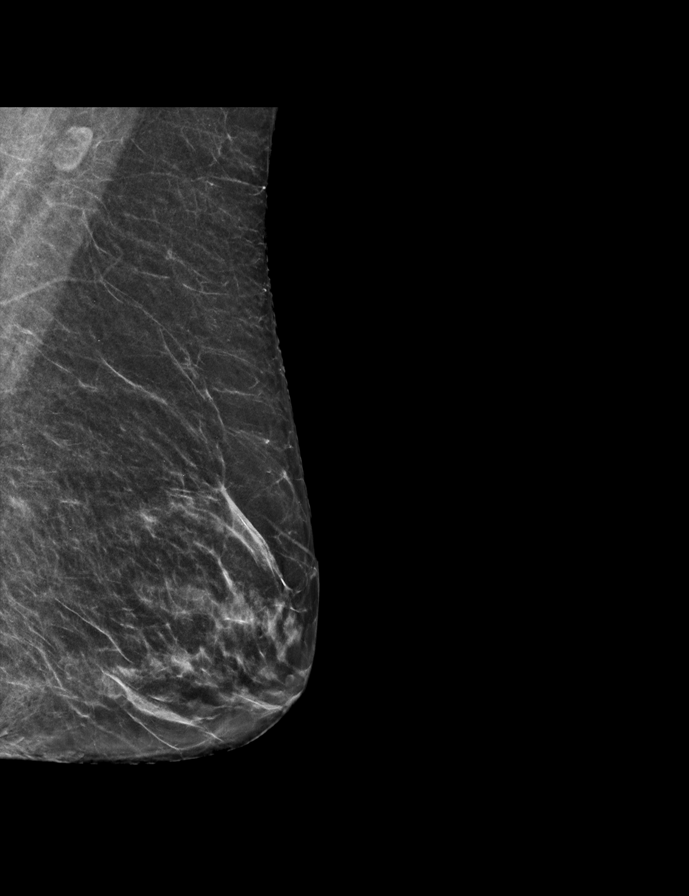

[R MLO synth-2D]
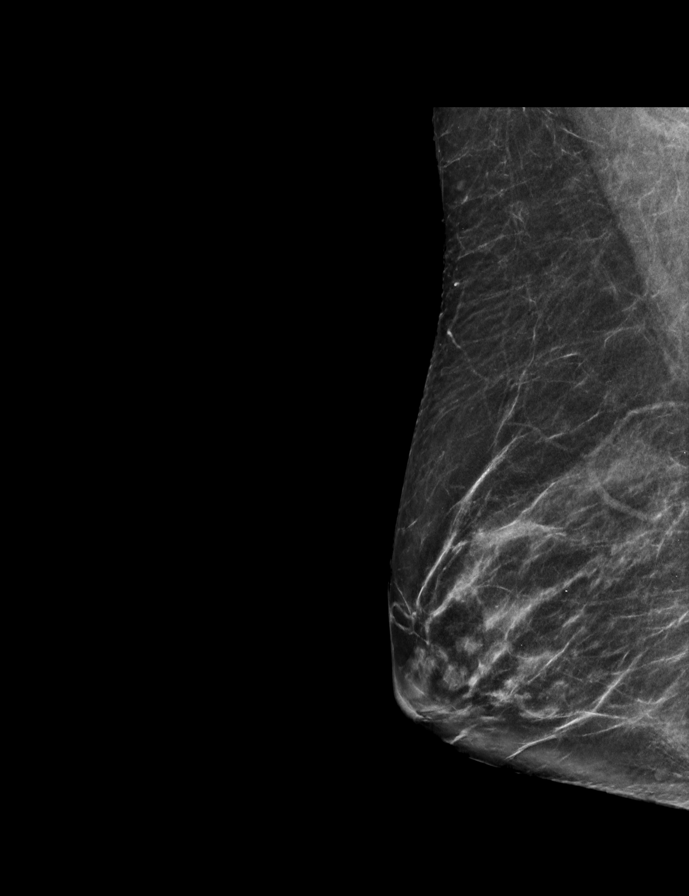

[L CC]
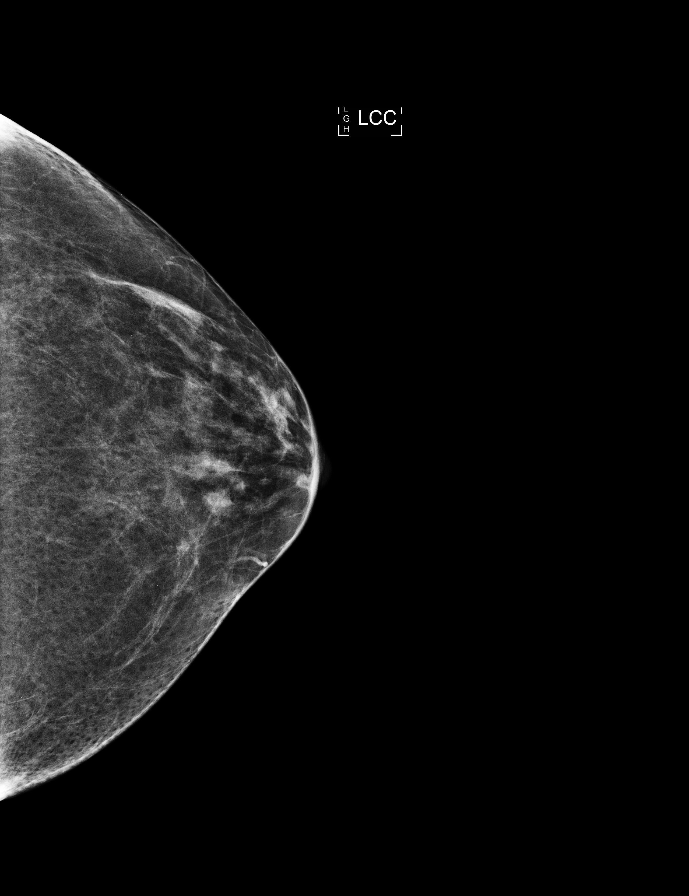

[R CC synth-2D]
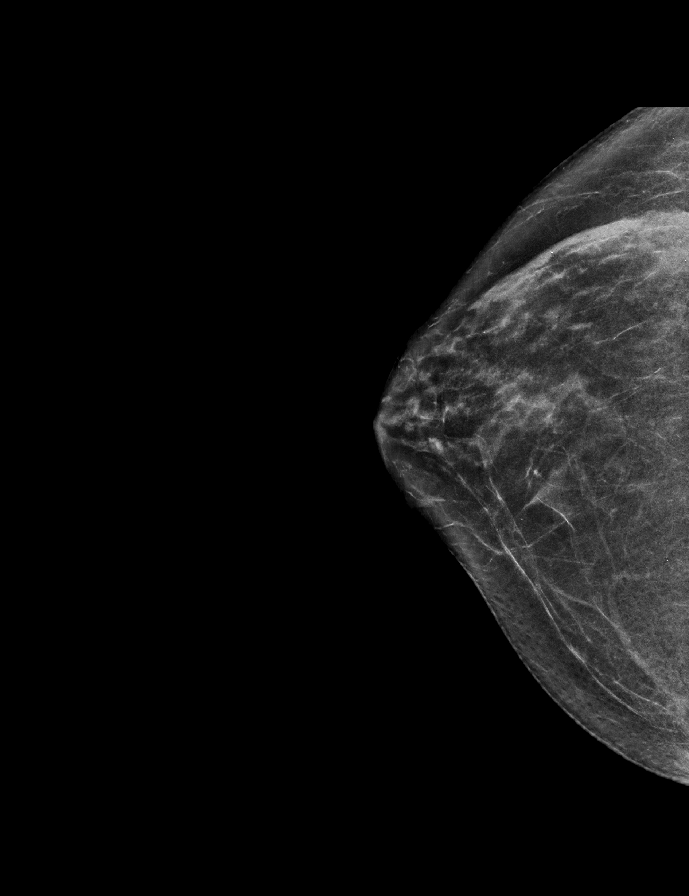

[L MLO]
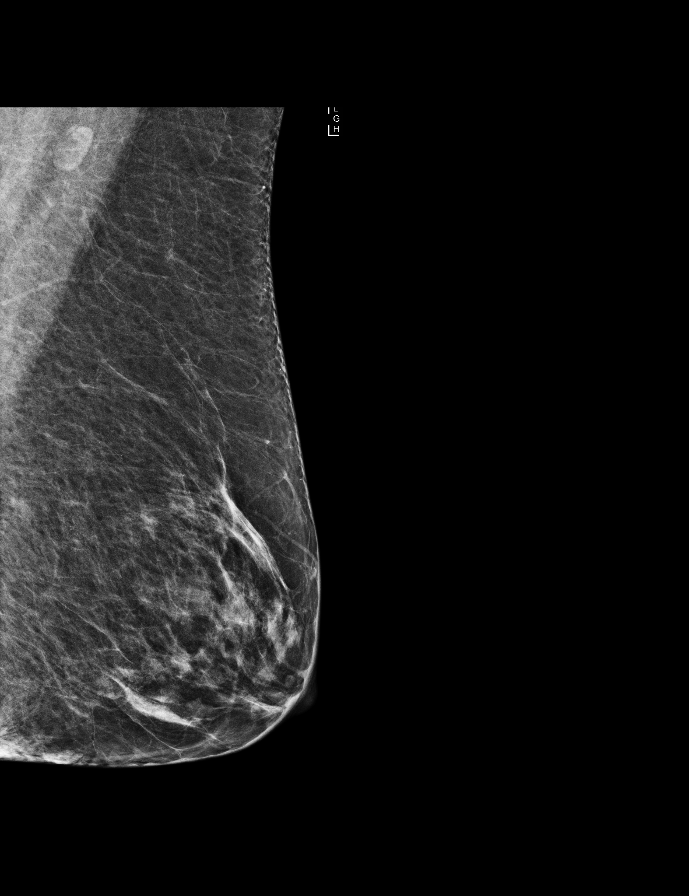

[L CC synth-2D]
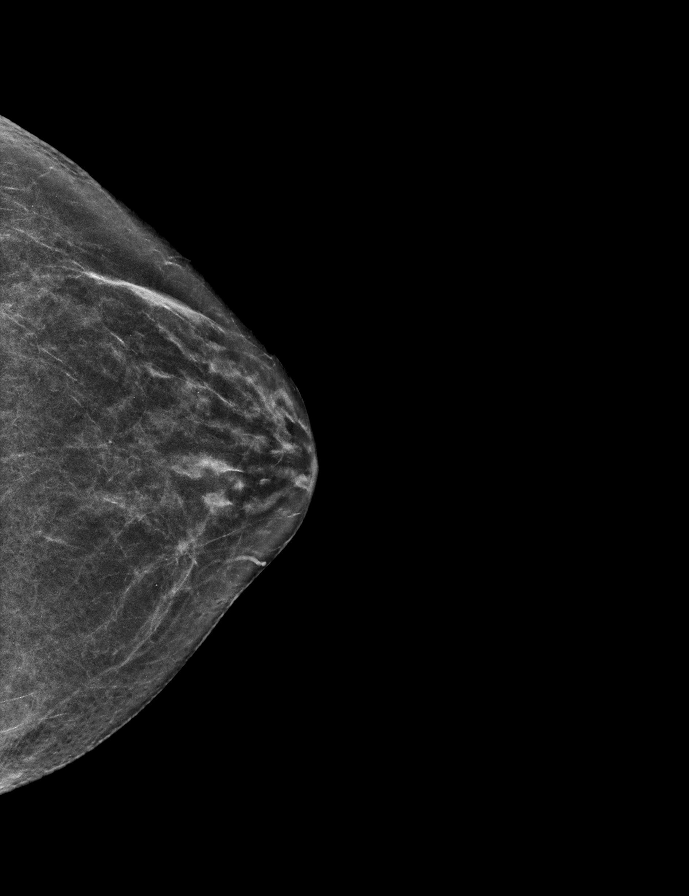

[R CC]
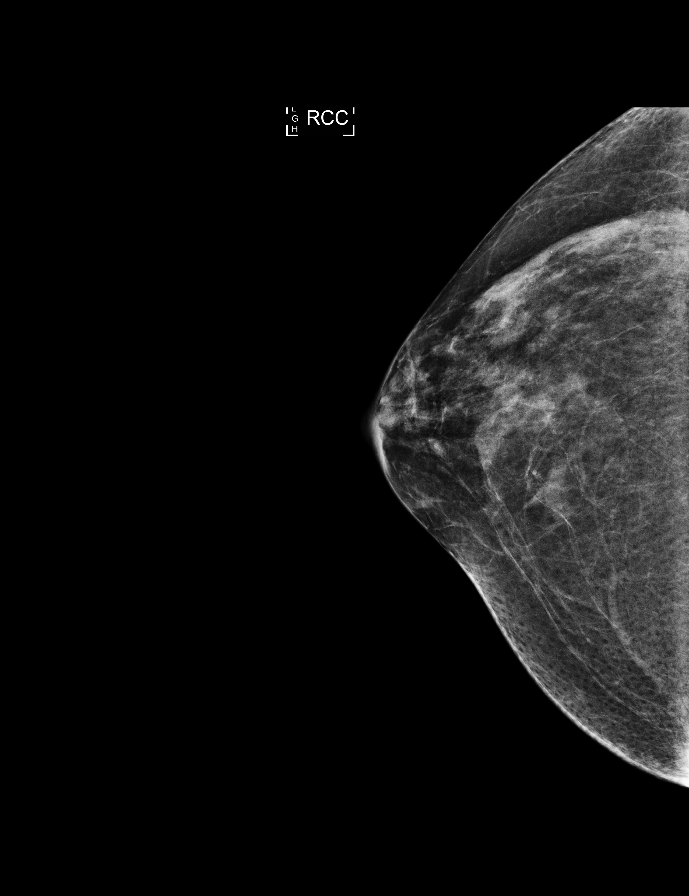

[L CC tomo · tomo slice 26/51.0]
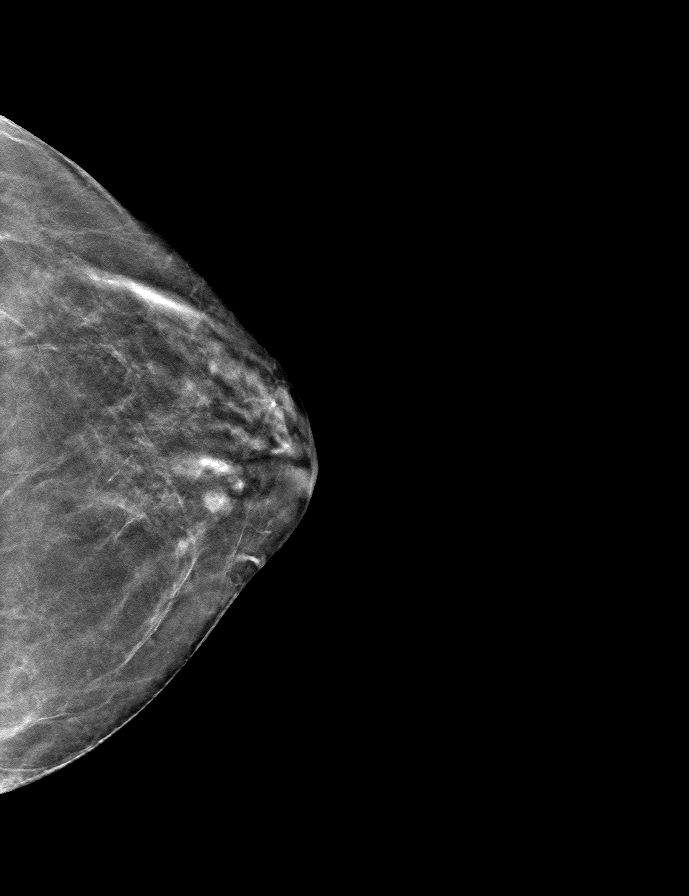

[9 of 28 positions shown; findings below may reference images not displayed]

ACR Breast Density Category b: There are scattered areas of
fibroglandular density.
FINDINGS: There are no findings suspicious for malignancy. Images were
processed with CAD.
IMPRESSION: No mammographic evidence of malignancy. A result letter of this
screening mammogram will be mailed directly to the patient.

RECOMMENDATION:
Screening mammogram in one year. (Code:[33])

BI-RADS CATEGORY  1: Negative.

## 2017-06-09 ENCOUNTER — Ambulatory Visit
Admission: RE | Admit: 2017-06-09 | Discharge: 2017-06-09 | Disposition: A | Discharge status: Home / Self Care | Payer: Medicare Other | Source: Ambulatory Visit | Attending: Gastroenterology | Admitting: Gastroenterology

## 2017-06-09 ENCOUNTER — Other Ambulatory Visit: Payer: Self-pay | Admitting: Gastroenterology

## 2017-06-09 DIAGNOSIS — R05 Cough: Secondary | ICD-10-CM

## 2017-06-09 DIAGNOSIS — R059 Cough, unspecified: Secondary | ICD-10-CM

## 2017-06-09 IMAGING — DX DG CHEST 2V
2 series · 2 of 2 positions shown · non-contrast
Comparison: Chest x-ray of [DATE]

CLINICAL DATA: Cough, congestion for 2 months

EXAM:
CHEST  2 VIEW

[dg chest 2 view (1 of 2)]
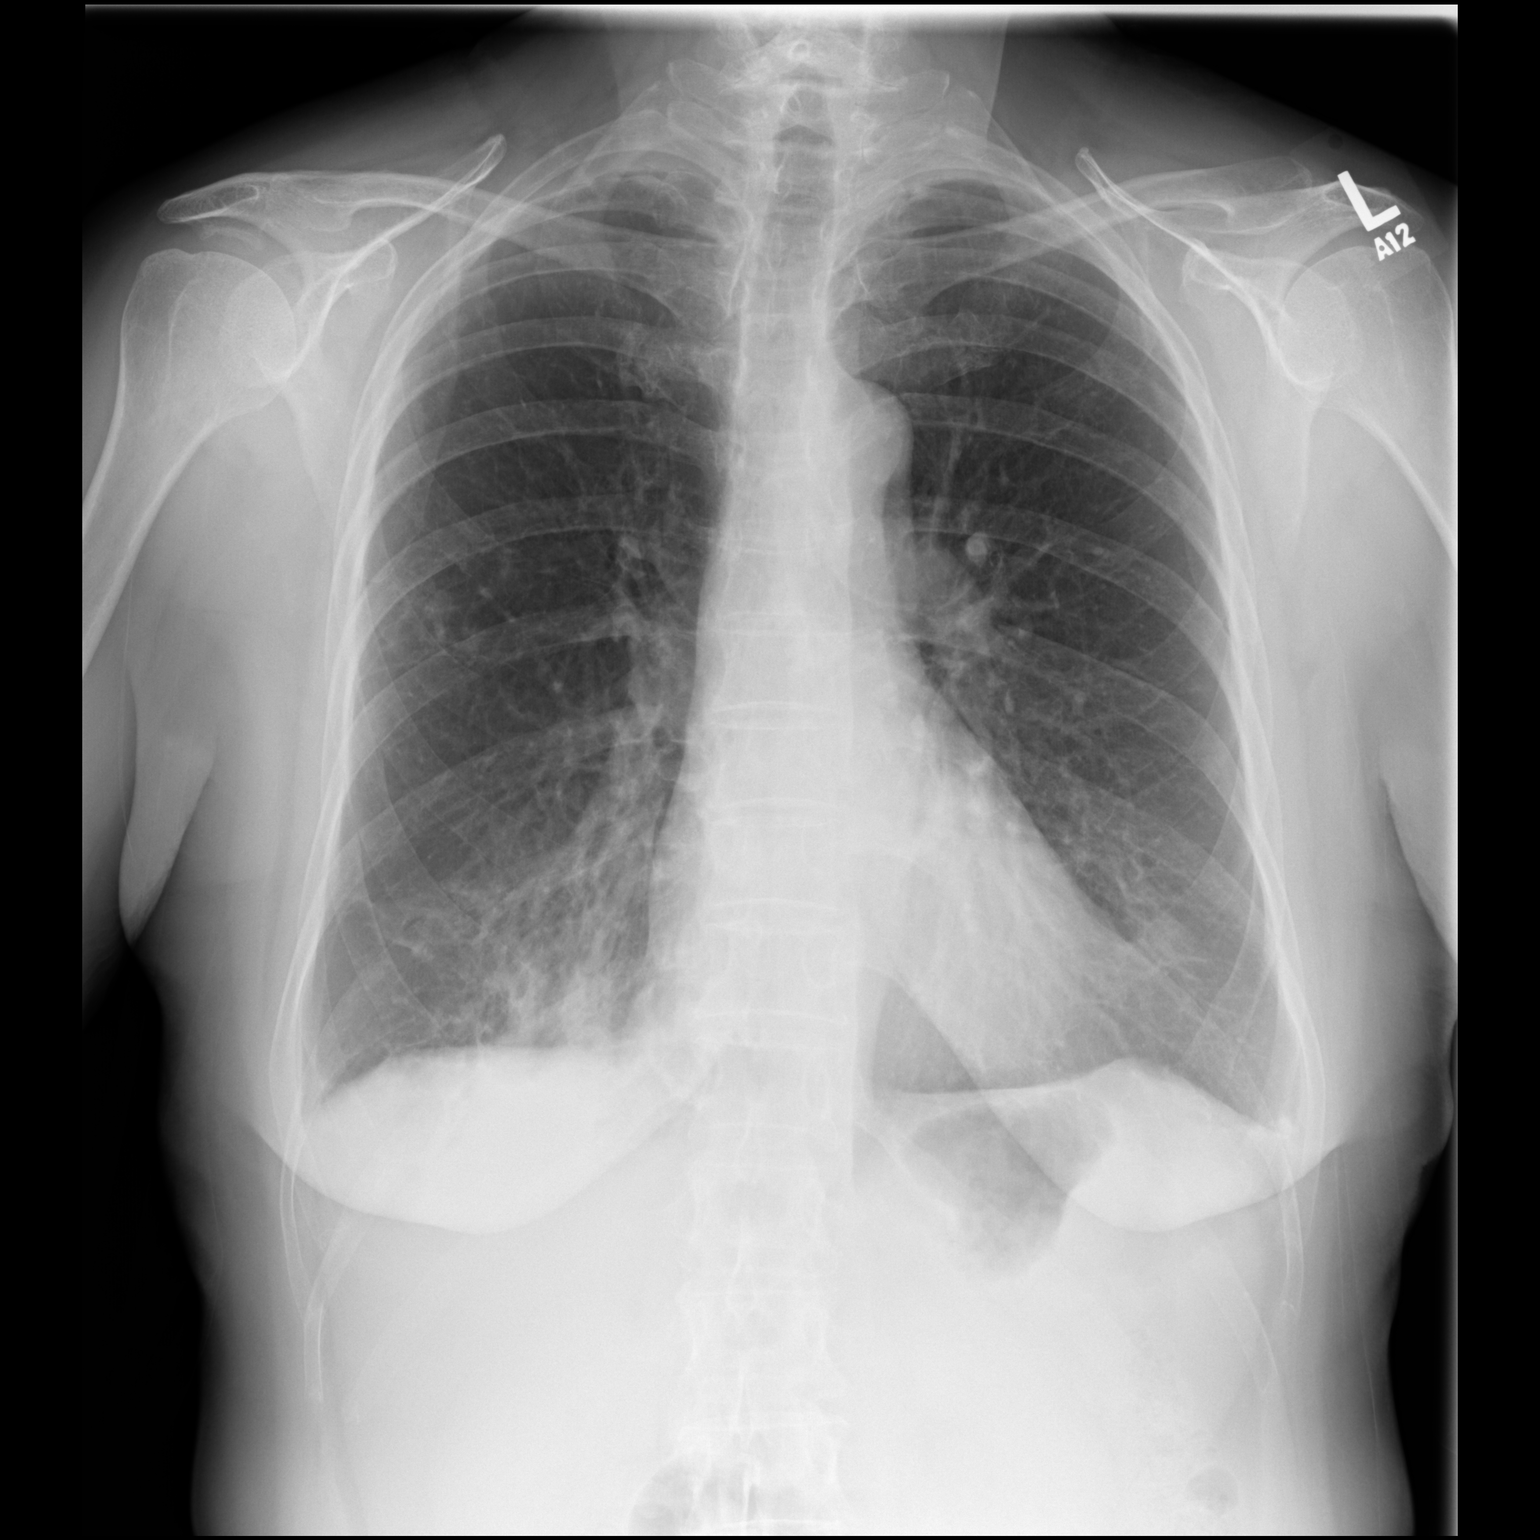

[dg chest 2 view (2 of 2)]
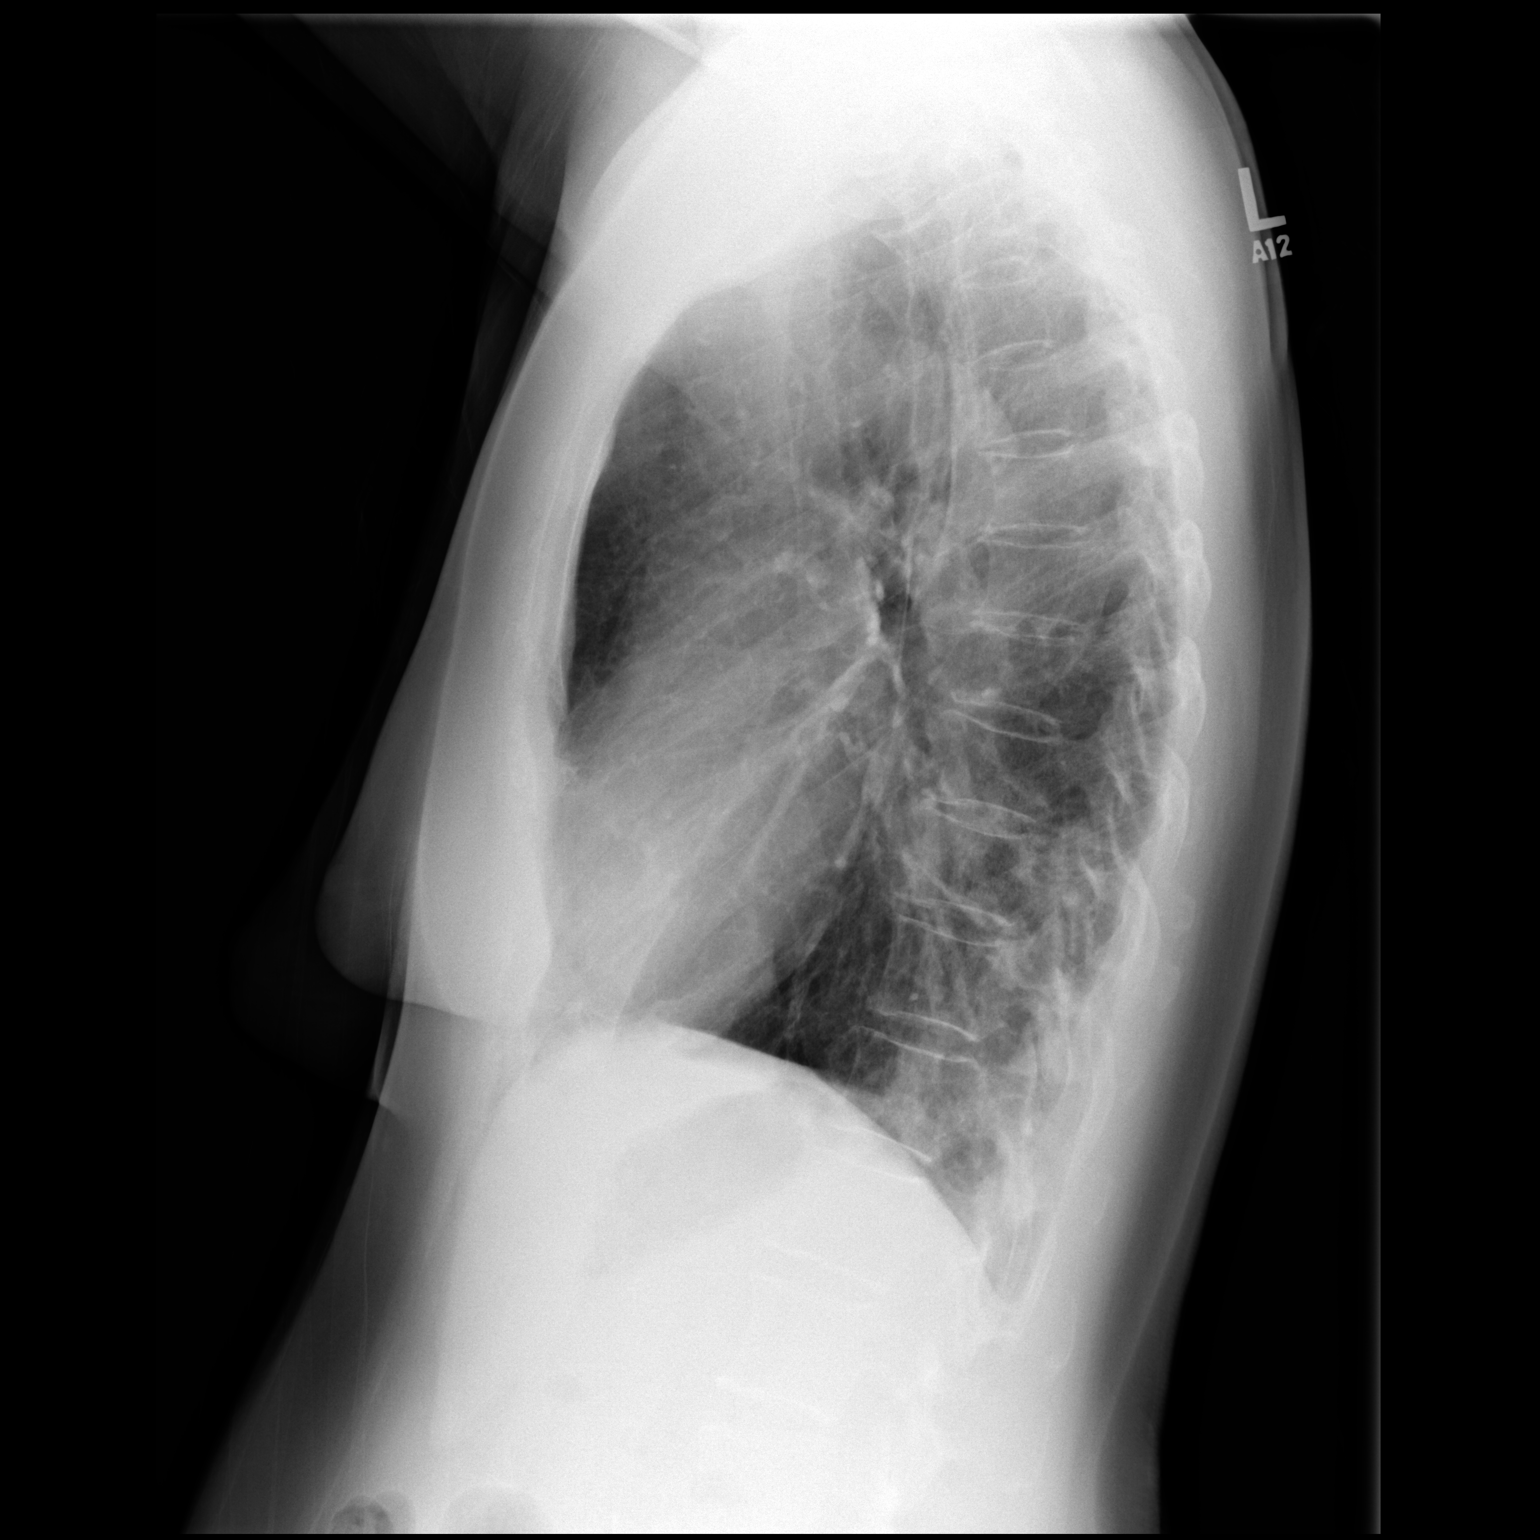

[2 of 2 positions shown; findings below may reference images not displayed]

FINDINGS: There is now more parenchymal opacity in the posterior right lower
lobe consistent with worsening of right lower lobe pneumonia.
Otherwise the lungs are clear. No pleural effusion is seen.
Mediastinal and hilar contours are stable and the heart is within
normal limits in size. No bony abnormality is seen.
IMPRESSION: Interval worsening of right lower lobe pneumonia.

## 2017-07-21 ENCOUNTER — Ambulatory Visit
Admission: RE | Admit: 2017-07-21 | Discharge: 2017-07-21 | Disposition: A | Discharge status: Home / Self Care | Payer: Medicare Other | Source: Ambulatory Visit | Attending: Family Medicine | Admitting: Family Medicine

## 2017-07-21 ENCOUNTER — Other Ambulatory Visit: Payer: Self-pay | Admitting: Family Medicine

## 2017-07-21 DIAGNOSIS — Z09 Encounter for follow-up examination after completed treatment for conditions other than malignant neoplasm: Secondary | ICD-10-CM

## 2017-07-21 IMAGING — CR DG CHEST 2V
2 series · 2 of 2 positions shown · non-contrast
Comparison: [DATE] and [DATE]

CLINICAL DATA: Follow-up right lower lobe pneumonia.

EXAM:
CHEST  2 VIEW

[w chest pa]
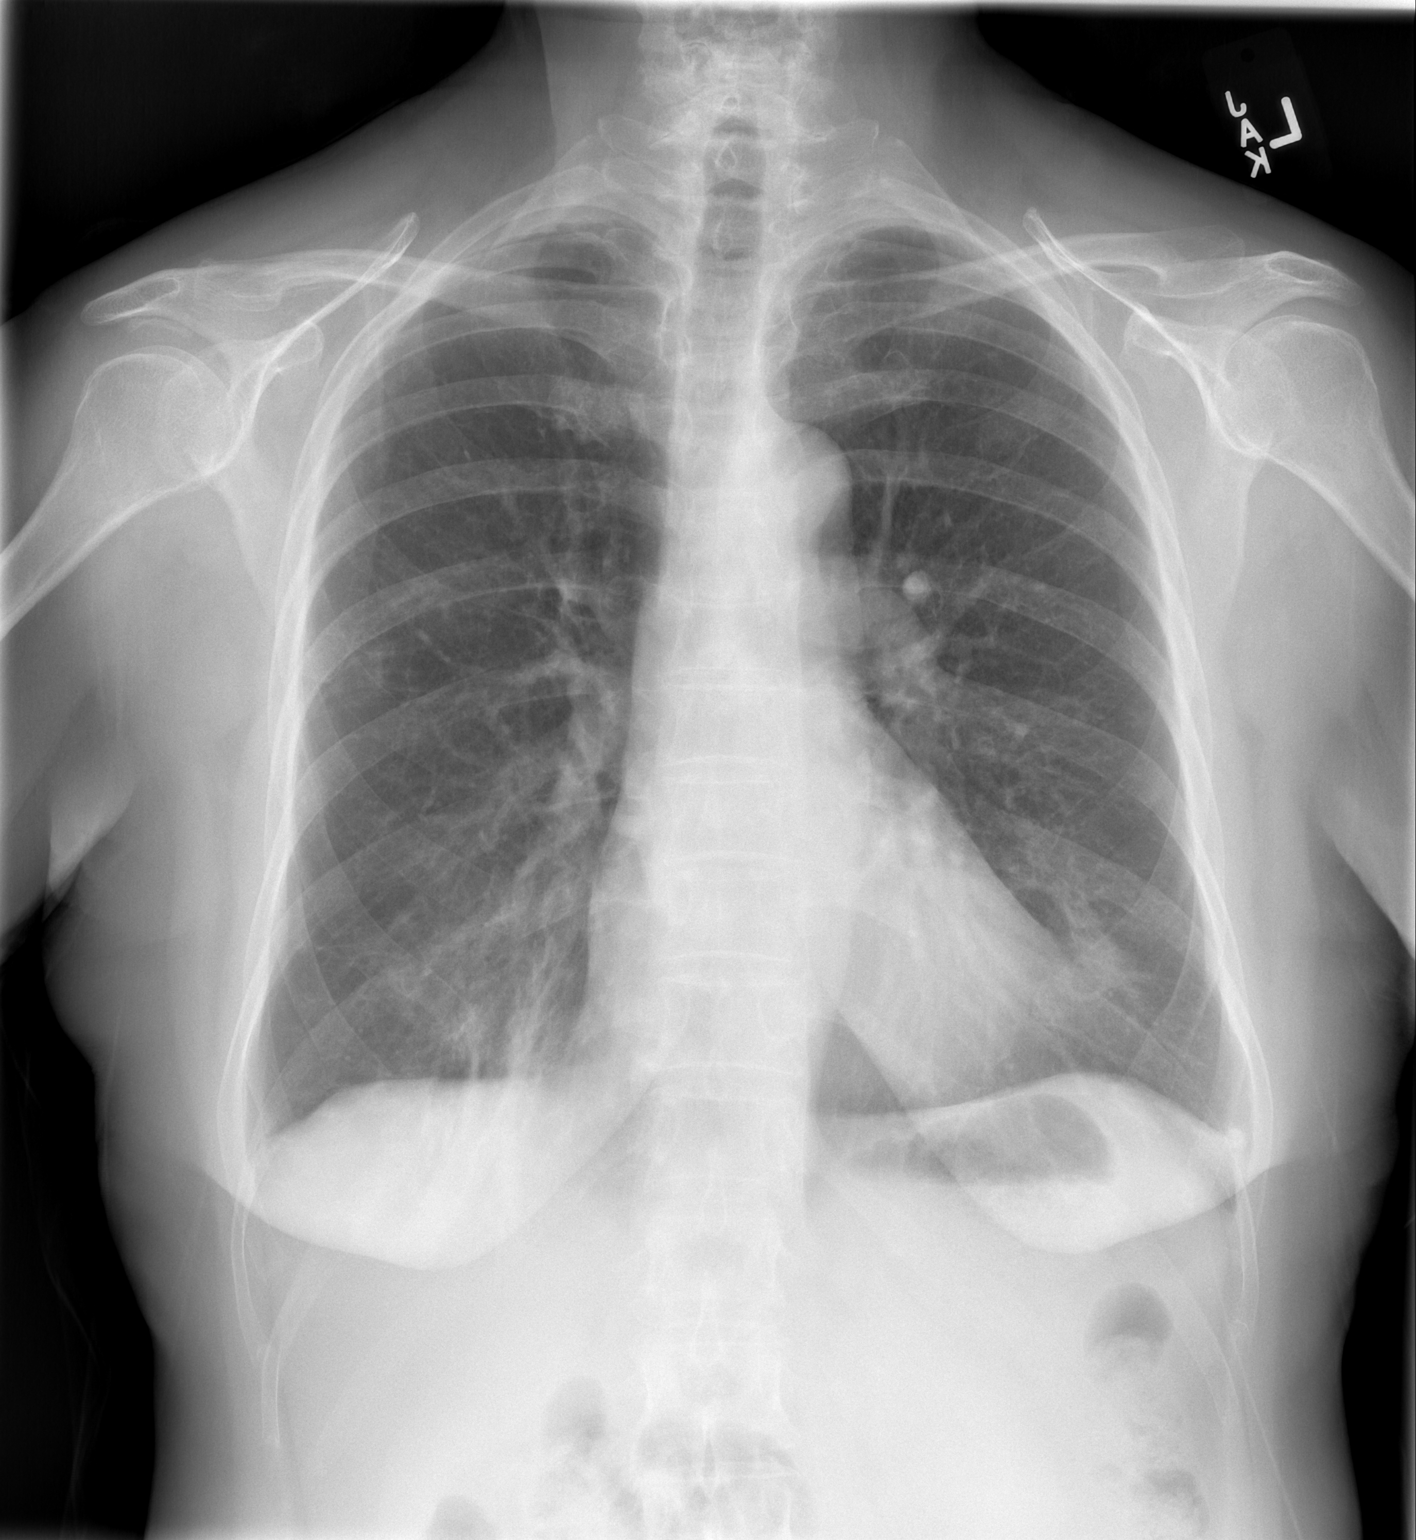

[w chest lat]
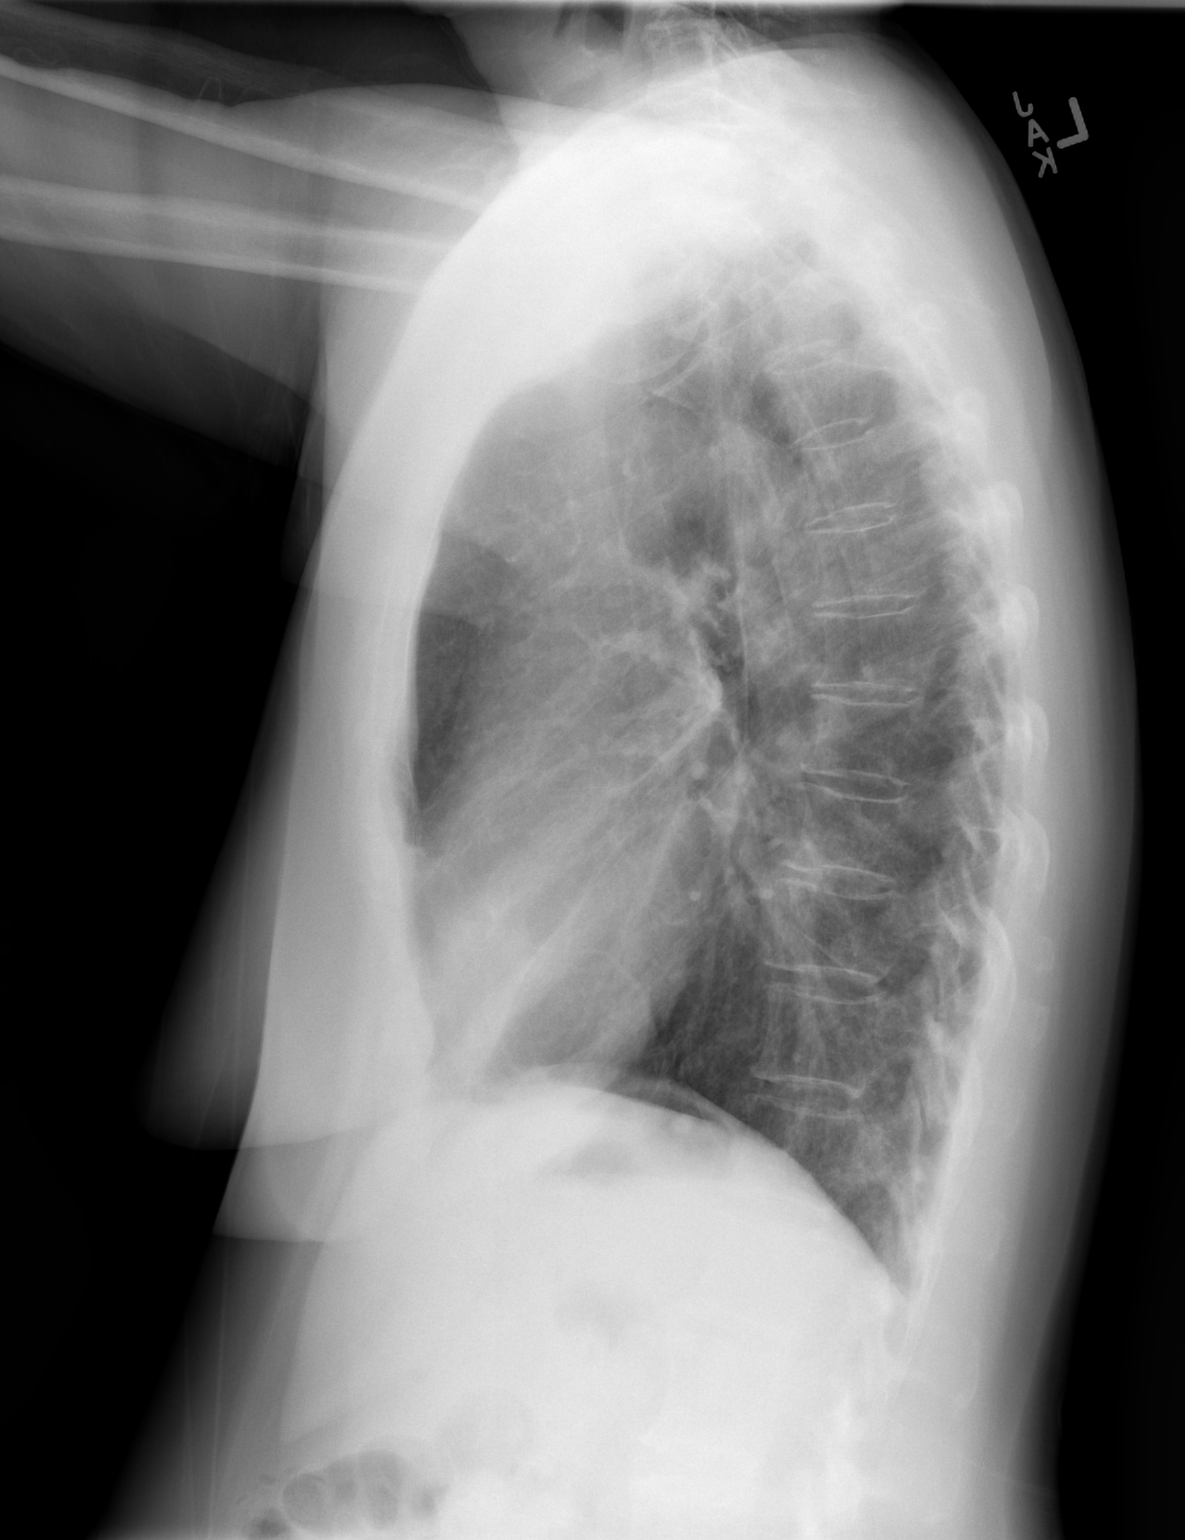

[2 of 2 positions shown; findings below may reference images not displayed]

FINDINGS: Lungs are adequately inflated with moderate interval improvement in
the posterior right lower lobe airspace process with only minimal
residual opacification. No effusion. Stable calcified granuloma over
the left costophrenic angle. Cardiomediastinal silhouette and
remainder the exam is unchanged.
IMPRESSION: Near resolution of previously noted right lower lobe airspace
process.

## 2017-10-12 ENCOUNTER — Other Ambulatory Visit: Payer: Self-pay | Admitting: Family Medicine

## 2017-10-12 ENCOUNTER — Ambulatory Visit
Admission: RE | Admit: 2017-10-12 | Discharge: 2017-10-12 | Disposition: A | Discharge status: Home / Self Care | Payer: Medicare Other | Source: Ambulatory Visit | Attending: Family Medicine | Admitting: Family Medicine

## 2017-10-12 DIAGNOSIS — R053 Chronic cough: Secondary | ICD-10-CM

## 2017-10-12 DIAGNOSIS — Z8701 Personal history of pneumonia (recurrent): Secondary | ICD-10-CM

## 2017-10-12 DIAGNOSIS — R05 Cough: Secondary | ICD-10-CM

## 2017-10-12 IMAGING — CR DG CHEST 2V
2 series · 2 of 2 positions shown · non-contrast
Comparison: [DATE] and [DATE]

CLINICAL DATA: Cough and history of pneumonia

EXAM:
CHEST - 2 VIEW

[w chest pa]
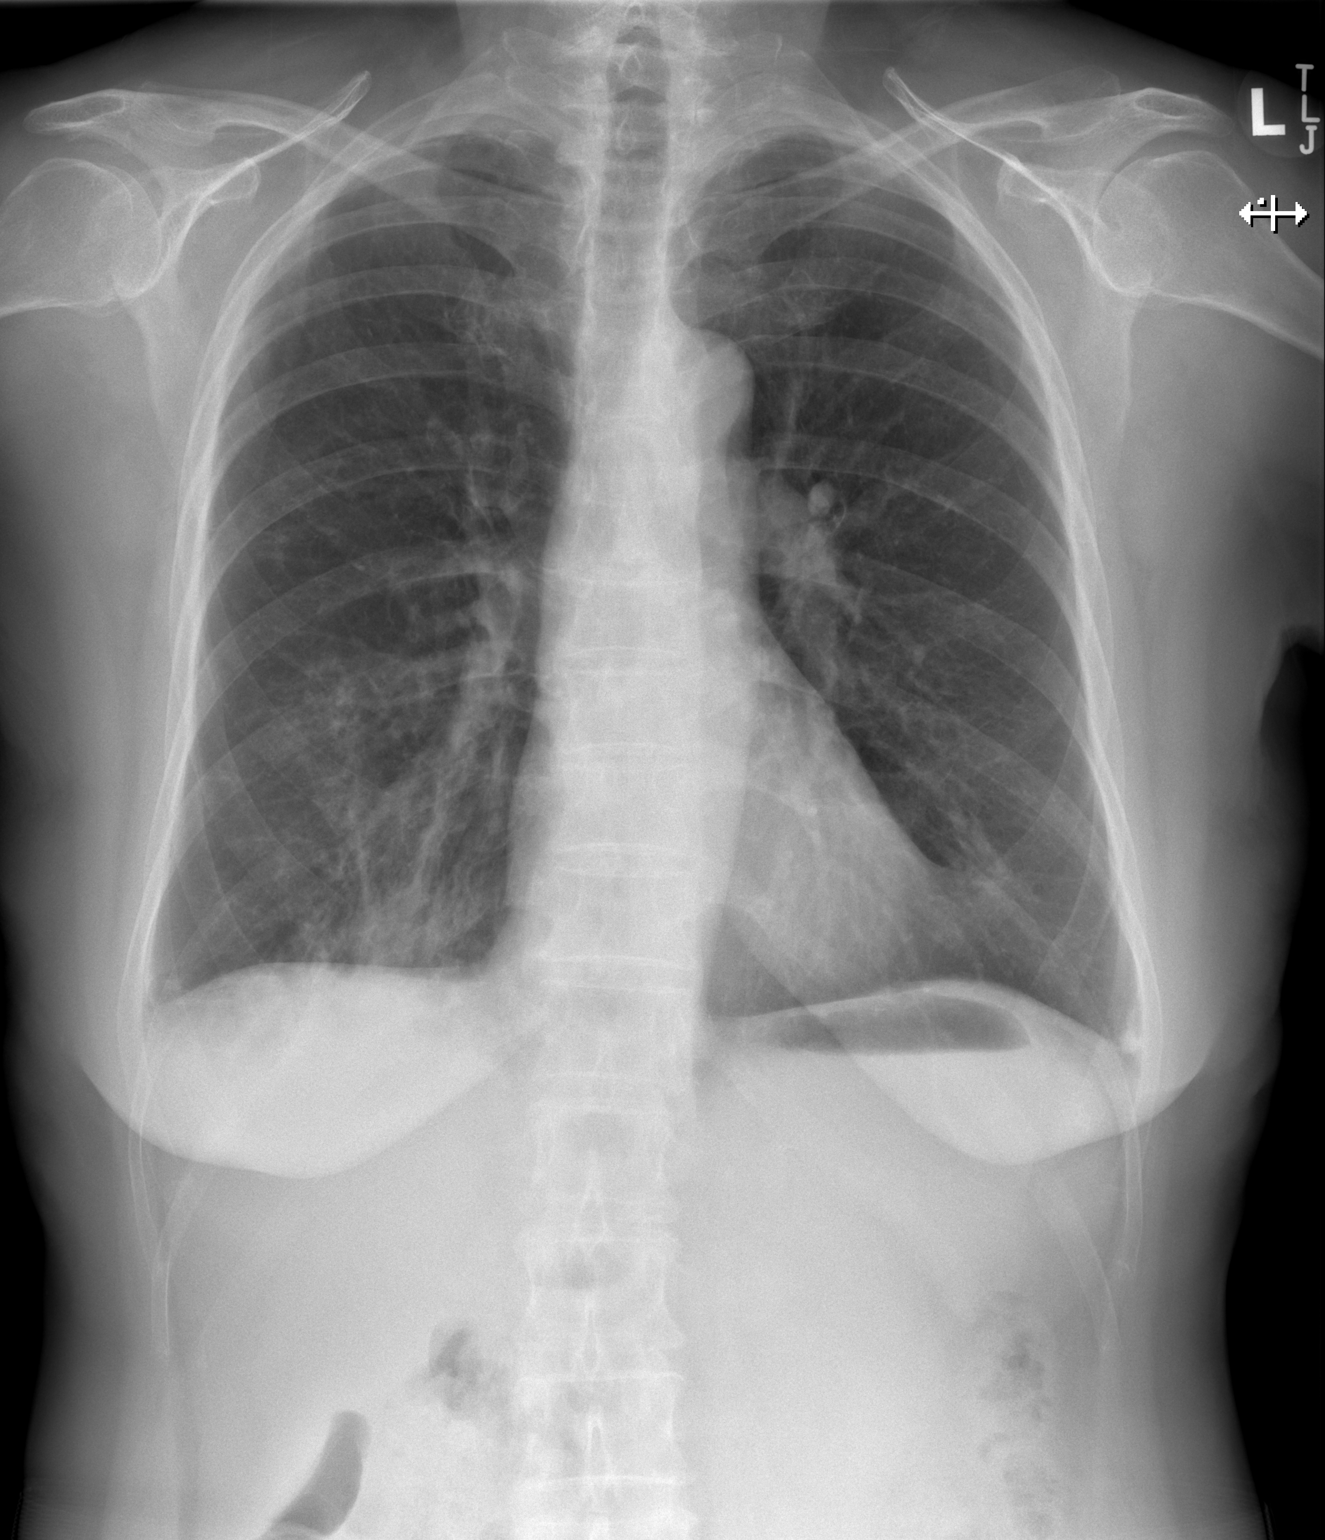

[w chest lat]
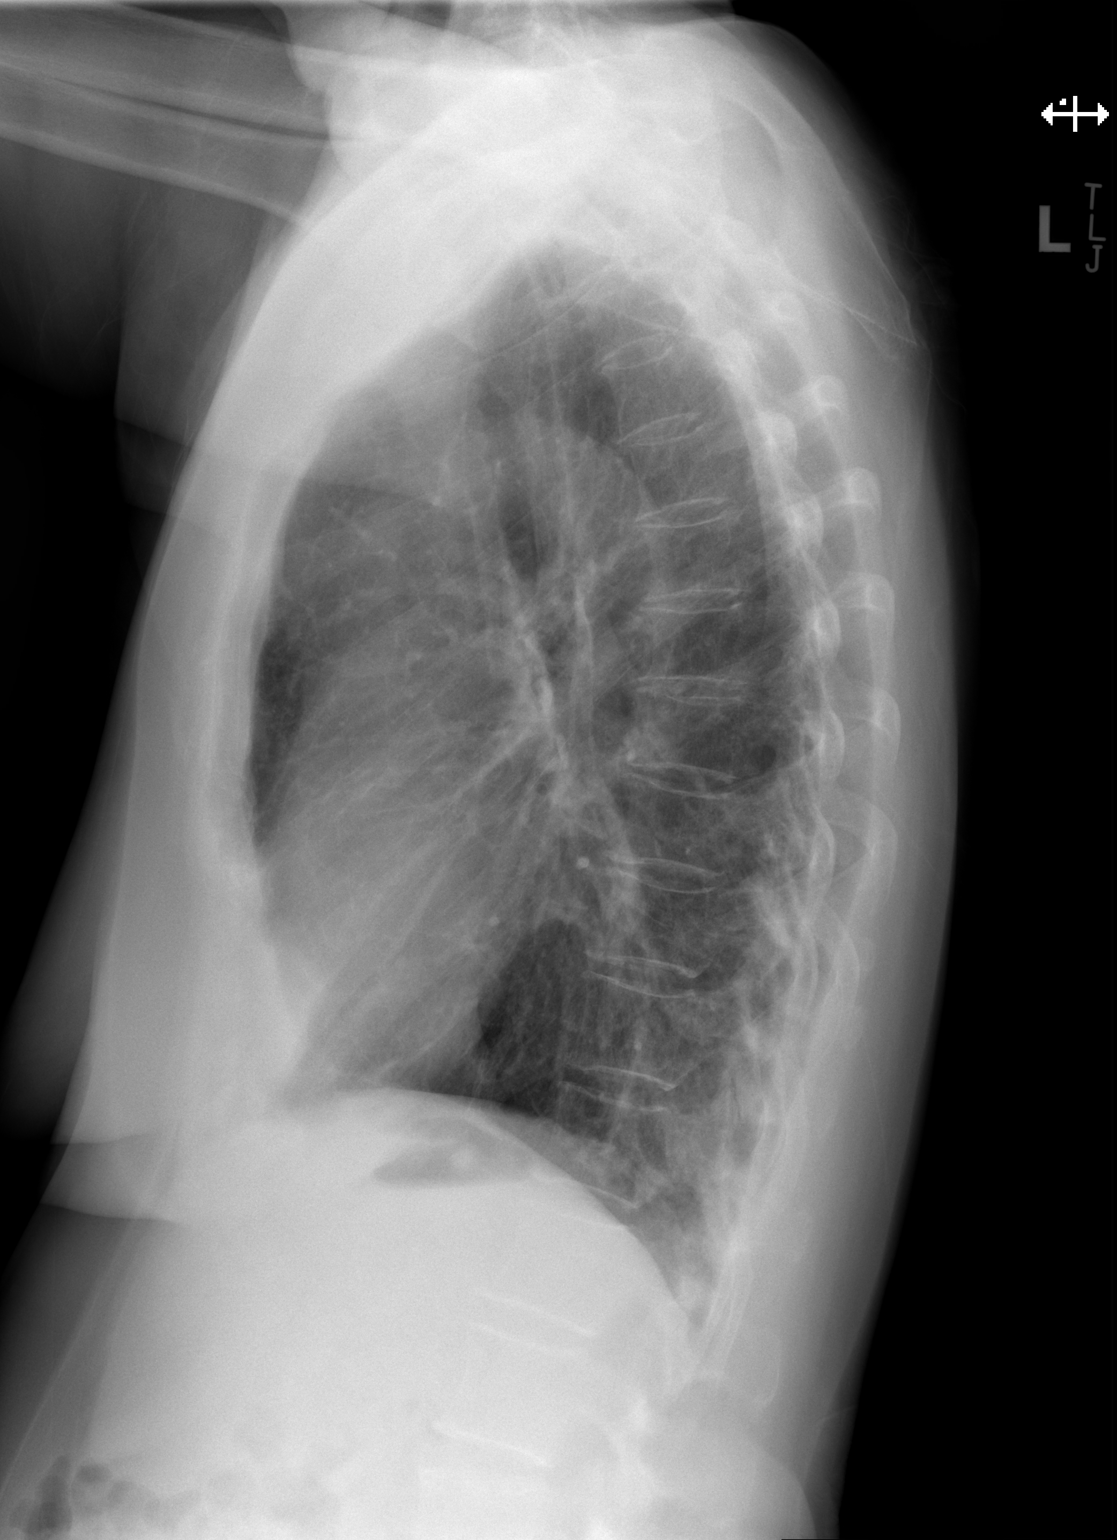

[2 of 2 positions shown; findings below may reference images not displayed]

FINDINGS: The lungs are well inflated. The cardiomediastinal contours are
normal.

Right lower lobe airspace opacities are similar to the prior study,
but less linear and slightly more extensive. No new area of
consolidation. There is no pleural effusion or pneumothorax.
IMPRESSION: Slight worsening of right lower lobe opacities compared to the prior
study. This could indicate recurrent pneumonia.

## 2017-12-02 ENCOUNTER — Other Ambulatory Visit: Payer: Self-pay | Admitting: *Deleted

## 2017-12-02 ENCOUNTER — Other Ambulatory Visit: Payer: Self-pay | Admitting: Family Medicine

## 2017-12-02 DIAGNOSIS — Z1231 Encounter for screening mammogram for malignant neoplasm of breast: Secondary | ICD-10-CM

## 2017-12-05 ENCOUNTER — Other Ambulatory Visit (INDEPENDENT_AMBULATORY_CARE_PROVIDER_SITE_OTHER): Payer: Medicare Other

## 2017-12-05 ENCOUNTER — Ambulatory Visit: Payer: Medicare Other | Admitting: Internal Medicine

## 2017-12-05 ENCOUNTER — Encounter: Payer: Self-pay | Admitting: Internal Medicine

## 2017-12-05 VITALS — BP 162/84 | HR 90 | Ht 64.0 in | Wt 139.0 lb

## 2017-12-05 DIAGNOSIS — R059 Cough, unspecified: Secondary | ICD-10-CM

## 2017-12-05 DIAGNOSIS — J479 Bronchiectasis, uncomplicated: Secondary | ICD-10-CM | POA: Insufficient documentation

## 2017-12-05 DIAGNOSIS — R053 Chronic cough: Secondary | ICD-10-CM

## 2017-12-05 DIAGNOSIS — R05 Cough: Secondary | ICD-10-CM

## 2017-12-05 LAB — CBC WITH DIFFERENTIAL/PLATELET
BASOS ABS: 0 10*3/uL (ref 0.0–0.1)
Basophils Relative: 0.6 % (ref 0.0–3.0)
Eosinophils Absolute: 0.2 10*3/uL (ref 0.0–0.7)
Eosinophils Relative: 2.5 % (ref 0.0–5.0)
HEMATOCRIT: 39.4 % (ref 36.0–46.0)
HEMOGLOBIN: 14.1 g/dL (ref 12.0–15.0)
LYMPHS PCT: 22.9 % (ref 12.0–46.0)
Lymphs Abs: 1.6 10*3/uL (ref 0.7–4.0)
MCHC: 35.8 g/dL (ref 30.0–36.0)
MCV: 89.6 fl (ref 78.0–100.0)
MONOS PCT: 15.7 % — AB (ref 3.0–12.0)
Monocytes Absolute: 1.1 10*3/uL — ABNORMAL HIGH (ref 0.1–1.0)
Neutro Abs: 4 10*3/uL (ref 1.4–7.7)
Neutrophils Relative %: 58.3 % (ref 43.0–77.0)
Platelets: 395 10*3/uL (ref 150.0–400.0)
RBC: 4.4 Mil/uL (ref 3.87–5.11)
RDW: 13.9 % (ref 11.5–15.5)
WBC: 6.8 10*3/uL (ref 4.0–10.5)

## 2017-12-05 NOTE — Progress Notes (Signed)
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 no trouble at all but in the 1990s  developed rhinitis  On shots from [place] and seemed to help p many years and did fine s needing any meds but developed cough around 2017 > [name] eval pos allergy to dust / ragweed and felt it was reflux rx but never took the meds then Jan 2018 cough got worse and dx with RLL air space dx better with abx but never gone and then again cough flared worse Jan 2019 so referred to pulmonary clinic 12/05/2017 by Dr Shaune Pollackonna [name]      12/05/2017  Initial office eval/ Connie Lindsey on Prilosec 20 mg daily 30 min ac since Dec 2018  Chief Complaint  Patient presents with  . Pulmonary Consult    Referred by Dr. Kevan Lindsey. Pt c/o cough off and on since she had PNA in Jan 2018- worse since Dec 2019.  Her cough is occ prod with clear to yellow sputum.    each time dx as pneumonia  mucus did change to darker but never cleared completely neither clinically nor radiographically  Dyspnea:  Not limited by breathing from desired activities   Cough: worse when leaning back to read at hs  Sleep: ok at 30 degrees / some fare in am of cough   No obvious day to day or daytime variability or assoc   mucus plugs or hemoptysis or cp or chest tightness, subjective wheeze or overt sinus or hb symptoms.    Also denies any obvious fluctuation of symptoms with weather or environmental changes or other aggravating or alleviating factors except as outlined above   No unusual exposure hx or h/o childhood pna/ asthma or knowledge of premature birth.  Current Allergies, Complete Past Medical History, Past Surgical History, Family History, and Social History were reviewed in [facility] electronic medical record.  ROS  The following are not active complaints unless bolded Hoarseness, sore throat, dysphagia, dental problems, itching, sneezing,  nasal congestion or discharge of  excess mucus or purulent secretions, ear ache,   fever, chills, sweats, unintended wt loss or wt gain, classically pleuritic or exertional cp,  orthopnea pnd or arm/hand swelling  or leg swelling, presyncope, palpitations, abdominal pain, anorexia, nausea, vomiting, diarrhea  or change in bowel habits or change in bladder habits, change in stools or change in urine, dysuria, hematuria,  rash, arthralgias, visual complaints, headache, numbness, weakness or ataxia or problems with walking or coordination,  change in mood or  memory.                Past Medical History:  Diagnosis Date  . Arthritis   . Family history of adverse reaction to anesthesia    father- had fall- hip surgery - memory loss then had pneumonia   . GERD (gastroesophageal reflux disease)   . Hypertension     Outpatient Medications Prior to Visit  Medication Sig Dispense Refill  . CALCIUM PO Take 900 mg by mouth 2 (two) times daily.    . clobetasol (TEMOVATE) 0.05 % external solution clobetasol 0.05 % scalp solution    . hydrocortisone valerate cream (WESTCORT) 0.2 % Apply 1 application topically as needed. Patient uses as needed  To face for rosacea    . metroNIDAZOLE (METROCREAM) 0.75 % cream Apply 1 application topically daily as needed (for rosacea).    . MULTIPLE  MINERALS PO Take 1 capsule by mouth daily.    . Omega-3 Fatty Acids (FISH OIL PO) Take 1 capsule by mouth daily.    Marland Kitchen omeprazole (PRILOSEC) 20 MG capsule Take 20 mg by mouth daily.    . potassium chloride (MICRO-K) 10 MEQ CR capsule Take 20 mEq by mouth 2 (two) times daily.     Marland Kitchen triamterene-hydrochlorothiazide (DYAZIDE) 37.5-25 MG per capsule Take 1 capsule by mouth every morning.    . TURMERIC PO Take 1 Can by mouth daily.    . methocarbamol (ROBAXIN) 500 MG tablet Take 1 tablet (500 mg total) by mouth every 6 (six) hours as needed for muscle spasms. 80 tablet 0  . oxyCODONE (OXY IR/ROXICODONE) 5 MG immediate release tablet Take 1-2 tablets (5-10 mg  total) by mouth every 3 (three) hours as needed for moderate pain, severe pain or breakthrough pain. 90 tablet 0  . rivaroxaban (XARELTO) 10 MG TABS tablet Take 1 tablet (10 mg total) by mouth daily with breakfast. Take Xarelto for two and a half more weeks, then discontinue Xarelto. Once the patient has completed the blood thinner regimen, then take a Baby 81 mg Aspirin daily for three more weeks. 20 tablet 0  . traMADol (ULTRAM) 50 MG tablet Take 1-2 tablets (50-100 mg total) by mouth every 6 (six) hours as needed (mild pain). 80 tablet 1  . valACYclovir (VALTREX) 500 MG tablet Take 500 mg by mouth daily. Patient takes at lunch     No facility-administered medications prior to visit.              Objective:     BP (!) 162/84 (BP Location: Left Arm, Cuff Size: Normal)   Pulse 90   Ht 5\' 4"  (1.626 m)   Wt 139 lb (63 kg)   SpO2 95%   BMI 23.86 kg/m   SpO2: 95 % RA   Pleasant amb wf with occ throat clearing / nad  HEENT: nl dentition, turbinates bilaterally, and oropharynx. Nl external ear canals without cough reflex   NECK :  without JVD/Nodes/TM/ nl carotid upstrokes bilaterally   LUNGS: no acc muscle use,  Nl contour chest  With a few insp/exp rhonchi R base posteriorly without cough on insp or exp maneuvers   CV:  RRR  no s3 or murmur or increase in P2, and no edema   ABD:  soft and nontender with nl inspiratory excursion in the supine position. No bruits or organomegaly appreciated, bowel sounds nl  MS:  Nl gait/ ext warm without deformities, calf tenderness, cyanosis or clubbing No obvious joint restrictions   SKIN: warm and dry without lesions    NEURO:  alert, approp, nl sensorium with  no motor or cerebellar deficits apparent.     I personally reviewed images and agree with radiology impression as follows:  CXR:    10/12/17 Slight worsening of right lower lobe opacities compared to the prior study. This could indicate recurrent pneumonia.     Assessment    No problem-specific Assessment & Plan notes found for this encounter.     Sandrea Hughs, MD 12/05/2017

## 2017-12-05 NOTE — Patient Instructions (Signed)
Most likely diagnosis is Bronchiectasis = Bronchiectasis =   you have scarring of your bronchial tubes which means that they don't function perfectly normally and mucus tends to pool in certain areas of your lung which can cause pneumonia and further scarring of your lung and bronchial tubes   Continue Priloscec 20 mg Take 30- 60 min before your first and last meals of the day    GERD (REFLUX)  is an extremely common cause of respiratory symptoms just like yours , many times with no obvious heartburn at all.    It can be treated with medication, but also with lifestyle changes including elevation of the head of your bed (ideally with 6 inch  bed blocks),  Smoking cessation, avoidance of late meals, excessive alcohol, and avoid fatty foods, chocolate, peppermint, colas, red wine, and acidic juices such as orange juice.  NO MINT OR MENTHOL PRODUCTS SO NO COUGH DROPS   USE SUGARLESS CANDY INSTEAD (Jolley ranchers or Stover's or Life Savers) or even ice chips will also do - the key is to swallow to prevent all throat clearing. NO OIL BASED VITAMINS - use powdered substitutes.   Please see patient coordinator before you leave today  to schedule a CT sinus and HRCT Chest CT    Please remember to go to the lab department downstairs in the basement  for your tests - we will call you with the results when they are available.      Please schedule a follow up office visit in 6 weeks, call sooner if needed

## 2017-12-05 NOTE — Assessment & Plan Note (Signed)
Trial of ppi bid 12/05/2017  - Allergy profile 12/05/2017 >  Eos 0. /  IgE  pending - HRCT chest ordered   The most common causes of chronic cough in immunocompetent adults include the following: upper airway cough syndrome (UACS), previously referred to as postnasal drip syndrome (PNDS), which is caused by variety of rhinosinus conditions; (2) asthma; (3) GERD; (4) chronic bronchitis from cigarette smoking or other inhaled environmental irritants; (5) nonasthmatic eosinophilic bronchitis; and (6) bronchiectasis which fits the best radiographically and clinically and may be assoc with occult sinusitis so rec CT sinus and HRCT to start the w/u along with alpha one and screen for ABPA with IgE/ allergyprofile    These conditions, singly or in combination, have accounted for up to 94% of the causes of chronic cough in prospective studies.   Other conditions have constituted no >6% of the causes in prospective studies These have included bronchogenic carcinoma, chronic interstitial pneumonia, sarcoidosis, left ventricular failure, ACEI-induced cough, and aspiration from a condition associated with pharyngeal dysfunction.    Chronic cough is often simultaneously caused by more than one condition. A single cause has been found from 38 to 82% of the time, multiple causes from 18 to 62%. Multiply caused cough has been the result of three diseases up to 42% of the time.    For now rx with max rx for gerd/ diet and f/u in 4-6 weeks   Total time devoted to counseling  > 50 % of initial 60 min office visit:  review case with pt/husban discussion of options/alternatives/ personally creating written customized instructions  in presence of pt  then going over those specific  Instructions directly with the pt including how to use all of the meds but in particular covering each new medication in detail and the difference between the maintenance= "automatic" meds and the prns using an action plan format for the latter  (If this problem/symptom => do that organization reading Left to right).  Please see AVS from this visit for a full list of these instructions which I personally wrote for this pt and  are unique to this visit.

## 2017-12-07 NOTE — Progress Notes (Signed)
Spoke with pt and notified of results per Dr. Wert. Pt verbalized understanding and denied any questions. 

## 2017-12-08 LAB — RESPIRATORY ALLERGY PROFILE REGION II ~~LOC~~
Allergen, Cedar tree, t12: <0.1 kU/L
Allergen, Cottonwood, t14: <0.1 kU/L
Allergen, D pternoyssinus,d7: 0.74 kU/L — ABNORMAL HIGH
Allergen, Mulberry, t76: <0.1 kU/L
Allergen, Oak,t7: <0.1 kU/L
Allergen, P. notatum, m1: <0.1 kU/L
Aspergillus fumigatus, m3: <0.1 kU/L
CLASS: 0
CLASS: 0
CLASS: 0
CLASS: 0
CLASS: 0
COMMON RAGWEED (SHORT) (W1) IGE: 0.12 kU/L — AB
Cat Dander: <0.1 kU/L
Class: 0
Class: 0
Class: 0
Class: 0
Class: 0
Class: 0
Class: 0
Class: 0
Class: 0
Class: 0
Class: 0
Class: 0
Class: 0
Class: 0
Class: 0
Class: 0
Class: 0
Class: 1
Class: 2
Cockroach: <0.1 kU/L
D. farinae: 0.61 kU/L — ABNORMAL HIGH
Dog Dander: <0.1 kU/L
Elm IgE: <0.1 kU/L
IGE (IMMUNOGLOBULIN E), SERUM: 695 kU/L — AB (ref <=114)
Johnson Grass: <0.1 kU/L
Rough Pigweed  IgE: <0.1 kU/L

## 2017-12-08 LAB — ALPHA-1 ANTITRYPSIN PHENOTYPE: A1 ANTITRYPSIN SER: 165 mg/dL (ref 83–199)

## 2017-12-08 LAB — INTERPRETATION:

## 2017-12-13 ENCOUNTER — Ambulatory Visit (INDEPENDENT_AMBULATORY_CARE_PROVIDER_SITE_OTHER)
Admission: RE | Admit: 2017-12-13 | Discharge: 2017-12-13 | Disposition: A | Discharge status: Home / Self Care | Payer: Medicare Other | Source: Ambulatory Visit | Attending: Internal Medicine | Admitting: Internal Medicine

## 2017-12-13 DIAGNOSIS — R053 Chronic cough: Secondary | ICD-10-CM

## 2017-12-13 DIAGNOSIS — R059 Cough, unspecified: Secondary | ICD-10-CM

## 2017-12-13 DIAGNOSIS — R05 Cough: Secondary | ICD-10-CM | POA: Diagnosis not present

## 2017-12-13 IMAGING — CT CT CHEST HIGH RESOLUTION W/O CM
2 of 5 series · 15 of 36 positions shown, 18 images · non-contrast
Comparison: [DATE] chest radiograph.

CLINICAL DATA: Chronic cough and dyspnea. Waxing and waning right
lower lobe lung opacities on chest radiographs. Remote smoking
history.

EXAM:
CT CHEST WITHOUT CONTRAST
TECHNIQUE: Multidetector CT imaging of the chest was performed following the
standard protocol without intravenous contrast. High resolution
imaging of the lungs, as well as inspiratory and expiratory imaging,
was performed.

[Series 4: high resolution · axial · 0.66mm/px · z∈[+1344,+1620]mm · 12 of 152 slices shown, 15 images]
[im 7/152  mediastinal]
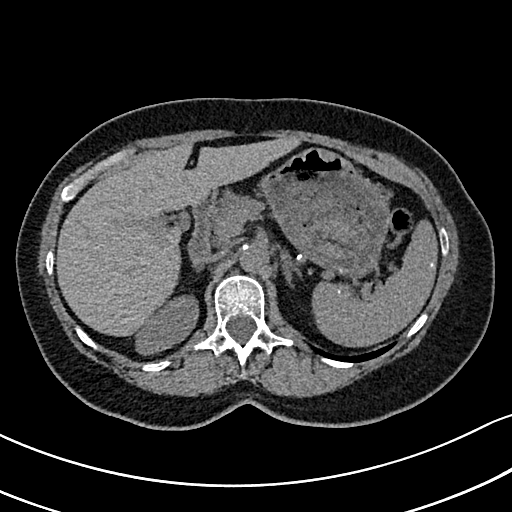
[im 7/152  lung]
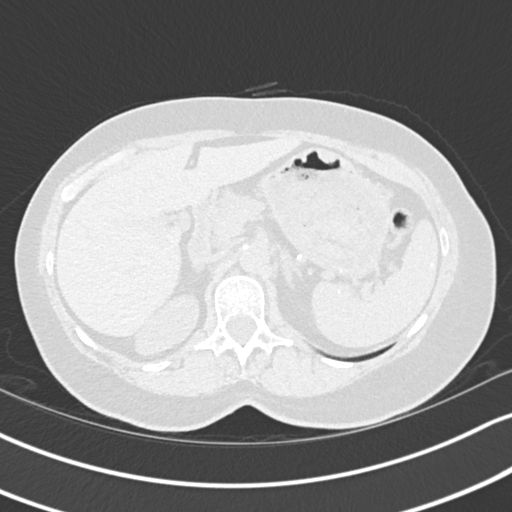
[im 20/152  lung]
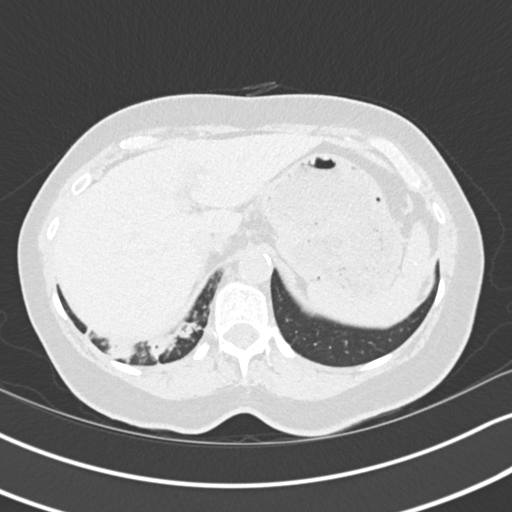
[im 33/152  lung]
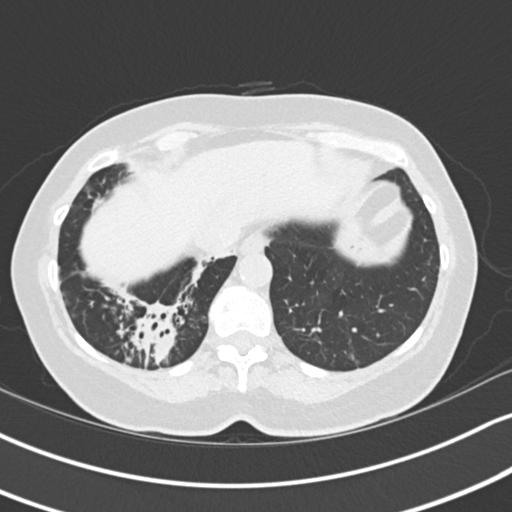
[im 46/152  lung]
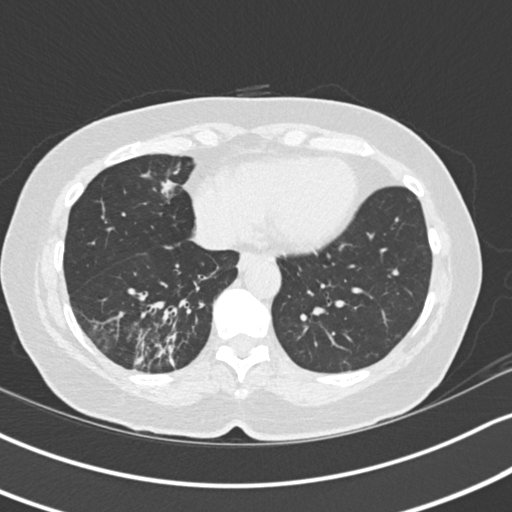
[im 60/152  mediastinal]
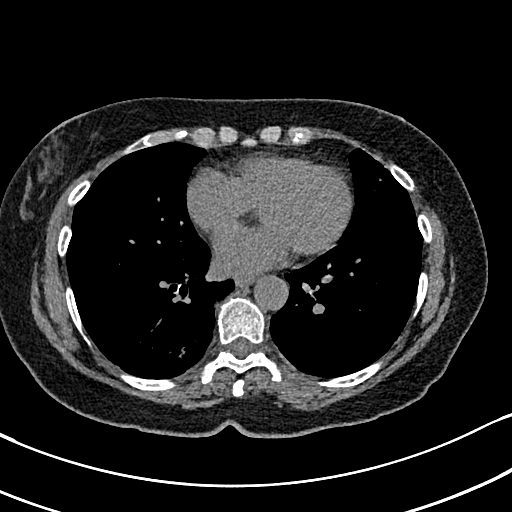
[im 60/152  lung]
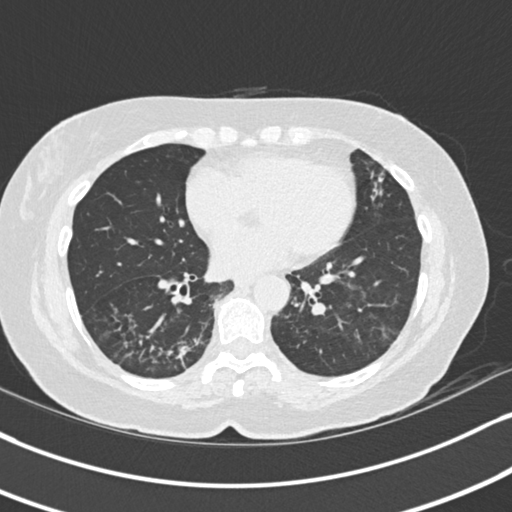
[im 73/152  lung]
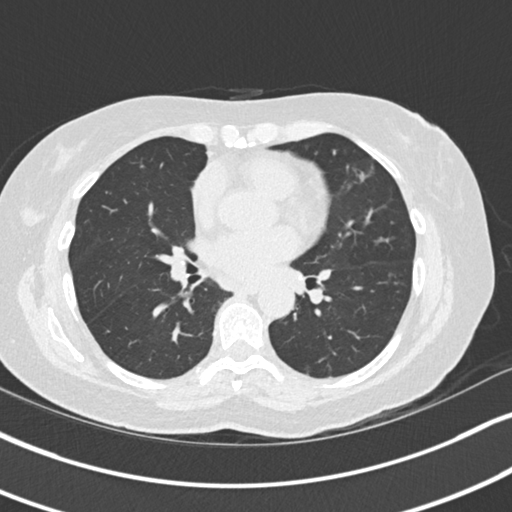
[im 79/152  lung]
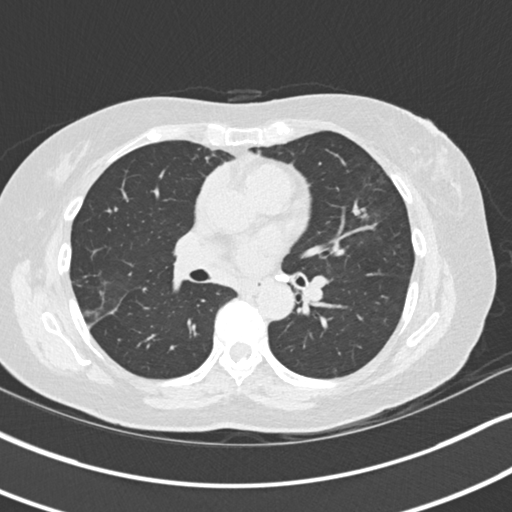
[im 92/152  lung]
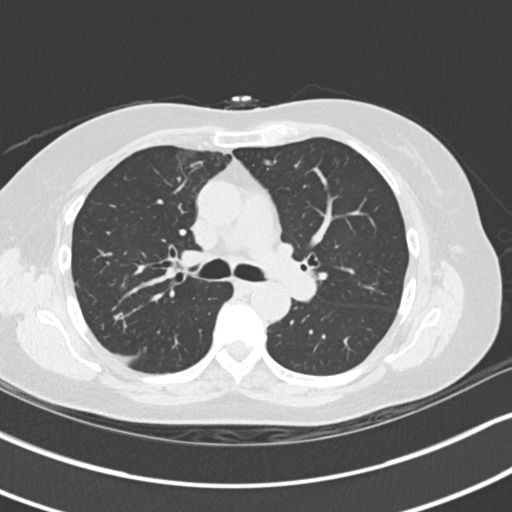
[im 106/152  mediastinal]
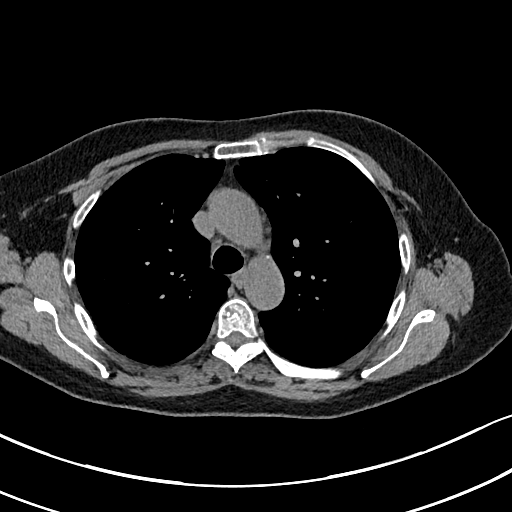
[im 106/152  lung]
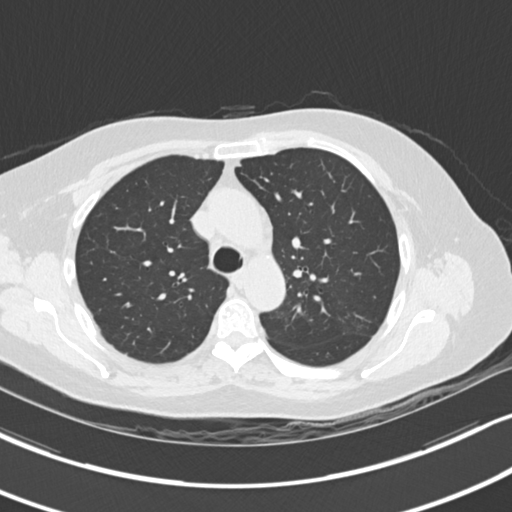
[im 119/152  lung]
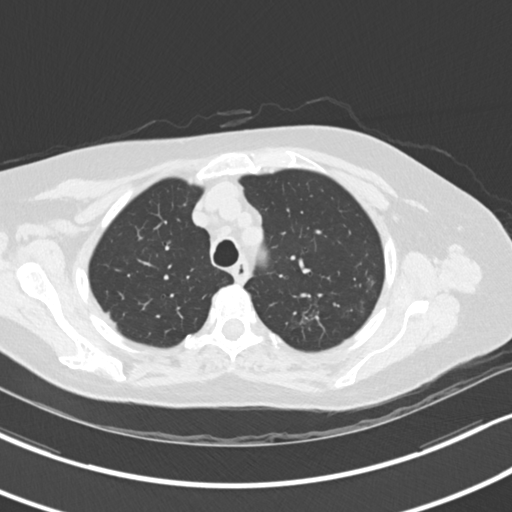
[im 132/152  lung]
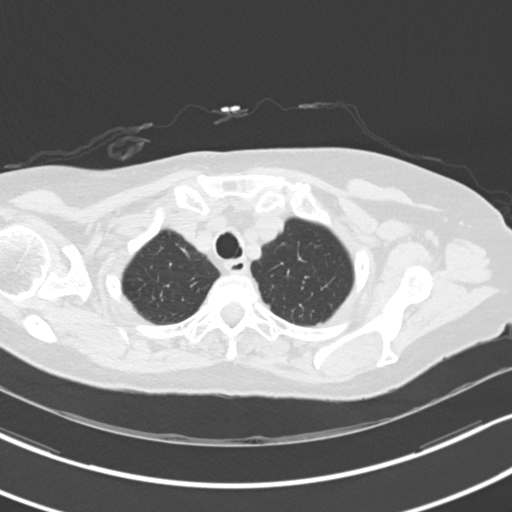
[im 145/152  lung]
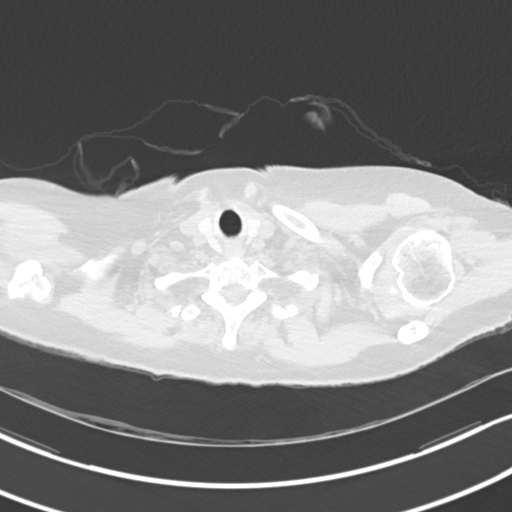

[Series 7: coronal · coronal · 0.59mm/px · 3 of 125 slices shown]
[im 25/125  lung]
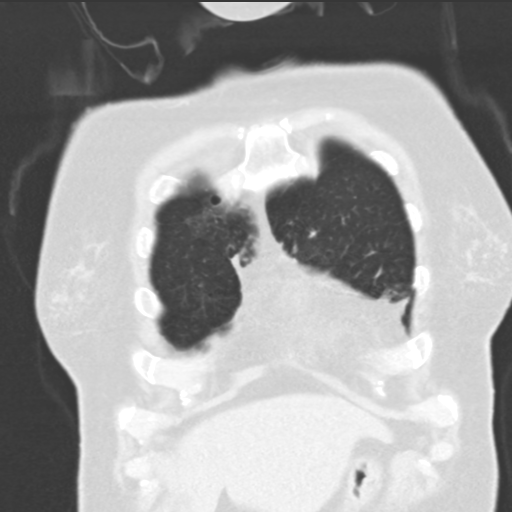
[im 50/125  lung]
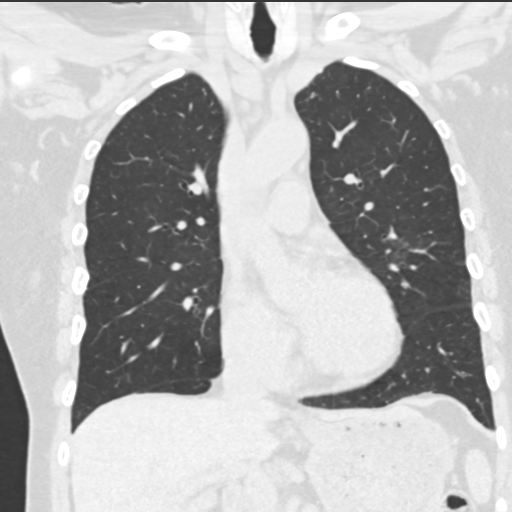
[im 75/125  lung]
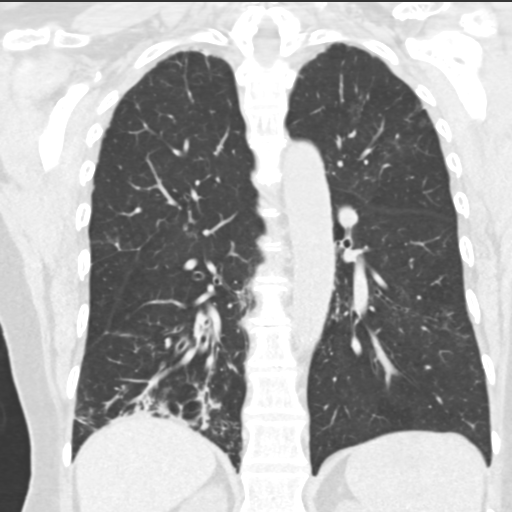

[15 of 36 positions shown; findings below may reference images not displayed]

FINDINGS: Cardiovascular: Normal heart size. No significant pericardial
effusion/thickening. Mildly atherosclerotic nonaneurysmal thoracic
aorta. Normal caliber pulmonary arteries.

Mediastinum/Nodes: No discrete thyroid nodules. Unremarkable
esophagus. No pathologically enlarged axillary, mediastinal or hilar
lymph nodes, noting limited sensitivity for the detection of hilar
adenopathy on this noncontrast study. Coarsely calcified nonenlarged
subcarinal and left hilar nodes from prior granulomatous disease.

Lungs/Pleura: No pneumothorax. No pleural effusion. Coarsely
calcified 8 mm peripheral left lower lobe granuloma. There is
moderate cylindrical and varicoid bronchiectasis scattered
throughout both lungs involving all lung lobes, most prominent in
medial right upper lobe, medial right middle lobe, lingula and right
lower lobe. There is associated diffuse bronchial wall thickening.
There are patchy tree-in-bud opacities throughout the areas of
bronchiectasis, most prominent in the right lower lobe. There is a
thick irregular bandlike focus of consolidation in the
peribronchovascular basilar right lower lobe measuring 3.4 x 3.3 cm
(series 5/image 126). There are small mildly thickened parenchymal
bands in the right middle lobe medially and in the inferior segment
lingula, compatible with postinfectious scarring. Mild patchy air
trapping in both lungs on the expiration sequence. No frank
honeycombing.

Upper abdomen: Scattered punctate calcified liver and spleen
granulomas.

Musculoskeletal: No aggressive appearing focal osseous lesions. Mild
thoracic spondylosis.
IMPRESSION: 1. Moderate cylindrical and varicoid bronchiectasis scattered
throughout both lungs involving all lung lobes, with associated
patchy tree-in-bud opacities. Findings could be due to atypical
mycobacterial infection (ILEANA) or recurrent aspiration.
2. Thick irregular bandlike focus of peribronchovascular
consolidation in the basilar right lower lobe, favor evolving
postinfectious/postinflammatory scarring. Recommend attention on
follow-up chest CT in 3 months.
3. Mild patchy air trapping in both lungs, indicative of small
airways disease.

Aortic Atherosclerosis ([OY]-[OY]).

## 2017-12-13 IMAGING — CT CT PARANASAL SINUSES LIMITED
1 of 2 series · 9 of 12 positions shown, 12 images · non-contrast
Comparison: None.

CLINICAL DATA: Chronic cough.

EXAM:
CT PARANASAL SINUS LIMITED WITHOUT CONTRAST
TECHNIQUE: Non-contiguous multidetector CT images of the paranasal sinuses were
obtained in a single plane without contrast.

[Series 4: limited sinus st · axial · 0.35mm/px · z∈[+2,+82]mm · 9 of 11 slices shown, 12 images]
[im 2/11  brain]
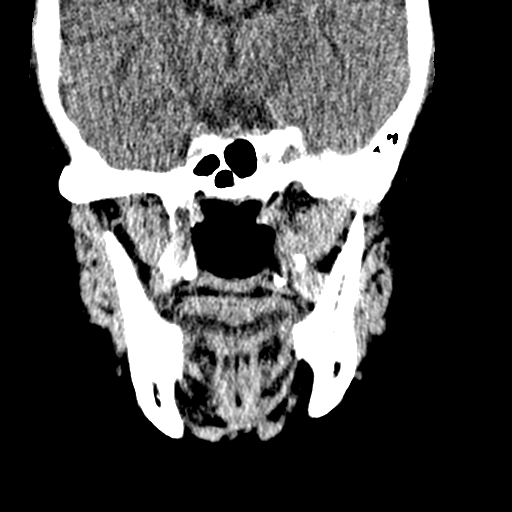
[im 2/11  bone]
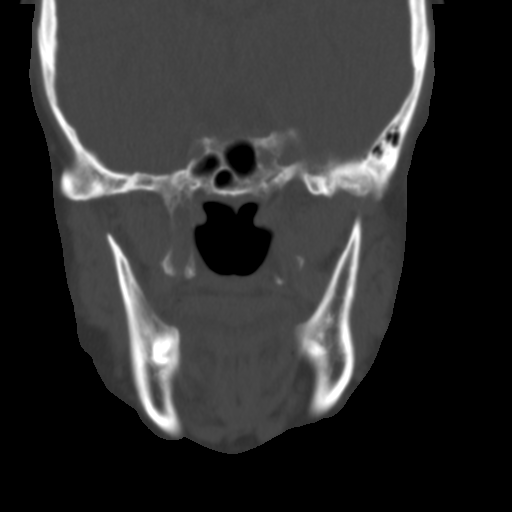
[im 3/11  bone]
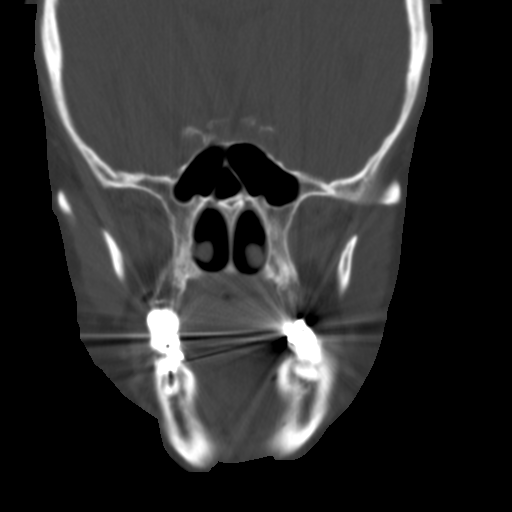
[im 4/11  bone]
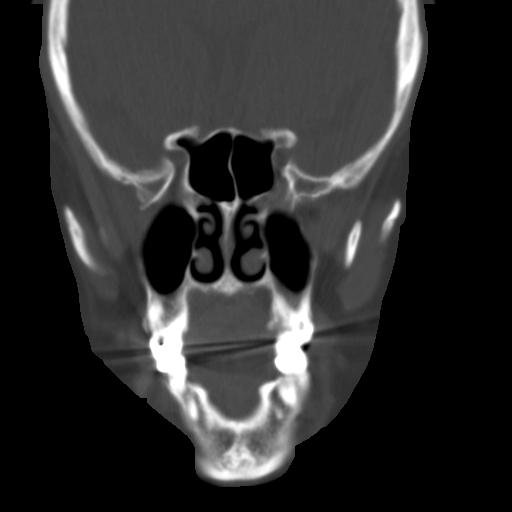
[im 5/11  bone]
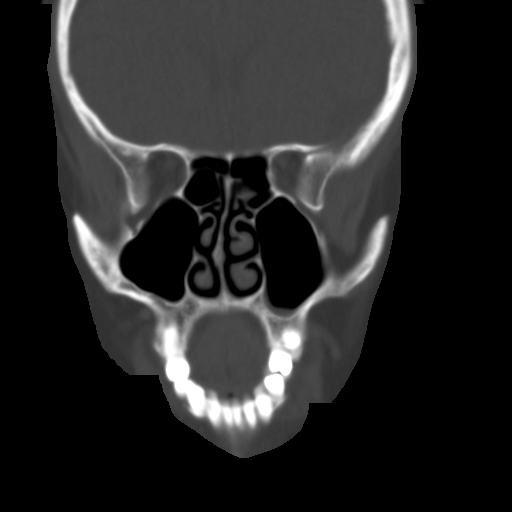
[im 6/11  brain]
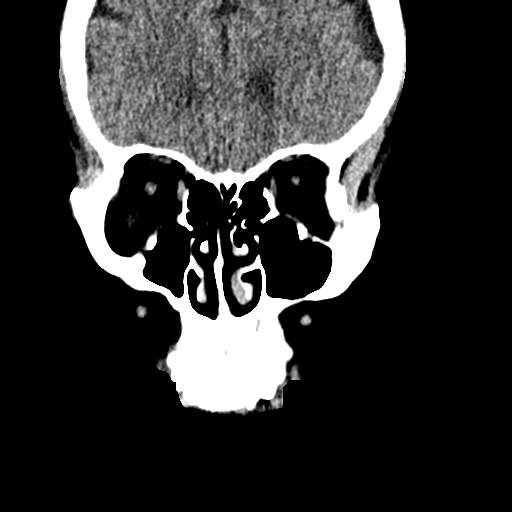
[im 6/11  bone]
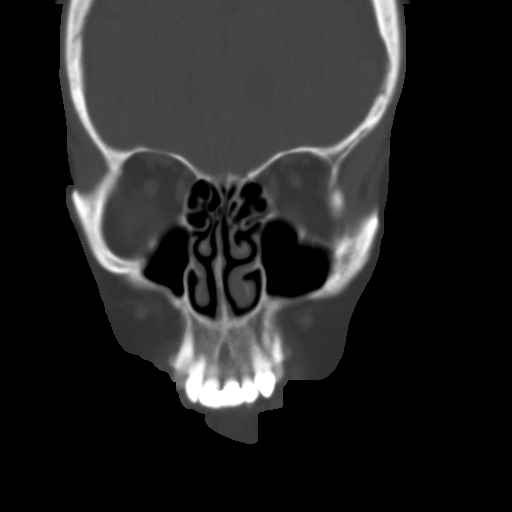
[im 7/11  bone]
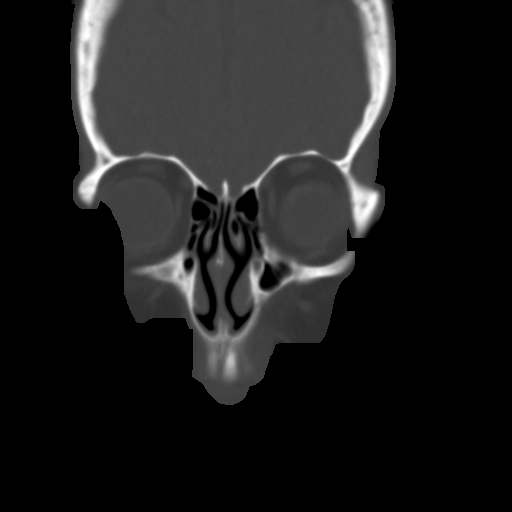
[im 8/11  bone]
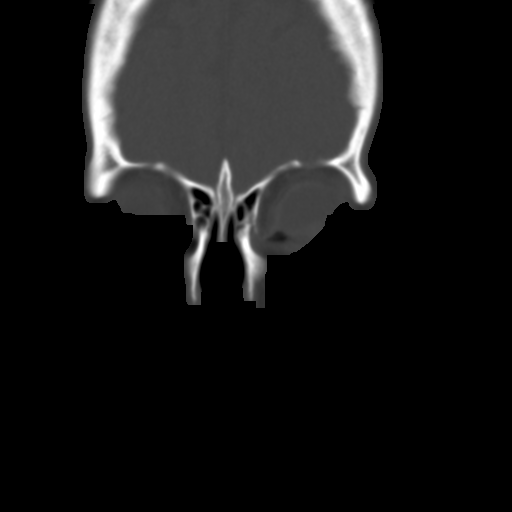
[im 9/11  bone]
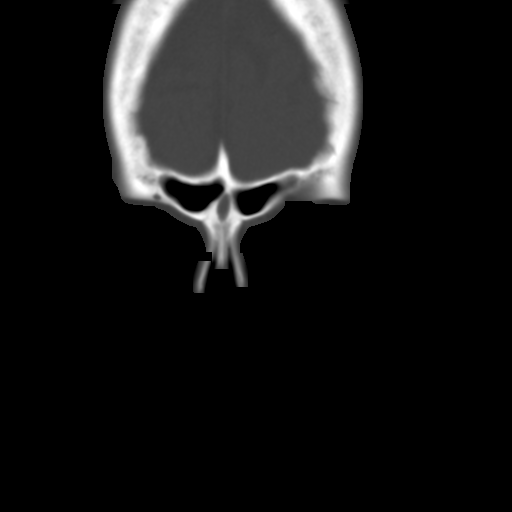
[im 10/11  brain]
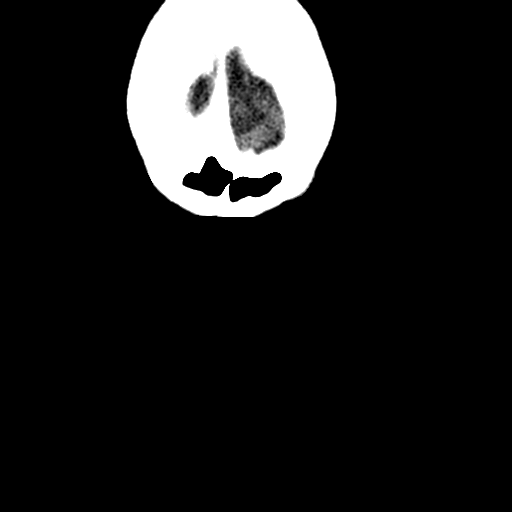
[im 10/11  bone]
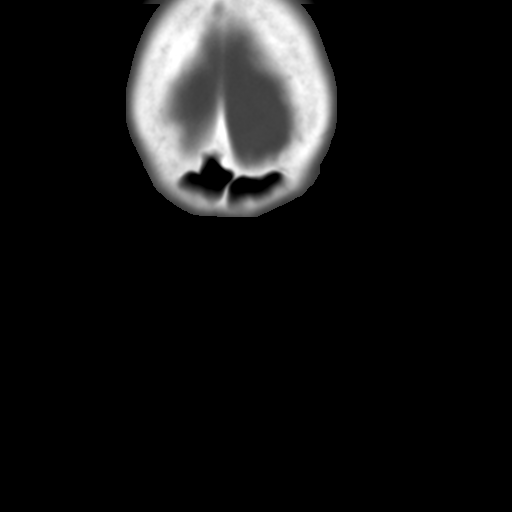

[9 of 12 positions shown; findings below may reference images not displayed]

FINDINGS: Screening exam demonstrates minimal mucosal thickening along the
inferior right maxillary sinus. The remaining paranasal sinuses and
the visualized mastoid air cells are clear

Limited imaging the brain is unremarkable. Visualized globes are
within normal limits.
IMPRESSION: Screening exam demonstrates minimal mucosal thickening of the right
maxillary sinus without other significant paranasal sinus disease.

## 2017-12-14 NOTE — Progress Notes (Signed)
Spoke with pt and notified of results per Dr. Wert. Pt verbalized understanding and denied any questions. 

## 2018-01-12 ENCOUNTER — Ambulatory Visit
Admission: RE | Admit: 2018-01-12 | Discharge: 2018-01-12 | Disposition: A | Discharge status: Home / Self Care | Payer: Medicare Other | Source: Ambulatory Visit | Attending: Family Medicine | Admitting: Family Medicine

## 2018-01-12 DIAGNOSIS — Z1231 Encounter for screening mammogram for malignant neoplasm of breast: Secondary | ICD-10-CM

## 2018-01-12 IMAGING — MG DIGITAL SCREENING BILATERAL MAMMOGRAM WITH TOMO AND CAD
8 series · 9 of 24 positions shown · non-contrast
Comparison: Previous exam(s).

CLINICAL DATA: Screening.

EXAM:
DIGITAL SCREENING BILATERAL MAMMOGRAM WITH TOMO AND CAD

[R CC synth-2D]
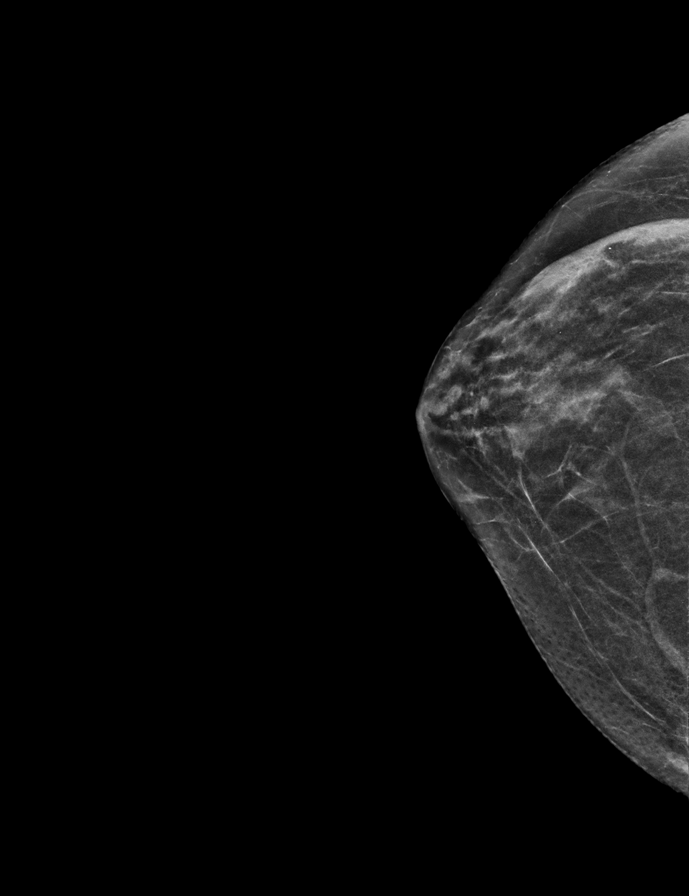

[L CC synth-2D]
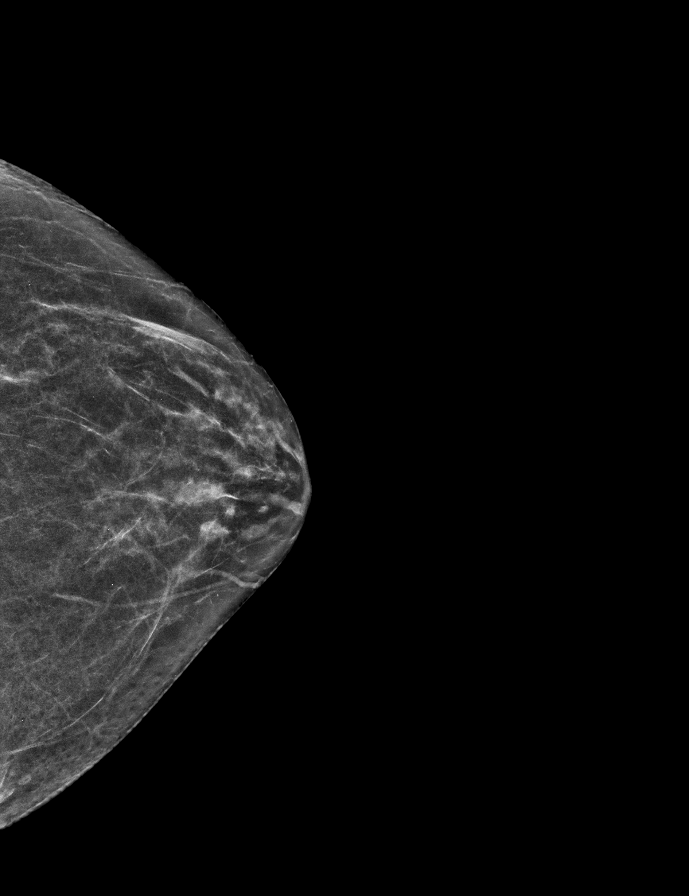

[L MLO synth-2D]
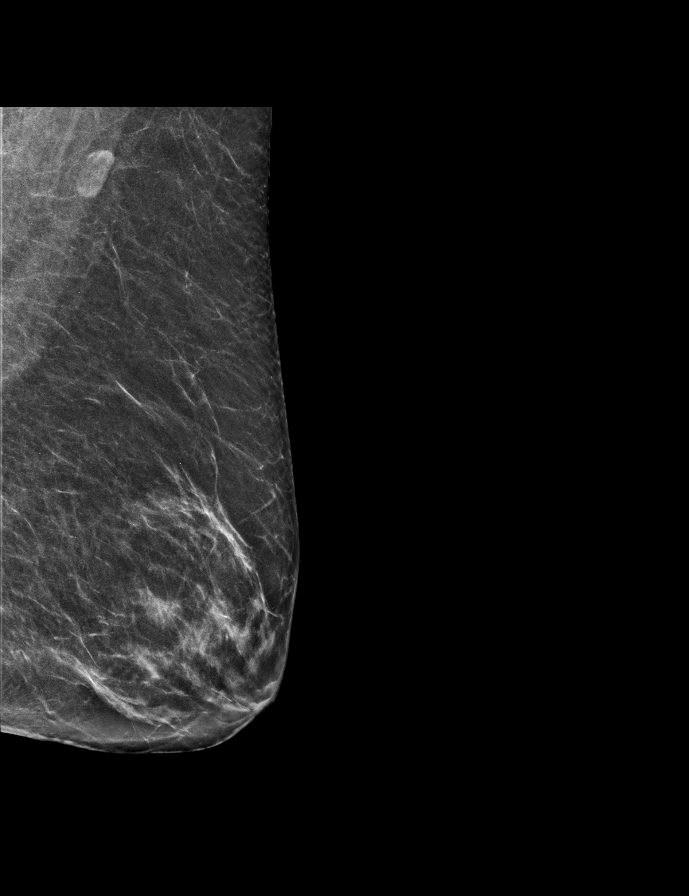

[R MLO synth-2D]
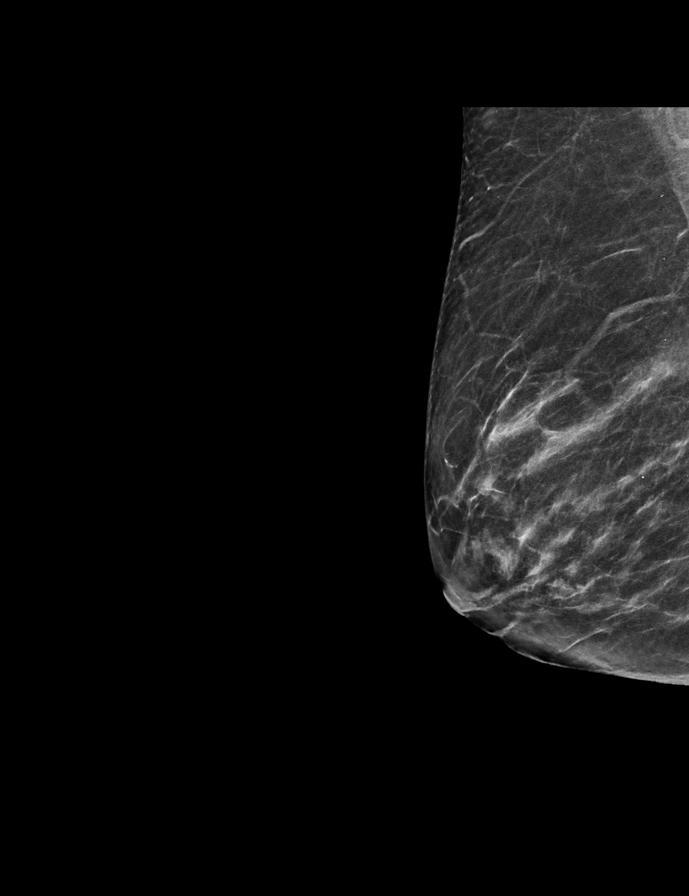

[L CC tomo · 2 of 48 frames shown]
[frame 16/48]
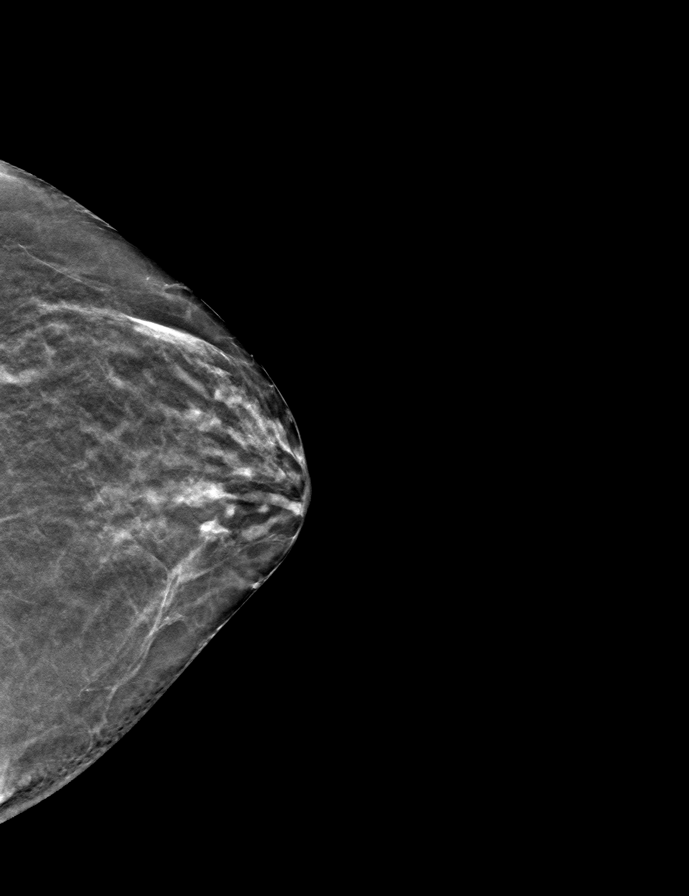
[frame 25/48]
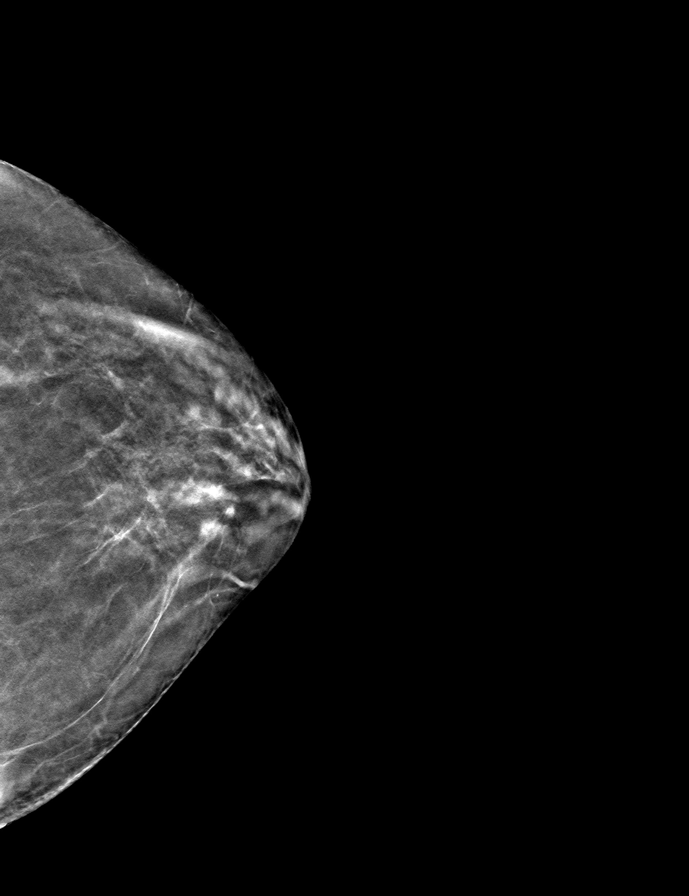

[R CC tomo · tomo slice 25/48.0]
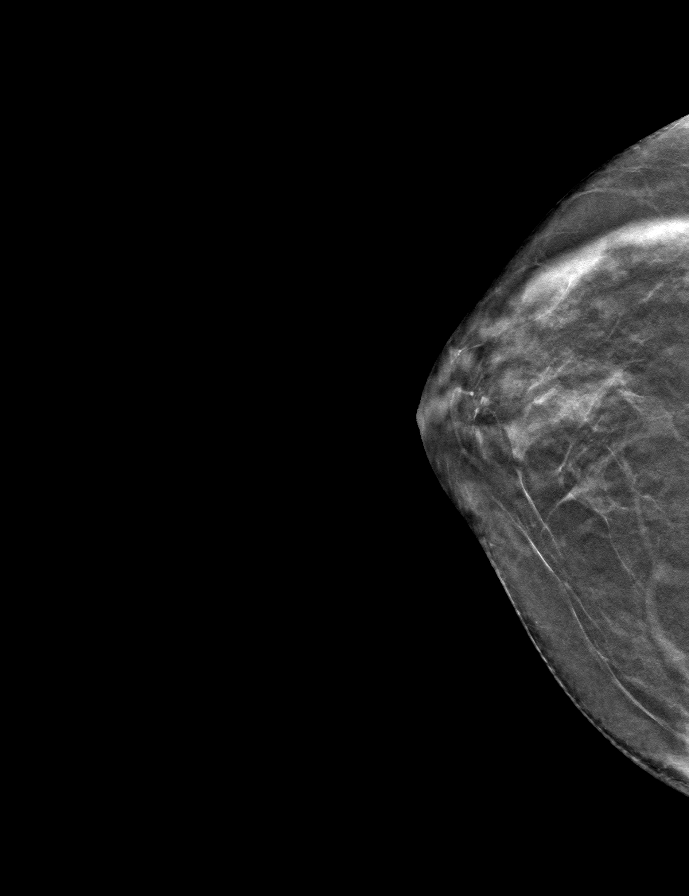

[L MLO tomo · tomo slice 29/56.0]
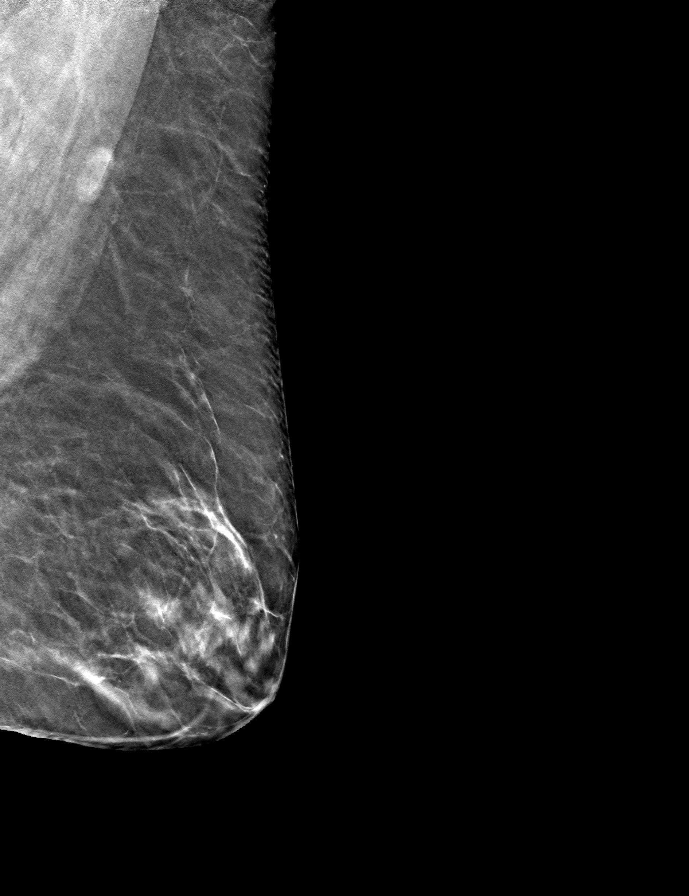

[R MLO tomo · tomo slice 27/54.0]
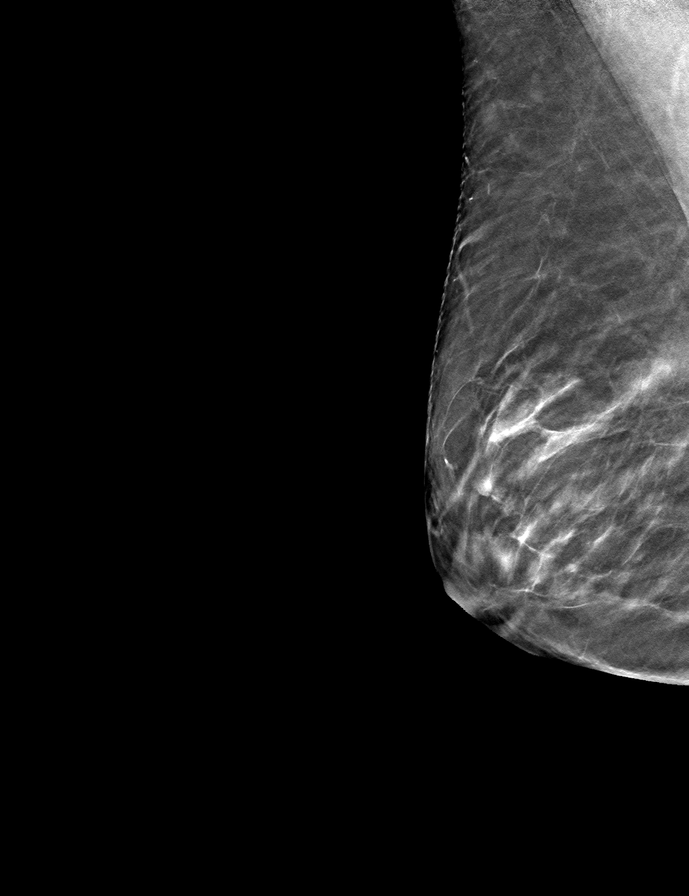

[9 of 24 positions shown; findings below may reference images not displayed]

ACR Breast Density Category b: There are scattered areas of
fibroglandular density.
FINDINGS: There are no findings suspicious for malignancy. Images were
processed with CAD.
IMPRESSION: No mammographic evidence of malignancy. A result letter of this
screening mammogram will be mailed directly to the patient.

RECOMMENDATION:
Screening mammogram in one year. (Code:[TQ])

BI-RADS CATEGORY  1: Negative.

## 2018-01-16 ENCOUNTER — Other Ambulatory Visit: Payer: Medicare Other

## 2018-01-16 ENCOUNTER — Encounter: Payer: Self-pay | Admitting: Internal Medicine

## 2018-01-16 ENCOUNTER — Ambulatory Visit: Payer: Medicare Other | Admitting: Internal Medicine

## 2018-01-16 VITALS — BP 140/90 | HR 79 | Ht 64.0 in | Wt 138.6 lb

## 2018-01-16 DIAGNOSIS — R05 Cough: Secondary | ICD-10-CM | POA: Diagnosis not present

## 2018-01-16 DIAGNOSIS — R053 Chronic cough: Secondary | ICD-10-CM

## 2018-01-16 MED ORDER — FLUTTER DEVI: 1.0000 | 0 refills | Status: AC | PRN

## 2018-01-16 MED ORDER — AZITHROMYCIN 250 MG PO TABS: ORAL_TABLET | ORAL | 0 refills | Status: DC

## 2018-01-16 MED ORDER — AZITHROMYCIN 250 MG PO TABS: ORAL_TABLET | ORAL | 11 refills | Status: DC

## 2018-01-16 NOTE — Patient Instructions (Addendum)
For cough >  mucinex dm 1200 mg every 12 hours with glass and use the flutter valve as much as possible   Please remember to go to the lab department downstairs in the basement  for your tests - we will call you with the results when they are available.   For nasty mucus Take a zpak   GERD (REFLUX)  is an extremely common cause of respiratory symptoms just like yours , many times with no obvious heartburn at all.    It can be treated with medication, but also with lifestyle changes including elevation of the head of your bed (ideally with 6 inch  bed blocks),  Smoking cessation, avoidance of late meals, excessive alcohol, and avoid fatty foods, chocolate, peppermint, colas, red wine, and acidic juices such as orange juice.  NO MINT OR MENTHOL PRODUCTS SO NO COUGH DROPS   USE SUGARLESS CANDY INSTEAD (Jolley ranchers or Stover's or Life Savers) or even ice chips will also do - the key is to swallow to prevent all throat clearing. NO OIL BASED VITAMINS - use powdered substitutes.    You need Prevnar 13- check with Dr Kevan NyGates asap    Please schedule a follow up office visit in 6 weeks, call sooner if needed with pfts on return

## 2018-01-16 NOTE — Progress Notes (Addendum)
Jamesetta OrleansVivian D Thede, female    DOB: 12/06/1945,    MRN: [id number]    Brief patient profile:  72yowf quit smoking 1982 due to IUP no trouble at all but in the 1990s  developed rhinitis  On shots from [facility] and seemed to help p many years and did fine s needing any meds but developed cough around 2017 > [name] eval pos allergy to dust / ragweed and felt it was reflux rx but never took the meds then Jan 2018 cough got worse and dx with RLL air space dx better with abx but never gone and then again cough flared worse Jan 2019 so referred to pulmonary clinic 12/05/2017 by Dr Shaune Pollackonna [name]     History of Present Illness  12/05/2017  Initial office eval/ Wert on Prilosec 20 mg daily 30 min ac since Dec 2018  Chief Complaint  Patient presents with  . Pulmonary Consult    Referred by Dr. Kevan NyGates. Pt c/o cough off and on since she had PNA in Jan 2018- worse since Dec 2019.  Her cough is occ prod with clear to yellow sputum.    each time dx as pneumonia  mucus did change to darker but never cleared completely neither clinically nor radiographically  Dyspnea:  Not limited by breathing from desired activities   Cough: worse when leaning back to read at hs  Sleep: ok at 30 degrees / some fare in am of cough  rec Most likely diagnosis is Bronchiectasis = Bronchiectasis =   you have scarring of your bronchial tubes which means that they don't function perfectly normally and mucus tends to pool in certain areas of your lung which can cause pneumonia and further scarring of your lung and bronchial tubes Continue Priloscec 20 mg Take 30- 60 min before your first and last meals of the day  GERD diet  Please see patient coordinator before you leave today  to schedule a CT sinus and HRCT Chest CT  Please remember to go to the lab department downstairs in the basement  for your tests - we will call you with the results when they are available.    01/16/2018  f/u ov/Wert re: bronchiectasis  Chief Complaint  Patient  presents with  . Follow-up    Pt states she had been doing well since last visit up until this weekend, 8/24-8/25 and her cough was so bad she got to where she could not sleep. Pt states her throat became so raw from coughing and she also got to the point she even used cough drops. Pt states her cough was productive and was coughing up clear to green mucus and also had c/o wheezing.. Denies any SOB or CP/chest tightness.  Dyspnea:  MMRC1 = can walk nl pace, flat grade, can't hurry or go uphills or steps s sob   Cough: much worse x 48 h/ does not remember instructions on how to manage from last ov / given in writing/ starting to turn slightly purulent  Sleeping: typically sleeps fine / 30 degrees  SABA use: none  02: none    No obvious day to day or daytime variability or assoc   mucus plugs or hemoptysis or cp or chest tightness, subjective wheeze or overt sinus or hb symptoms.     Also denies any obvious fluctuation of symptoms with weather or environmental changes or other aggravating or alleviating factors except as outlined above   No unusual exposure hx or h/o childhood pna/ asthma or knowledge  of premature birth.  Current Allergies, Complete Past Medical History, Past Surgical History, Family History, and Social History were reviewed in [employer] [facility] record.  ROS  The following are not active complaints unless bolded Hoarseness, sore throat, dysphagia, dental problems, itching, sneezing,  nasal congestion or discharge of excess mucus or purulent secretions, ear ache,   fever, chills, sweats, unintended wt loss or wt gain, classically pleuritic or exertional cp,  orthopnea pnd or arm/hand swelling  or leg swelling, presyncope, palpitations, abdominal pain, anorexia, nausea, vomiting, diarrhea  or change in bowel habits or change in bladder habits, change in stools or change in urine, dysuria, hematuria,  rash, arthralgias, visual complaints, headache, numbness, weakness or  ataxia or problems with walking or coordination,  change in mood or  memory.        Current Meds  Medication Sig  . CALCIUM PO Take 900 mg by mouth 2 (two) times daily.  . clobetasol (TEMOVATE) 0.05 % external solution clobetasol 0.05 % scalp solution  . hydrocortisone valerate cream (WESTCORT) 0.2 % Apply 1 application topically as needed. Patient uses as needed  To face for rosacea  . metroNIDAZOLE (METROCREAM) 0.75 % cream Apply 1 application topically daily as needed (for rosacea).  . MULTIPLE MINERALS PO Take 1 capsule by mouth daily.  Marland Kitchen omeprazole (PRILOSEC) 20 MG capsule Take 20 mg by mouth daily.  . potassium chloride (MICRO-K) 10 MEQ CR capsule Take 20 mEq by mouth 2 (two) times daily.   Marland Kitchen triamterene-hydrochlorothiazide (DYAZIDE) 37.5-25 MG per capsule Take 1 capsule by mouth every morning.  . TURMERIC PO Take 1 Can by mouth daily.                Objective:      Pleasant amb wf with occ throat clearing / nad    Wt Readings from Last 3 Encounters:  01/16/18 138 lb 9.6 oz (62.9 kg)  12/05/17 139 lb (63 kg)  07/17/14 141 lb (64 kg)     Vital signs reviewed - Note on arrival 02 sats  99% on RA   HEENT: nl dentition, turbinates bilaterally, and oropharynx. Nl external ear canals without cough reflex   NECK :  without JVD/Nodes/TM/ nl carotid upstrokes bilaterally   LUNGS: no acc muscle use,  Nl contour chest with minimal insp/ exp rhonchi   without cough on insp or exp maneuvers   CV:  RRR  no s3 or murmur or increase in P2, and no edema   ABD:  soft and nontender with nl inspiratory excursion in the supine position. No bruits or organomegaly appreciated, bowel sounds nl  MS:  Nl gait/ ext warm without deformities, calf tenderness, cyanosis or clubbing No obvious joint restrictions   SKIN: warm and dry without lesions    NEURO:  alert, approp, nl sensorium with  no motor or cerebellar deficits apparent.            I personally reviewed images and  agree with radiology impression as follows:  CXR:    10/12/17 Slight worsening of right lower lobe opacities compared to the prior study. This could indicate recurrent pneumonia.     Assessment

## 2018-01-17 ENCOUNTER — Encounter: Payer: Self-pay | Admitting: Internal Medicine

## 2018-01-17 LAB — IGG, IGA, IGM
IgG (Immunoglobin G), Serum: 753 mg/dL (ref 600–1540)
IgM, Serum: 1755 mg/dL — ABNORMAL HIGH (ref 50–300)
Immunoglobulin A: 235 mg/dL (ref 20–320)

## 2018-01-17 NOTE — Assessment & Plan Note (Addendum)
Trial of ppi bid 12/05/2017  - Allergy profile 12/05/2017 >  Eos 0.2 /  IgE  695 RAST Pos ragweed/ dust  - Sinus CT 12/13/2017 > ok  - HRCT chest 12/13/2017 >>>  Moderate cylindrical and varicoid bronchiectasis scattered throughout both lungs involving all lung lobes, with associated patchy tree-in-bud opacities. Findings could be due to atypical mycobacterial infection (MAI) or recurrent aspiration. 2. Thick irregular bandlike focus of peribronchovascular consolidation in the basilar right lower lobe, favor evolving postinfectious/postinflammatory scarring.   - Alpha one Screen 12/05/17 :  MM  Level 165   - Quant Ig's 01/16/2018 > nl  - flutter valve training 01/16/2018   Mild flare of bronchiectasis has not started any of the measures rec at last ov so suggested she do so plus rx flutter valve and take zpak anytime she has discoloration   Discussed natural hx / etiology and management again with pt an husband emphasizing this will be a recurrent problem going forwad  Also needs to be sure has had prevnar and keeps up with yearly vaccines > Follow up per Primary Care planned    > 50% of 40 min ov used for counseling/ instruction review    Each maintenance medication was reviewed in detail including most importantly the difference between maintenance and as needed and under what circumstances the prns are to be used.  Please see AVS for specific  Instructions which are unique to this visit and I personally typed out  which were reviewed in detail in writing with the patient and a copy provided.    See device teaching which extended face to face time for this visit

## 2018-02-28 ENCOUNTER — Encounter: Payer: Self-pay | Admitting: Internal Medicine

## 2018-02-28 ENCOUNTER — Ambulatory Visit (INDEPENDENT_AMBULATORY_CARE_PROVIDER_SITE_OTHER): Payer: Medicare Other | Admitting: Internal Medicine

## 2018-02-28 ENCOUNTER — Ambulatory Visit: Payer: Medicare Other | Admitting: Internal Medicine

## 2018-02-28 ENCOUNTER — Ambulatory Visit (INDEPENDENT_AMBULATORY_CARE_PROVIDER_SITE_OTHER)
Admission: RE | Admit: 2018-02-28 | Discharge: 2018-02-28 | Disposition: A | Discharge status: Home / Self Care | Payer: Medicare Other | Source: Ambulatory Visit | Attending: Internal Medicine | Admitting: Internal Medicine

## 2018-02-28 VITALS — BP 152/90 | HR 78 | Ht 62.5 in | Wt 139.0 lb

## 2018-02-28 DIAGNOSIS — J479 Bronchiectasis, uncomplicated: Secondary | ICD-10-CM

## 2018-02-28 DIAGNOSIS — R053 Chronic cough: Secondary | ICD-10-CM

## 2018-02-28 DIAGNOSIS — R05 Cough: Secondary | ICD-10-CM

## 2018-02-28 IMAGING — DX DG CHEST 2V
2 series · 2 of 2 positions shown · non-contrast
Comparison: Chest x-ray of [DATE].

CLINICAL DATA: History of bronchiectasis, former smoker,
hypertension. No current complaints.

EXAM:
CHEST - 2 VIEW

[chest pa]
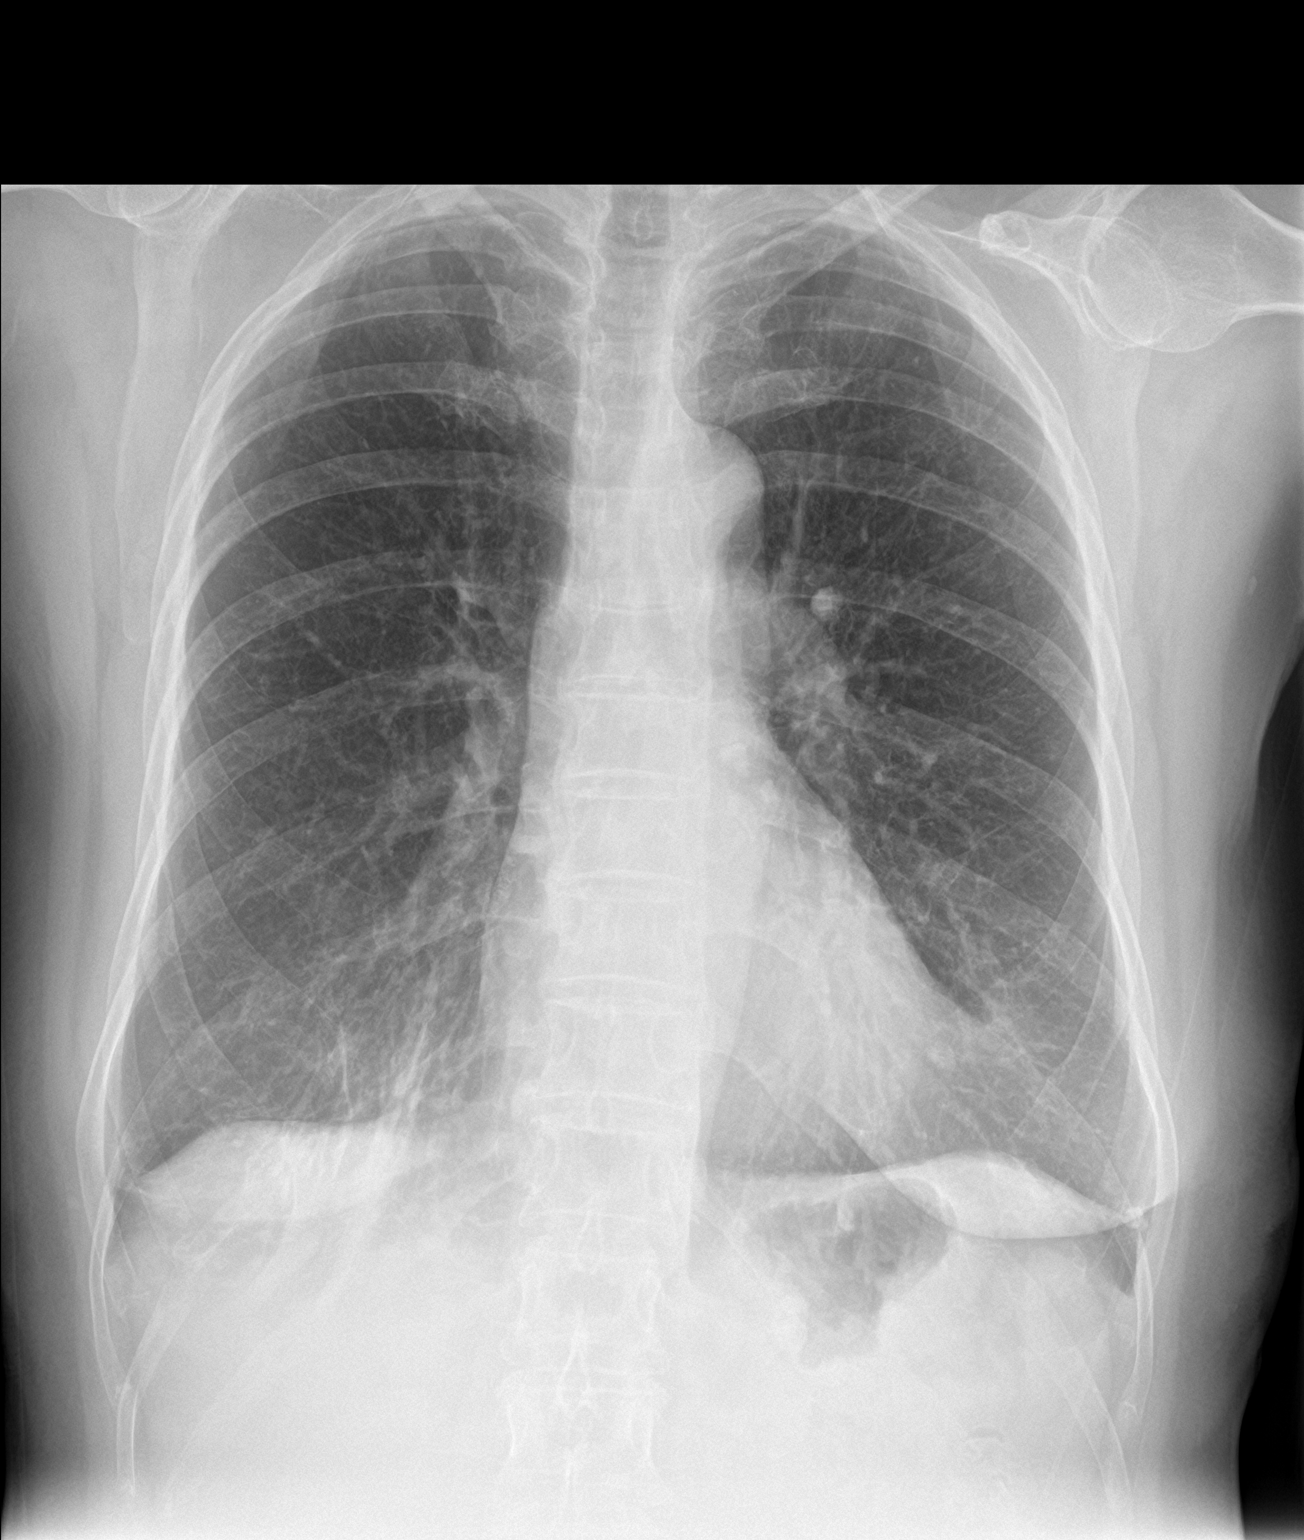

[chest lat]
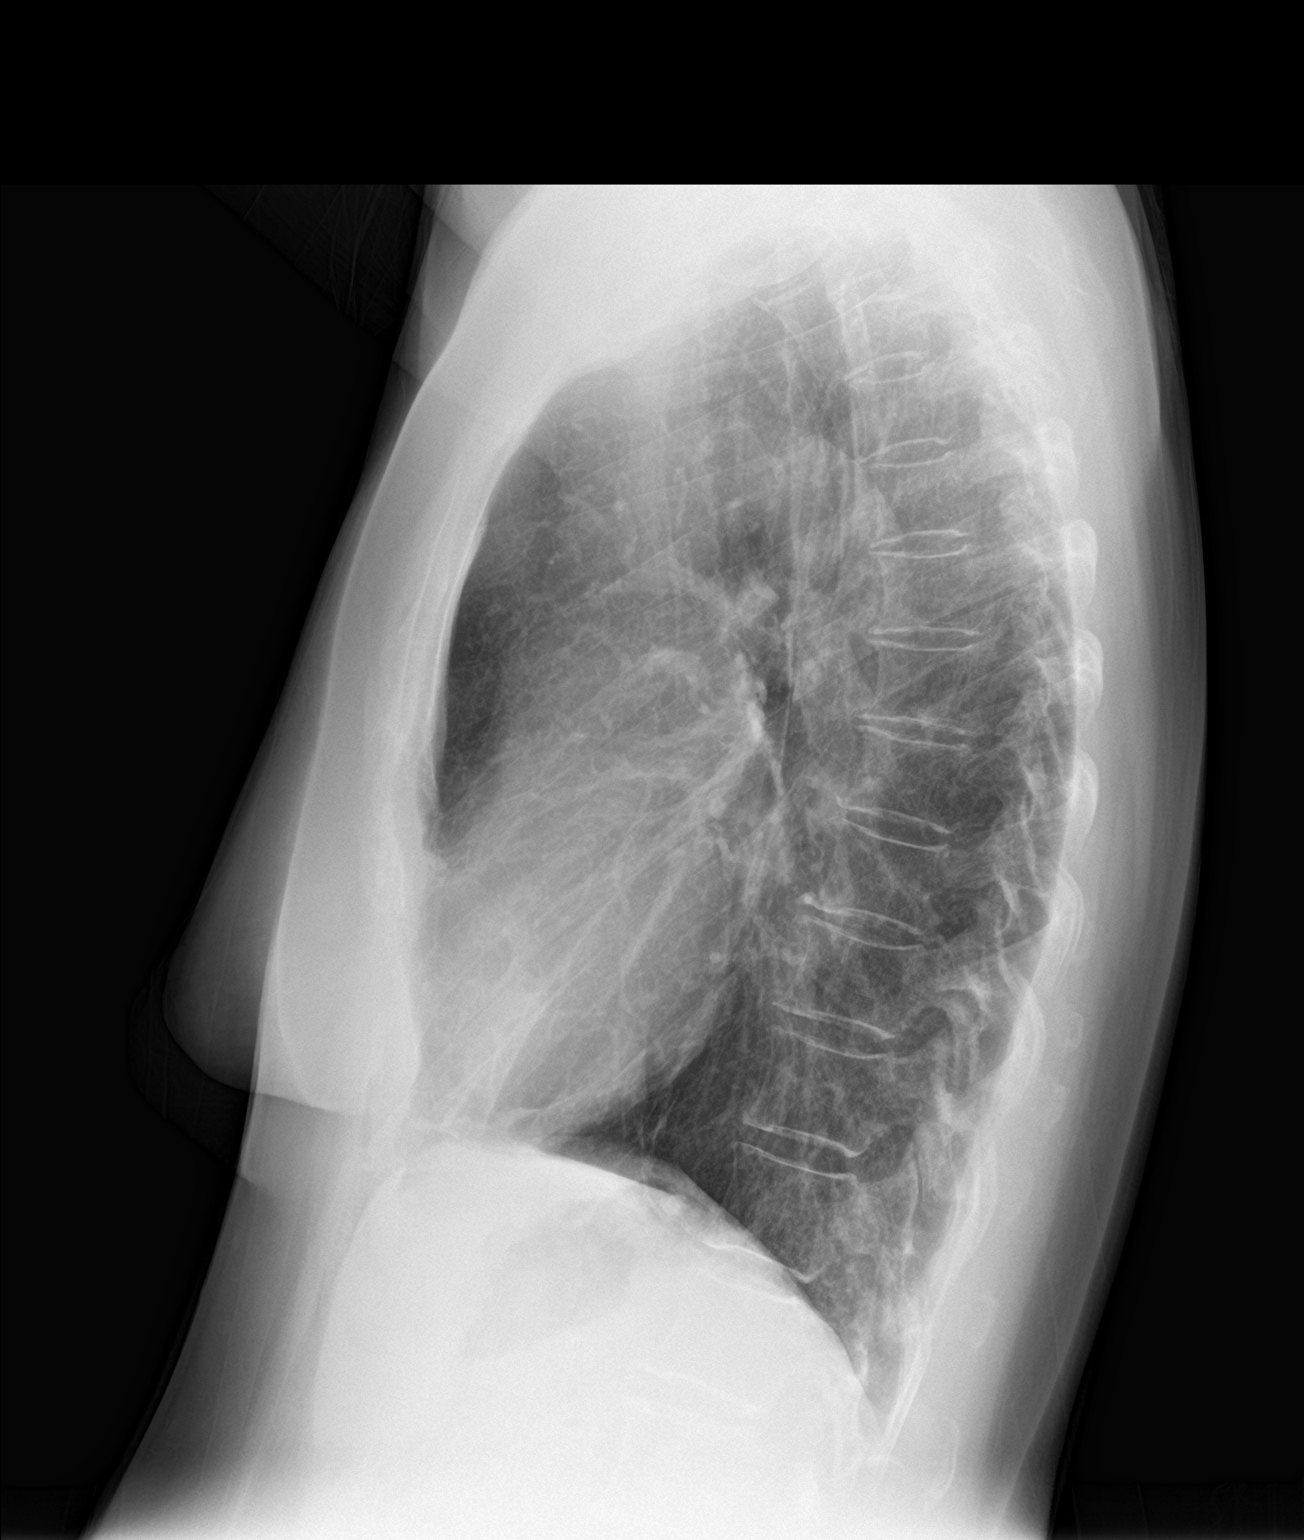

[2 of 2 positions shown; findings below may reference images not displayed]

FINDINGS: The lungs are hyperinflated with mild hemidiaphragm flattening.
There stable coarse lung markings at the right lung base
posteriorly. The left lung is clear. The heart and pulmonary
vascularity are normal. The trachea is midline. The bony thorax is
unremarkable.
IMPRESSION: Hyperinflation consistent with chronic bronchitis and the history of
bronchiectasis. No alveolar pneumonia nor CHF.

## 2018-02-28 MED ORDER — AZITHROMYCIN 250 MG PO TABS: ORAL_TABLET | ORAL | 11 refills | Status: DC

## 2018-02-28 NOTE — Progress Notes (Signed)
Connie Lindsey, female    DOB: [birth year],    MRN: [id number]    Brief patient profile:  72yowf quit smoking 1982 due to IUP no trouble at all but in the 1990s  developed rhinitis  On shots from [facility] and seemed to help p many years and did fine s needing any meds but developed cough around 2017 > [name] eval pos allergy to dust / ragweed and felt it was reflux rx but never took the meds then Jan 2018 cough got worse and dx with RLL air space dx better with abx but never gone and then again cough flared worse Jan 2019 so referred to pulmonary clinic 12/05/2017 by Dr Shaune Pollack     History of Present Illness  12/05/2017  Initial office eval/ [name] on Prilosec 20 mg daily 30 min ac since Dec 2018  Chief Complaint  Patient presents with  . Pulmonary Consult    Referred by Dr. Kevan Ny. Pt c/o cough off and on since she had PNA in Jan 2018- worse since Dec 2019.  Her cough is occ prod with clear to yellow sputum.    each time dx as pneumonia  mucus did change to darker but never cleared completely neither clinically nor radiographically  Dyspnea:  Not limited by breathing from desired activities   Cough: worse when leaning back to read at hs  Sleep: ok at 30 degrees / some fare in am of cough  rec Most likely diagnosis is Bronchiectasis = Bronchiectasis =   you have scarring of your bronchial tubes which means that they don't function perfectly normally and mucus tends to pool in certain areas of your lung which can cause pneumonia and further scarring of your lung and bronchial tubes Continue Priloscec 20 mg Take 30- 60 min before your first and last meals of the day  GERD diet  Please see patient coordinator before you leave today  to schedule a CT sinus and HRCT Chest CT  Please remember to go to the lab department downstairs in the basement  for your tests - we will call you with the results when they are available.    01/16/2018  f/u ov/[name] re: bronchiectasis  Chief Complaint  Patient  presents with  . Follow-up    Pt states she had been doing well since last visit up until this weekend, 8/24-8/25 and her cough was so bad she got to where she could not sleep. Pt states her throat became so raw from coughing and she also got to the point she even used cough drops. Pt states her cough was productive and was coughing up clear to green mucus and also had c/o wheezing.. Denies any SOB or CP/chest tightness.  Dyspnea:  MMRC1 = can walk nl pace, flat grade, can't hurry or go uphills or steps s sob   Cough: much worse x 48 h/ does not remember instructions on how to manage from last ov / given in writing/ starting to turn slightly purulent  Sleeping: typically sleeps fine / 30 degrees  SABA use: none  02: none   rec For cough >  mucinex dm 1200 mg every 12 hours with glass and use the flutter valve as much as possible For nasty mucus Take a zpak  GERD   You need Prevnar 13- check with Dr Kevan Ny asap  Please schedule a follow up office visit in 6 weeks, call sooner if needed with pfts on return     02/28/2018  f/u ov/[name]  re: bornchiectasis s flare Chief Complaint  Patient presents with  . Follow-up    PFT scheduled for today, but unable to complete due to cough.  She reports that her cough has improved some since the last visit.   Dyspnea:  Not limited by breathing from desired activities   Cough: gone Sleeping: 30 degrees due to symptoms of obvious gerd at flatter angles  SABA use: none     No obvious day to day or daytime variability or assoc excess/ purulent sputum or mucus plugs or hemoptysis or cp or chest tightness, subjective wheeze or overt sinus or hb symptoms as long as keeps head up   Sleeping as above  without nocturnal  or early am exacerbation  of respiratory  c/o's or need for noct saba. Also denies any obvious fluctuation of symptoms with weather or environmental changes or other aggravating or alleviating factors except as outlined above   No unusual  exposure hx or h/o childhood pna/ asthma or knowledge of premature birth.  Current Allergies, Complete Past Medical History, Past Surgical History, Family History, and Social History were reviewed in [employer] [facility] record.  ROS  The following are not active complaints unless bolded Hoarseness, sore throat, dysphagia, dental problems, itching, sneezing,  nasal congestion or discharge of excess mucus or purulent secretions, ear ache,   fever, chills, sweats, unintended wt loss or wt gain, classically pleuritic or exertional cp,  orthopnea pnd or arm/hand swelling  or leg swelling, presyncope, palpitations, abdominal pain, anorexia, nausea, vomiting, diarrhea  or change in bowel habits or change in bladder habits, change in stools or change in urine, dysuria, hematuria,  rash, arthralgias, visual complaints, headache, numbness, weakness or ataxia or problems with walking or coordination,  change in mood or  memory.        Current Meds  Medication Sig  . CALCIUM PO Take 900 mg by mouth 2 (two) times daily.  . clobetasol (TEMOVATE) 0.05 % external solution As needed  . hydrocortisone valerate cream (WESTCORT) 0.2 % Apply 1 application topically as needed.   . metroNIDAZOLE (METROCREAM) 0.75 % cream Apply 1 application topically daily as needed (for rosacea).  . MULTIPLE MINERALS PO Take 1 capsule by mouth daily.  Marland Kitchen omeprazole (PRILOSEC) 20 MG capsule Take 20 mg Take 30- 60 min before your first and last meals of the day   . potassium chloride (MICRO-K) 10 MEQ CR capsule Take 20 mEq by mouth 2 (two) times daily.   Marland Kitchen Respiratory Therapy Supplies (FLUTTER) DEVI 1 Device by Does not apply route as needed.  . triamterene-hydrochlorothiazide (DYAZIDE) 37.5-25 MG per capsule Take 1 capsule by mouth every morning.  . TURMERIC PO Take 1 capsule by mouth daily.              Objective:      Pleasant amb wf nad      02/28/2018      139   01/16/18 138 lb 9.6 oz (62.9 kg)  12/05/17  139 lb (63 kg)  07/17/14 141 lb (64 kg)    Vital signs reviewed - Note on arrival 02 sats  95% on RA       HEENT: nl dentition, turbinates bilaterally, and oropharynx. Nl external ear canals without cough reflex   NECK :  without JVD/Nodes/TM/ nl carotid upstrokes bilaterally   LUNGS: no acc muscle use,  Nl contour chest which is clear to A and P bilaterally without cough on insp or exp maneuvers   CV:  RRR  no s3 or murmur or increase in P2, and no edema   ABD:  soft and nontender with nl inspiratory excursion in the supine position. No bruits or organomegaly appreciated, bowel sounds nl  MS:  Nl gait/ ext warm without deformities, calf tenderness, cyanosis or clubbing No obvious joint restrictions   SKIN: warm and dry without lesions    NEURO:  alert, approp, nl sensorium with  no motor or cerebellar deficits apparent.          CXR PA and Lateral:   02/28/2018 :    I personally reviewed images and agree with radiology impression as follows:   Hyperinflation consistent with chronic bronchitis and the history of bronchiectasis. No alveolar pneumonia nor CHF.       Assessment

## 2018-02-28 NOTE — Patient Instructions (Signed)
No change in recommendations   Please remember to go to the  x-ray department downstairs in the basement  for your tests - we will call you with the results when they are available.       Please schedule a follow up visit in 3 months but call sooner if needed

## 2018-02-28 NOTE — Progress Notes (Signed)
PFT was attempted today but could not be completed due to the pt coughing. Pt gave a good effort.

## 2018-03-01 ENCOUNTER — Encounter: Payer: Self-pay | Admitting: Internal Medicine

## 2018-03-01 NOTE — Progress Notes (Signed)
Spoke with pt and notified of results per Dr. Wert. Pt verbalized understanding and denied any questions. 

## 2018-03-01 NOTE — Assessment & Plan Note (Addendum)
Trial of ppi bid 12/05/2017  - Allergy profile 12/05/2017 >  Eos 0.2 /  IgE  695 RAST Pos ragweed/ dust  - Sinus CT 12/13/2017 > ok  - HRCT chest 12/13/2017 >>>  Moderate cylindrical and varicoid bronchiectasis scattered throughout both lungs involving all lung lobes, with associated patchy tree-in-bud opacities. Findings could be due to atypical mycobacterial infection (MAI) or recurrent aspiration. 2. Thick irregular bandlike focus of peribronchovascular consolidation in the basilar right lower lobe, favor evolving postinfectious/postinflammatory scarring.   - Alpha one Screen 12/05/17 :  MM  Level 165  - Quant Ig's 01/16/2018 > nl  - flutter valve training 01/16/2018   - Spirometry 02/28/2018  FEV1 1.35 (67%)  Ratio 67  With truncated upper portion of f/v loop  -this is non-physiologic and not indicative of true airflow obst   Pt self managing extremely well at this point so no change in rx   I had an extended discussion with the patient reviewing all relevant studies completed to date and  lasting 15 to 20 minutes of a 25 minute visit on the following ongoing concerns:   This is an extremely common benign condition in the elderly and does not warrant aggressive eval/ rx at this point unless there is a clinical correlation suggesting unaddressed pulmonary infection (purulent sputum, night sweats, unintended wt loss, doe) or evolution of  obvious changes on plain cxr/ absent here (as opposed to serial CT, which is way over sensitive to make clinical decisions re intervention and treatment in the elderly, who tend to tolerate both dx and treatment poorly)    Therefore rec just use zpak prn and continue with efforts to improve cough mechanics eg flutter as instructed  Discussed in detail all the  indications, usual  risks and alternatives  relative to the benefits with patient who agrees to proceed with conservative f/u as outlined    Each maintenance medication was reviewed in detail including  most importantly the difference between maintenance and as needed and under what circumstances the prns are to be used.  Please see AVS for specific  Instructions which are unique to this visit and I personally typed out  which were reviewed in detail in writing with the patient and a copy provided.

## 2018-03-03 ENCOUNTER — Encounter: Payer: Self-pay | Admitting: Internal Medicine

## 2018-03-03 LAB — PULMONARY FUNCTION TEST
FEF 25-75 PRE: 0.92 L/s
FEF2575-%PRED-PRE: 56 %
FEV1-%Pred-Pre: 67 %
FEV1-Pre: 1.35 L
FEV1FVC-%PRED-PRE: 89 %
FEV6-%Pred-Pre: 78 %
FEV6-PRE: 1.99 L
FEV6FVC-%Pred-Pre: 104 %
FVC-%PRED-PRE: 75 %
FVC-Pre: 2.01 L
Pre FEV1/FVC ratio: 67 %
Pre FEV6/FVC Ratio: 99 %

## 2018-06-01 ENCOUNTER — Ambulatory Visit: Payer: Medicare Other | Admitting: Internal Medicine

## 2018-06-12 ENCOUNTER — Ambulatory Visit: Payer: Medicare Other | Admitting: Internal Medicine

## 2018-06-12 ENCOUNTER — Encounter: Payer: Self-pay | Admitting: Internal Medicine

## 2018-06-12 VITALS — BP 138/72 | HR 84 | Ht 63.75 in | Wt 143.6 lb

## 2018-06-12 DIAGNOSIS — J479 Bronchiectasis, uncomplicated: Secondary | ICD-10-CM

## 2018-06-12 MED ORDER — AZITHROMYCIN 250 MG PO TABS: ORAL_TABLET | ORAL | Status: DC

## 2018-06-12 NOTE — Assessment & Plan Note (Addendum)
Onset cough 2017   Trial of ppi bid 12/05/2017  - Allergy profile 12/05/2017 >  Eos 0.2 /  IgE  695 RAST Pos ragweed/ dust  - Sinus CT 12/13/2017 > ok  - HRCT chest 12/13/2017 >>>  Moderate cylindrical and varicoid bronchiectasis scattered throughout both lungs involving all lung lobes, with associated patchy tree-in-bud opacities. Findings could be due to atypical mycobacterial infection (MAI) or recurrent aspiration. 2. Thick irregular bandlike focus of peribronchovascular consolidation in the basilar right lower lobe, favor evolving postinfectious/postinflammatory scarring.   - Alpha one Screen 12/05/17 :  MM  Level 165  - Quant Ig's 01/16/2018 > nl  - flutter valve training 01/16/2018  - Spirometry 02/28/2018  FEV1 1.35 (67%)  Ratio 67  With truncated upper portion of f/v loop   I had an extended discussion with the patient and husband reviewing all relevant studies completed to date and  lasting 15 to 20 minutes of a 25 minute visit on the following ongoing concerns:   1) difference between intermittent uri vs bronchiectasis flare reviewed using time course of "better p a week" vs "no change to worse p  Week reviewed  2) can't use mucinex dm or flutter too much  3) probably the same is true for zpak but for now it's a judgement call and there are no accepted guidelines re :best practice in this regard, at least not in the [place] [employer] literature based on Review in Chest 2017   4) Each maintenance medication was reviewed in detail including most importantly the difference between maintenance and as needed and under what circumstances the prns are to be used.  Please see AVS for specific  Instructions which are unique to this visit and I personally typed out  which were reviewed in detail in writing with the patient and a copy provided.    5)  device teaching = flutter valve extended face to face time for this visit    6)  f/u in 6 m, sooner if needed with cxr on return

## 2018-06-12 NOTE — Patient Instructions (Addendum)
For cough >>>  mucinex dm 1200 mg every 12 hours with glass and use the flutter valve as much as possible  For nasty mucus >>>  Take a zpak  And continue the mucinex dm and flutter valve use     Please schedule a follow up visit in 6 months but call sooner if needed with cxr on return    .

## 2018-06-12 NOTE — Progress Notes (Signed)
Connie Lindsey, female    DOB: [birth year],    MRN: [id number]    Brief patient profile:  72yowf quit smoking 1982 due to IUP no trouble at all but in the 1990s  developed rhinitis  On shots from [facility] and seemed to help p many years and did fine s needing any meds but developed cough around 2017 > [name] eval pos allergy to dust / ragweed and felt it was reflux rx but never took the meds then Jan 2018 cough got worse and dx with RLL air space dx better with abx but never gone and then again cough flared worse Jan 2019 so referred to pulmonary clinic 12/05/2017 by Dr Shaune Pollack     History of Present Illness  12/05/2017  Initial office eval/ Wert on Prilosec 20 mg daily 30 min ac since Dec 2018  Chief Complaint  Patient presents with  . Pulmonary Consult    Referred by Dr. Kevan Ny. Pt c/o cough off and on since she had PNA in Jan 2018- worse since Dec 2019.  Her cough is occ prod with clear to yellow sputum.    each time dx as pneumonia  mucus did change to darker but never cleared completely neither clinically nor radiographically  Dyspnea:  Not limited by breathing from desired activities   Cough: worse when leaning back to read at hs  Sleep: ok at 30 degrees / some fare in am of cough  rec Most likely diagnosis is Bronchiectasis = Bronchiectasis =   you have scarring of your bronchial tubes which means that they don't function perfectly normally and mucus tends to pool in certain areas of your lung which can cause pneumonia and further scarring of your lung and bronchial tubes Continue Priloscec 20 mg Take 30- 60 min before your first and last meals of the day  GERD diet  Please see patient coordinator before you leave today  to schedule a CT sinus and HRCT Chest CT  Please remember to go to the lab department downstairs in the basement  for your tests - we will call you with the results when they are available.    01/16/2018  f/u ov/Wert re: bronchiectasis  Chief Complaint  Patient  presents with  . Follow-up    Pt states she had been doing well since last visit up until this weekend, 8/24-8/25 and her cough was so bad she got to where she could not sleep. Pt states her throat became so raw from coughing and she also got to the point she even used cough drops. Pt states her cough was productive and was coughing up clear to green mucus and also had c/o wheezing.. Denies any SOB or CP/chest tightness.  Dyspnea:  MMRC1 = can walk nl pace, flat grade, can't hurry or go uphills or steps s sob   Cough: much worse x 48 h/ does not remember instructions on how to manage from last ov / given in writing/ starting to turn slightly purulent  Sleeping: typically sleeps fine / 30 degrees  SABA use: none  02: none   rec For cough >  mucinex dm 1200 mg every 12 hours with glass and use the flutter valve as much as possible For nasty mucus Take a zpak  GERD   You need Prevnar 13- check with Dr Kevan Ny asap  Please schedule a follow up office visit in 6 weeks, call sooner if needed with pfts on return     02/28/2018  f/u ov/Wert  re: bornchiectasis s flare Chief Complaint  Patient presents with  . Follow-up    PFT scheduled for today, but unable to complete due to cough.  She reports that her cough has improved some since the last visit.   Dyspnea:  Not limited by breathing from desired activities   Cough: gone Sleeping: 30 degrees due to symptoms of obvious gerd at flatter angles  rec No change rx      06/12/2018  f/u ov/Wert re: s/p bronchiectasis flare - lots of questions re management  Chief Complaint  Patient presents with  . Follow-up    cough with little clear to light yellow mucus  Dyspnea:  Not limited by breathing from desired activities   Cough: worse since few weeks in Dec 2019 but started zpak May 18 2018 / am mucus turned clear / using flutter some but did not understand how/ when to use mucinex dm or whether could use if with zmax Sleeping: fine  SABA use:  none 02: none   No obvious day to day or daytime variability or assoc excess/ purulent sputum or mucus plugs or hemoptysis or cp or chest tightness, subjective wheeze or overt sinus or hb symptoms.   Sleeping  without nocturnal  or early am exacerbation  of respiratory  c/o's or need for noct saba. Also denies any obvious fluctuation of symptoms with weather or environmental changes or other aggravating or alleviating factors except as outlined above   No unusual exposure hx or h/o childhood pna/ asthma or knowledge of premature birth.  Current Allergies, Complete Past Medical History, Past Surgical History, Family History, and Social History were reviewed in [facility] electronic medical record.  ROS  The following are not active complaints unless bolded Hoarseness, sore throat, dysphagia, dental problems, itching, sneezing,  nasal congestion or discharge of excess mucus or purulent secretions, ear ache,   fever, chills, sweats, unintended wt loss or wt gain, classically pleuritic or exertional cp,  orthopnea pnd or arm/hand swelling  or leg swelling, presyncope, palpitations, abdominal pain, anorexia, nausea, vomiting, diarrhea  or change in bowel habits or change in bladder habits, change in stools or change in urine, dysuria, hematuria,  rash, arthralgias, visual complaints, headache, numbness, weakness or ataxia or problems with walking or coordination,  change in mood or  memory.        Current Meds  Medication Sig  . CALCIUM PO Take 600 mg by mouth 2 (two) times daily.   . Carboxymethylcellul-Glycerin (REFRESH OPTIVE OP) Place 1 drop into both eyes 2 (two) times daily.  . clobetasol (TEMOVATE) 0.05 % external solution As needed  . hydrocortisone valerate cream (WESTCORT) 0.2 % Apply 1 application topically as needed.   . metroNIDAZOLE (METROCREAM) 0.75 % cream Apply 1 application topically as needed (for rosacea).   . MULTIPLE MINERALS PO Take 1 capsule by mouth daily.  Marland Kitchen.  omeprazole (PRILOSEC) 20 MG capsule Take 20 mg by mouth 2 (two) times daily.   . potassium chloride (MICRO-K) 10 MEQ CR capsule Take 20 mEq by mouth 2 (two) times daily.   Marland Kitchen. Respiratory Therapy Supplies (FLUTTER) DEVI 1 Device by Does not apply route as needed.  . triamterene-hydrochlorothiazide (DYAZIDE) 37.5-25 MG per capsule Take 1 capsule by mouth every morning.  .     .              Objective:      amb wf nad    06/12/2018        143  02/28/2018      139   01/16/18 138 lb 9.6 oz (62.9 kg)  12/05/17 139 lb (63 kg)  07/17/14 141 lb (64 kg)    Vital signs reviewed - Note on arrival 02 sats  97% on RA   HEENT: nl dentition, turbinates bilaterally, and oropharynx. Nl external ear canals without cough reflex   NECK :  without JVD/Nodes/TM/ nl carotid upstrokes bilaterally   LUNGS: no acc muscle use,  Nl contour chest which is clear to A and P bilaterally without cough on insp or exp maneuvers   CV:  RRR  no s3 or murmur or increase in P2, and no edema   ABD:  soft and nontender with nl inspiratory excursion in the supine position. No bruits or organomegaly appreciated, bowel sounds nl  MS:  Nl gait/ ext warm without deformities, calf tenderness, cyanosis or clubbing No obvious joint restrictions   SKIN: warm and dry without lesions    NEURO:  alert, approp, nl sensorium with  no motor or cerebellar deficits apparent.                 Assessment

## 2018-12-12 ENCOUNTER — Encounter: Payer: Self-pay | Admitting: Internal Medicine

## 2018-12-12 ENCOUNTER — Ambulatory Visit: Payer: Medicare Other | Admitting: Internal Medicine

## 2018-12-12 ENCOUNTER — Other Ambulatory Visit: Payer: Self-pay

## 2018-12-12 ENCOUNTER — Ambulatory Visit (INDEPENDENT_AMBULATORY_CARE_PROVIDER_SITE_OTHER): Payer: Medicare Other

## 2018-12-12 DIAGNOSIS — J479 Bronchiectasis, uncomplicated: Secondary | ICD-10-CM | POA: Diagnosis not present

## 2018-12-12 IMAGING — DX CHEST - 2 VIEW
2 series · 2 of 2 positions shown · non-contrast
Comparison: [DATE]

CLINICAL DATA: Evaluation for cough

EXAM:
CHEST - 2 VIEW

[chest pa]
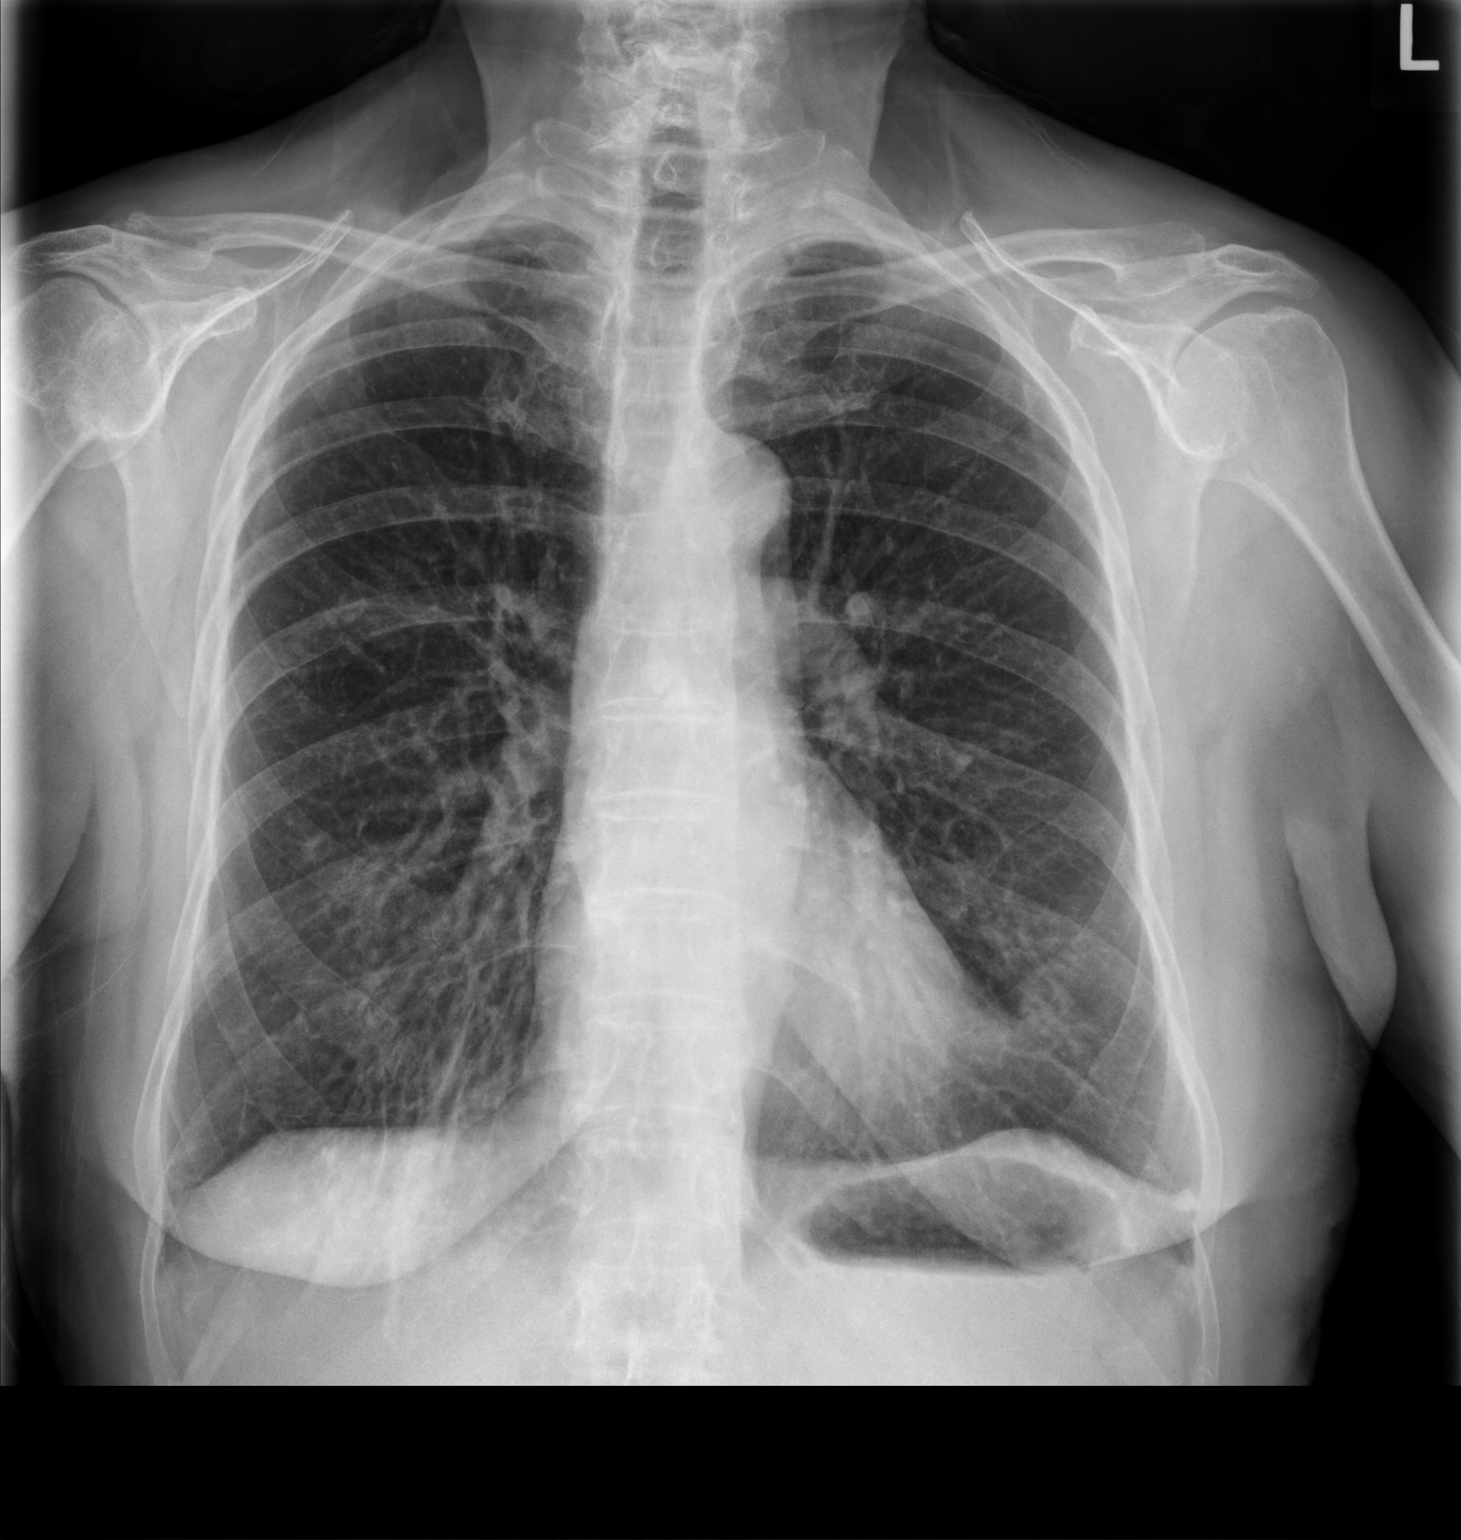

[chest lat]
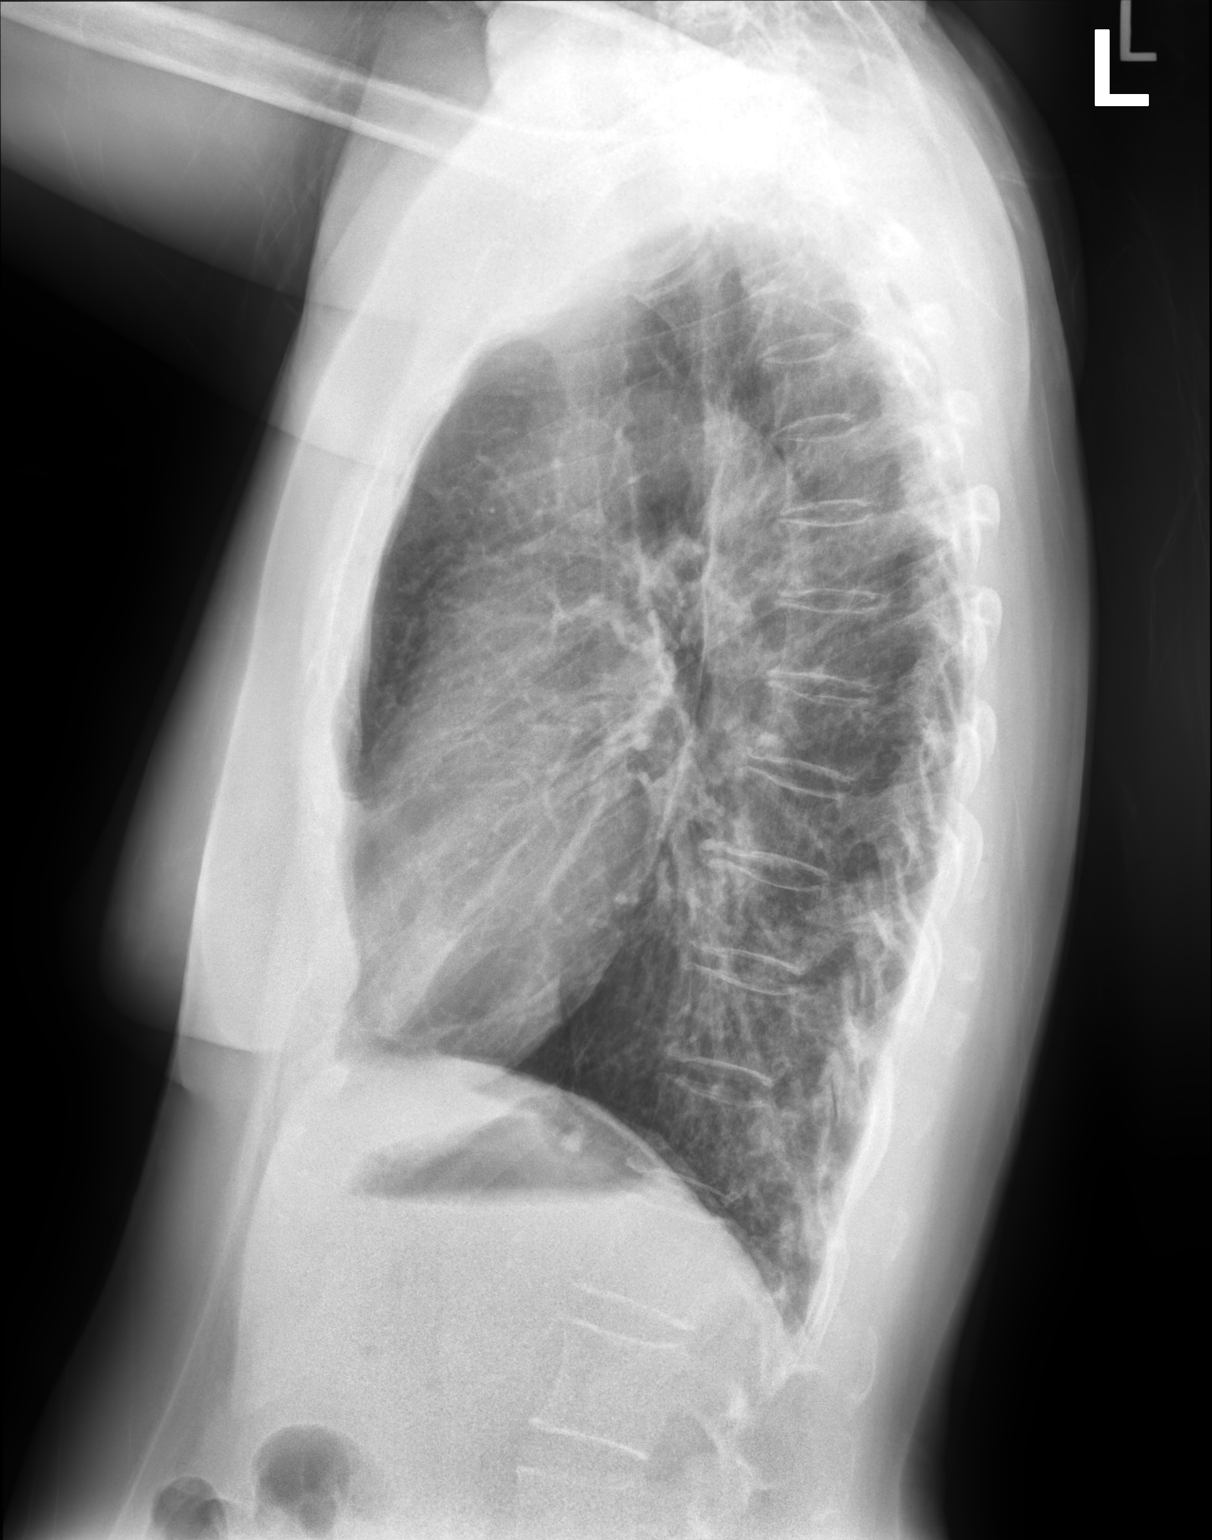

[2 of 2 positions shown; findings below may reference images not displayed]

FINDINGS: The heart size and mediastinal contours are within normal limits.
Chronic changes of medial right lung base is identified stable
compared to prior exam. Chronic change of the left lung base is
identified unchanged compared prior exams. There is no pulmonary
edema, focal infiltrate or pleural effusion. The visualized skeletal
structures are unremarkable.
IMPRESSION: No active cardiopulmonary disease. Chronic changes of bilateral lung
bases stable since [DATE].

## 2018-12-12 MED ORDER — AZITHROMYCIN 250 MG PO TABS: ORAL_TABLET | ORAL | 11 refills | Status: DC

## 2018-12-12 NOTE — Progress Notes (Signed)
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 no trouble at all but in the 1990s  developed rhinitis  On shots from [facility] and seemed to help p many years and did fine s needing any meds but developed cough around 2017 > [name] eval pos allergy to dust / ragweed and felt it was reflux rx but never took the meds then Jan 2018 cough got worse and dx with RLL air space dx better with abx but never gone and then again cough flared worse Jan 2019 so referred to pulmonary clinic 12/05/2017 by Dr Shaune Pollackonna [name]     History of Present Illness  12/05/2017  Initial office eval/ Wert on Prilosec 20 mg daily 30 min ac since Dec 2018  Chief Complaint  Patient presents with  . Pulmonary Consult    Referred by Dr. Kevan NyGates. Pt c/o cough off and on since she had PNA in Jan 2018- worse since Dec 2019.  Her cough is occ prod with clear to yellow sputum.    each time dx as pneumonia  mucus did change to darker but never cleared completely neither clinically nor radiographically  Dyspnea:  Not limited by breathing from desired activities   Cough: worse when leaning back to read at hs  Sleep: ok at 30 degrees / some fare in am of cough  rec Most likely diagnosis is Bronchiectasis = Bronchiectasis =   you have scarring of your bronchial tubes which means that they don't function perfectly normally and mucus tends to pool in certain areas of your lung which can cause pneumonia and further scarring of your lung and bronchial tubes Continue Priloscec 20 mg Take 30- 60 min before your first and last meals of the day  GERD diet  Please see patient coordinator before you leave today  to schedule a CT sinus and HRCT Chest CT  Please remember to go to the lab department downstairs in the basement  for your tests - we will call you with the results when they are available.    01/16/2018  f/u ov/Wert re: bronchiectasis  Chief Complaint  Patient  presents with  . Follow-up    Pt states she had been doing well since last visit up until this weekend, 8/24-8/25 and her cough was so bad she got to where she could not sleep. Pt states her throat became so raw from coughing and she also got to the point she even used cough drops. Pt states her cough was productive and was coughing up clear to green mucus and also had c/o wheezing.. Denies any SOB or CP/chest tightness.  Dyspnea:  MMRC1 = can walk nl pace, flat grade, can't hurry or go uphills or steps s sob   Cough: much worse x 48 h/ does not remember instructions on how to manage from last ov / given in writing/ starting to turn slightly purulent  Sleeping: typically sleeps fine / 30 degrees  SABA use: none  02: none   rec For cough >  mucinex dm 1200 mg every 12 hours with glass and use the flutter valve as much as possible For nasty mucus Take a zpak  GERD   You need Prevnar 13- check with Dr Kevan NyGates asap  Please schedule a follow up office visit in 6 weeks, call sooner if needed with pfts on return     02/28/2018  f/u  ov/Wert re: bornchiectasis s flare Chief Complaint  Patient presents with  . Follow-up    PFT scheduled for today, but unable to complete due to cough.  She reports that her cough has improved some since the last visit.   Dyspnea:  Not limited by breathing from desired activities   Cough: gone Sleeping: 30 degrees due to symptoms of obvious gerd at flatter angles  rec No change rx      06/12/2018  f/u ov/Wert re: s/p bronchiectasis flare - lots of questions re management  Chief Complaint  Patient presents with  . Follow-up    cough with little clear to light yellow mucus  Dyspnea:  Not limited by breathing from desired activities   Cough: worse since few weeks in Dec 2019 but started zpak May 18 2018 / am mucus turned clear / using flutter some but did not understand how/ when to use mucinex dm or whether could use if with zmax Sleeping: fine      12/12/2018   f/u ov/Wert re: bronchiectasis on zpak and ppi bid  Chief Complaint  Patient presents with  . Follow-up    Doing well and no new co's today.    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    No obvious day to day or daytime variability or assoc excess/ purulent sputum or mucus plugs or hemoptysis or cp or chest tightness, subjective wheeze or overt sinus or hb symptoms.   Sleeping as above  without nocturnal  or early am exacerbation  of respiratory  c/o's or need for noct saba. Also denies any obvious fluctuation of symptoms with weather or environmental changes or other aggravating or alleviating factors except as outlined above   No unusual exposure hx or h/o childhood pna/ asthma or knowledge of premature birth.  Current Allergies, Complete Past Medical History, Past Surgical History, Family History, and Social History were reviewed in [facility] electronic medical record.  ROS  The following are not active complaints unless bolded Hoarseness, sore throat, dysphagia, dental problems, itching, sneezing,  nasal congestion or discharge of excess mucus or purulent secretions, ear ache,   fever, chills, sweats, unintended wt loss or wt gain, classically pleuritic or exertional cp,  orthopnea pnd or arm/hand swelling  or leg swelling, presyncope, palpitations, abdominal pain, anorexia, nausea, vomiting, diarrhea  or change in bowel habits or change in bladder habits, change in stools or change in urine, dysuria, hematuria,  rash, arthralgias, visual complaints, headache, numbness, weakness or ataxia or problems with walking or coordination,  change in mood or  memory.        Current Meds  Medication Sig  . CALCIUM PO Take 600 mg by mouth 2 (two) times daily.   . Carboxymethylcellul-Glycerin (REFRESH OPTIVE OP)  Place 1 drop into both eyes 2 (two) times daily.  . clobetasol (TEMOVATE) 0.05 % external solution As needed  . hydrocortisone valerate cream (WESTCORT) 0.2 % Apply 1 application topically as needed.   . metroNIDAZOLE (METROCREAM) 0.75 % cream Apply 1 application topically as needed (for rosacea).   . MULTIPLE MINERALS PO Take 1 capsule by mouth daily.  Marland Kitchen. omeprazole (PRILOSEC) 20 MG capsule Take 20 mg by mouth 2 (two) times daily.   . potassium chloride (MICRO-K) 10 MEQ CR capsule Take 20  mEq by mouth 2 (two) times daily.   Marland Kitchen Respiratory Therapy Supplies (FLUTTER) DEVI 1 Device by Does not apply route as needed.  . triamterene-hydrochlorothiazide (DYAZIDE) 37.5-25 MG per capsule Take 1 capsule by mouth every morning.  . TURMERIC PO Take 1 capsule by mouth daily.               Objective:      amb verbose wf nad   12/12/2018        141  06/12/2018        143   02/28/2018      139   01/16/18 138 lb 9.6 oz (62.9 kg)  12/05/17 139 lb (63 kg)  07/17/14 141 lb (64 kg)    Vital signs reviewed - Note on arrival 02 sats  97% on RA    HEENT: nl dentition, turbinates bilaterally, and oropharynx. Nl external ear canals without cough reflex   NECK :  without JVD/Nodes/TM/ nl carotid upstrokes bilaterally   LUNGS: no acc muscle use,  Nl contour chest which is clear to A and P bilaterally without cough on insp or exp maneuvers   CV:  RRR  no s3 or murmur or increase in P2, and no edema   ABD:  soft and nontender with nl inspiratory excursion in the supine position. No bruits or organomegaly appreciated, bowel sounds nl  MS:  Nl gait/ ext warm without deformities, calf tenderness, cyanosis or clubbing No obvious joint restrictions   SKIN: warm and dry without lesions    NEURO:  alert, approp, nl sensorium with  no motor or cerebellar deficits apparent.         CXR PA and Lateral:   12/12/2018 :    I personally reviewed images and  impression as follows:   Mild hyperinflation with  non-specific markings         Assessment

## 2018-12-12 NOTE — Patient Instructions (Signed)
No change in medications   Please schedule a follow up visit in 12  months but call sooner if needed  

## 2018-12-12 NOTE — Assessment & Plan Note (Signed)
Onset cough 2017  Trial of ppi bid 12/05/2017  - Allergy profile 12/05/2017 >  Eos 0.2 /  IgE  695 RAST Pos ragweed/ dust  - Sinus CT 12/13/2017 > ok  - HRCT chest 12/13/2017 >>>  Moderate cylindrical and varicoid bronchiectasis scattered throughout both lungs involving all lung lobes, with associated patchy tree-in-bud opacities. Findings could be due to atypical mycobacterial infection (MAI) or recurrent aspiration. 2. Thick irregular bandlike focus of peribronchovascular consolidation in the basilar right lower lobe, favor evolving postinfectious/postinflammatory scarring.   - Alpha one Screen 12/05/17 :  MM  Level 165  - Quant Ig's 01/16/2018 > nl  - flutter valve training 01/16/2018   - Spirometry 02/28/2018  FEV1 1.35 (67%)  Ratio 67  With truncated upper portion of f/v loop -  12/2017 rx zpak cycles   She has done well over the last year with only 3 flares controlled with prn zpk and no clinical or radiographic evidence of dz progression   Reviewed again contigency instructions with approp use of mucinex, fltter valve and zpak as well as maint on ppi bid ac due to cyclical nature of cough/ reflux   > 50 % of 25 min ov spent on counseling re meds    F/u can be q 12 m, sooner if needed

## 2018-12-13 NOTE — Progress Notes (Signed)
Spoke with pt and notified of results per Dr. Wert. Pt verbalized understanding and denied any questions. 

## 2019-07-26 ENCOUNTER — Ambulatory Visit: Payer: Medicare PPO | Attending: Internal Medicine

## 2019-07-26 DIAGNOSIS — Z23 Encounter for immunization: Secondary | ICD-10-CM | POA: Insufficient documentation

## 2019-07-26 NOTE — Progress Notes (Signed)
   Covid-19 Vaccination Clinic  Name:  Connie Lindsey    MRN: [id number] DOB: Aug 30, 1945  07/26/2019  Ms. [name] was observed post Covid-19 immunization for 15 minutes without incident. She was provided with Vaccine Information Sheet and instruction to access the V-Safe system.   Ms. [name] was instructed to call 911 with any severe reactions post vaccine: Marland Kitchen Difficulty breathing  . Swelling of face and throat  . A fast heartbeat  . A bad rash all over body  . Dizziness and weakness   Immunizations Administered    Name Date Dose VIS Date Route   Pfizer COVID-19 Vaccine 07/26/2019 10:40 AM 0.3 mL 05/04/2019 Intramuscular   Manufacturer: [employer], [employer]   Lot: [id number]   NDC: [address]-9

## 2019-08-22 ENCOUNTER — Ambulatory Visit: Payer: Medicare PPO | Attending: Internal Medicine

## 2019-08-22 DIAGNOSIS — Z23 Encounter for immunization: Secondary | ICD-10-CM

## 2019-08-22 NOTE — Progress Notes (Signed)
   Covid-19 Vaccination Clinic  Name:  Connie Lindsey    MRN: [id number] DOB: [birth year]  08/22/2019  [name] was observed post Covid-19 immunization for 15 minutes without incident. She was provided with Vaccine Information Sheet and instruction to access the V-Safe system.   [name] was instructed to call 911 with any severe reactions post vaccine: Marland Kitchen Difficulty breathing  . Swelling of face and throat  . A fast heartbeat  . A bad rash all over body  . Dizziness and weakness   Immunizations Administered    Name Date Dose VIS Date Route   Pfizer COVID-19 Vaccine 08/22/2019 10:24 AM 0.3 mL 05/04/2019 Intramuscular   Manufacturer: [employer], [employer]   Lot: [id number]   NDC: [address]-8

## 2019-09-21 ENCOUNTER — Other Ambulatory Visit: Payer: Self-pay | Admitting: Family Medicine

## 2019-09-21 DIAGNOSIS — Z1231 Encounter for screening mammogram for malignant neoplasm of breast: Secondary | ICD-10-CM

## 2019-10-04 ENCOUNTER — Ambulatory Visit: Payer: Medicare PPO | Admitting: Pulmonary Disease

## 2019-10-04 ENCOUNTER — Other Ambulatory Visit: Payer: Self-pay

## 2019-10-04 ENCOUNTER — Ambulatory Visit (INDEPENDENT_AMBULATORY_CARE_PROVIDER_SITE_OTHER): Payer: Medicare PPO

## 2019-10-04 ENCOUNTER — Ambulatory Visit
Admission: RE | Admit: 2019-10-04 | Discharge: 2019-10-04 | Disposition: A | Discharge status: Home / Self Care | Payer: Medicare PPO | Source: Ambulatory Visit | Attending: Family Medicine | Admitting: Family Medicine

## 2019-10-04 ENCOUNTER — Encounter: Payer: Self-pay | Admitting: Pulmonary Disease

## 2019-10-04 VITALS — BP 128/86 | HR 86 | Temp 98.5°F | Ht 63.75 in | Wt 140.4 lb

## 2019-10-04 DIAGNOSIS — J479 Bronchiectasis, uncomplicated: Secondary | ICD-10-CM

## 2019-10-04 DIAGNOSIS — R918 Other nonspecific abnormal finding of lung field: Secondary | ICD-10-CM | POA: Diagnosis not present

## 2019-10-04 DIAGNOSIS — J449 Chronic obstructive pulmonary disease, unspecified: Secondary | ICD-10-CM

## 2019-10-04 DIAGNOSIS — Z1231 Encounter for screening mammogram for malignant neoplasm of breast: Secondary | ICD-10-CM

## 2019-10-04 IMAGING — MG DIGITAL SCREENING BILAT W/ TOMO W/ CAD
8 series · 9 of 24 positions shown · non-contrast
Comparison: Previous exam(s).

CLINICAL DATA: Screening.

EXAM:
DIGITAL SCREENING BILATERAL MAMMOGRAM WITH TOMO AND CAD

[R MLO synth-2D]
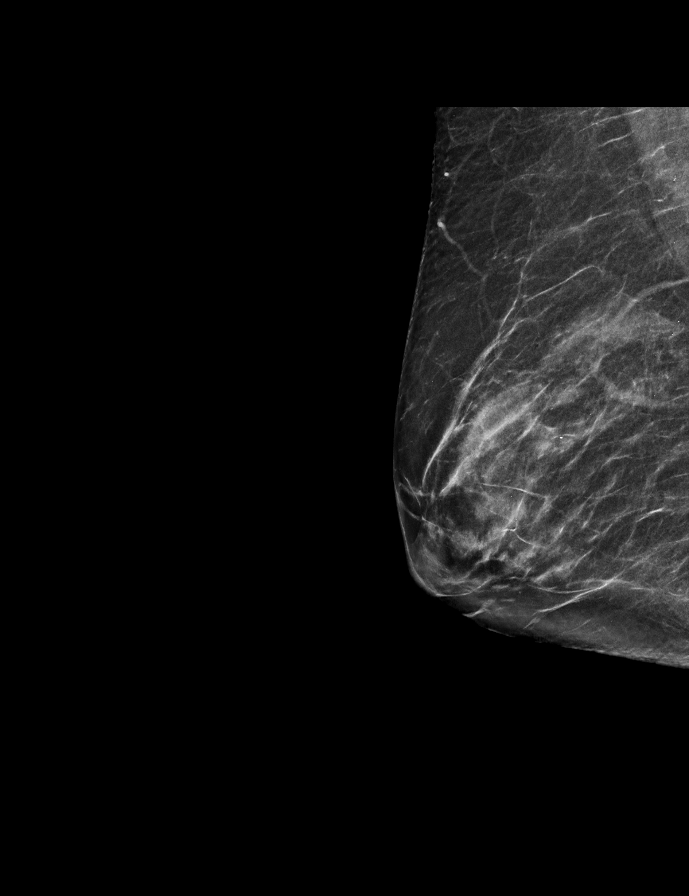

[L MLO synth-2D]
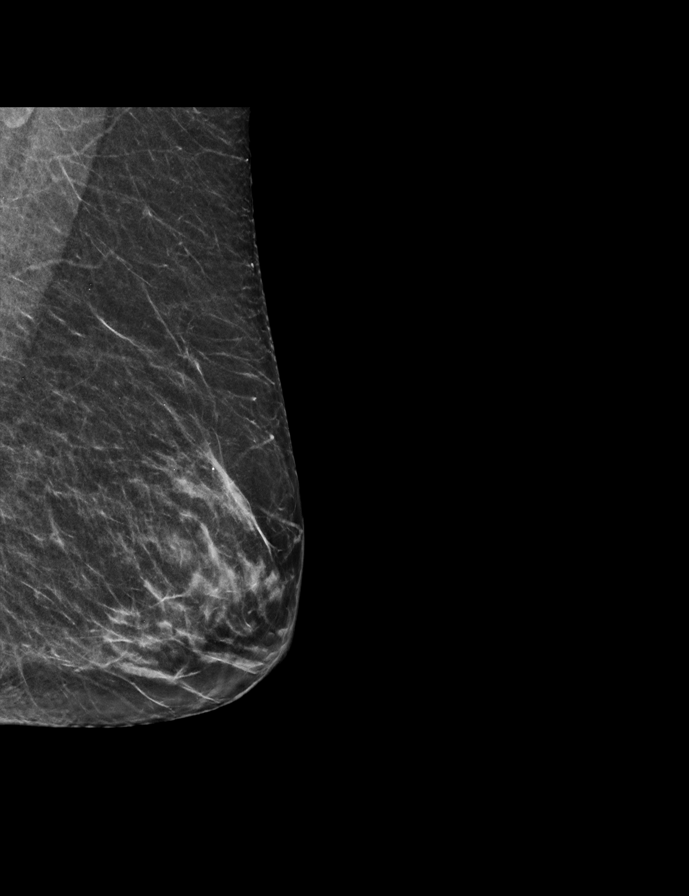

[L CC synth-2D]
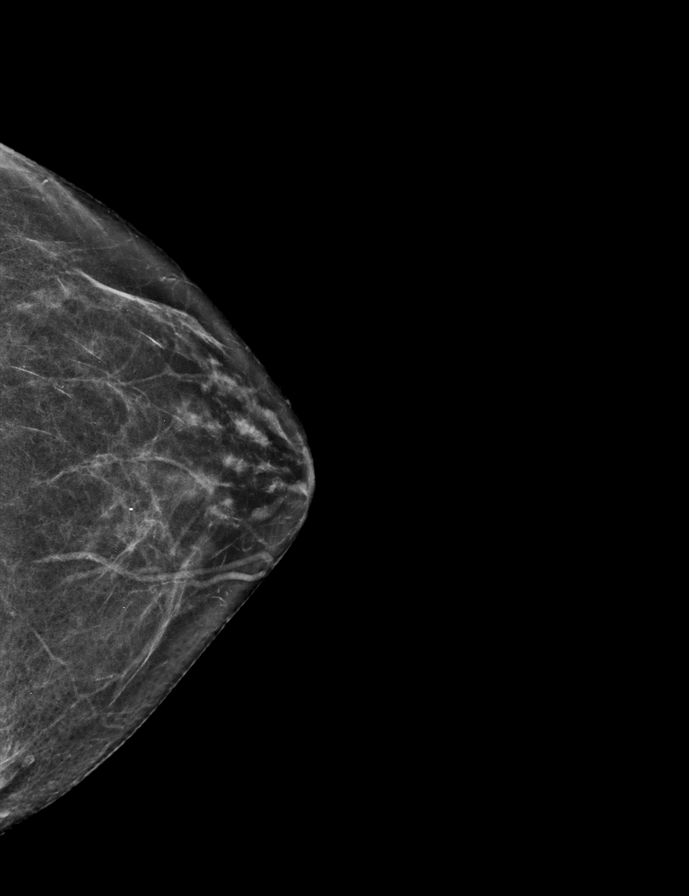

[R CC synth-2D]
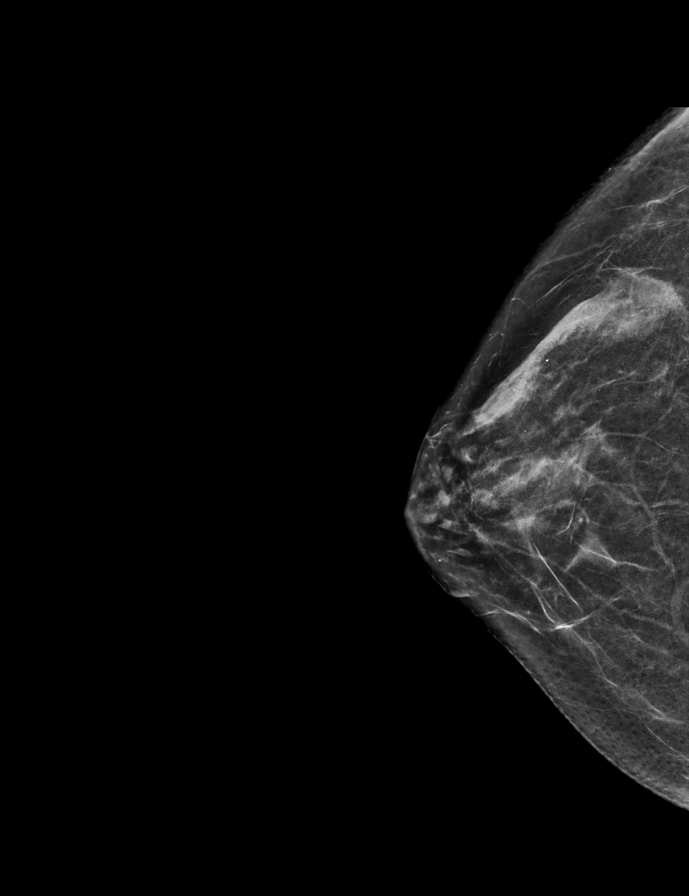

[L CC tomo · 2 of 52 frames shown]
[frame 17/52]
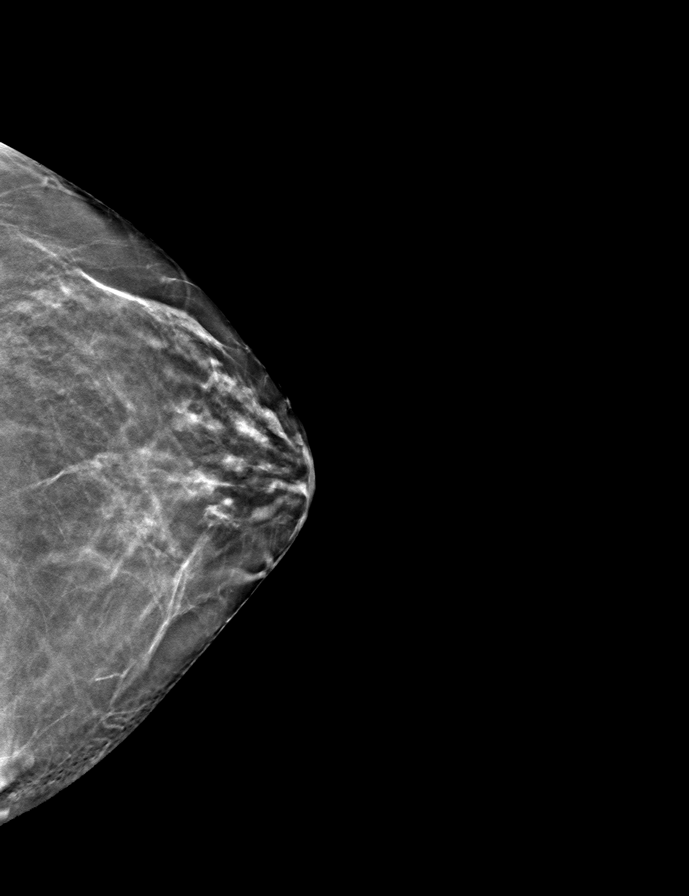
[frame 27/52]
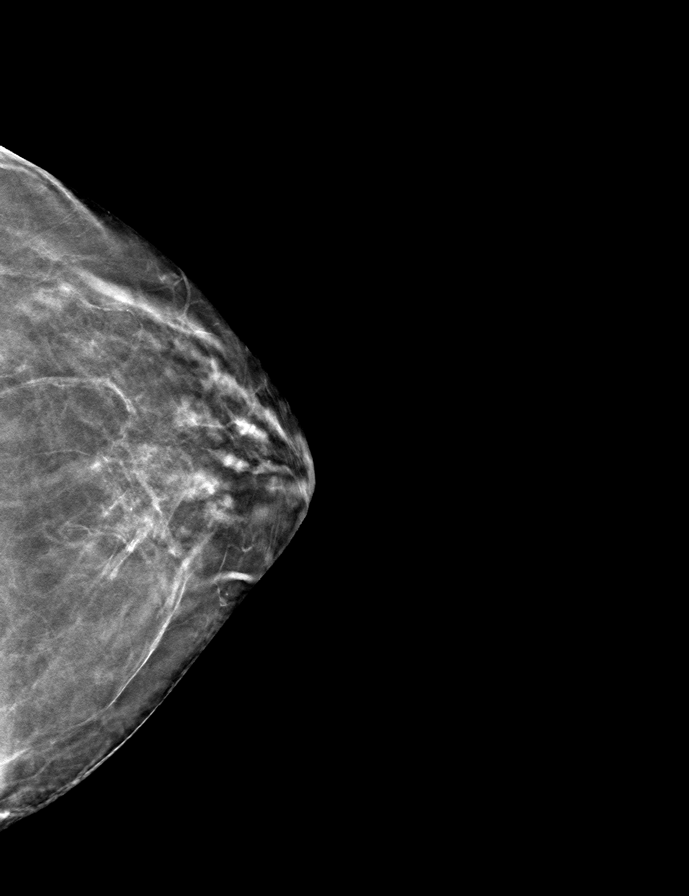

[R MLO tomo · tomo slice 29/57.0]
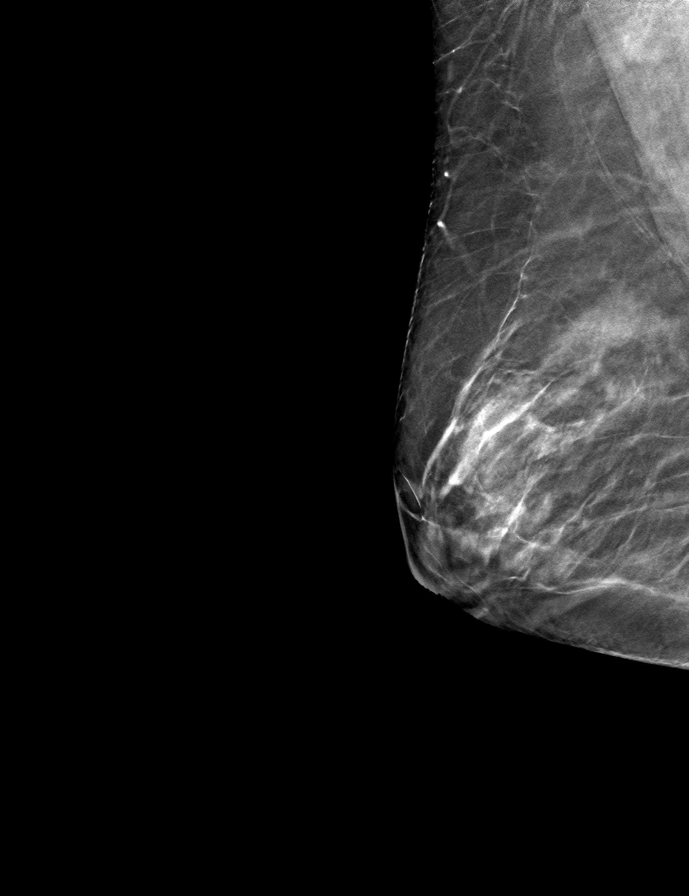

[L MLO tomo · tomo slice 27/54.0]
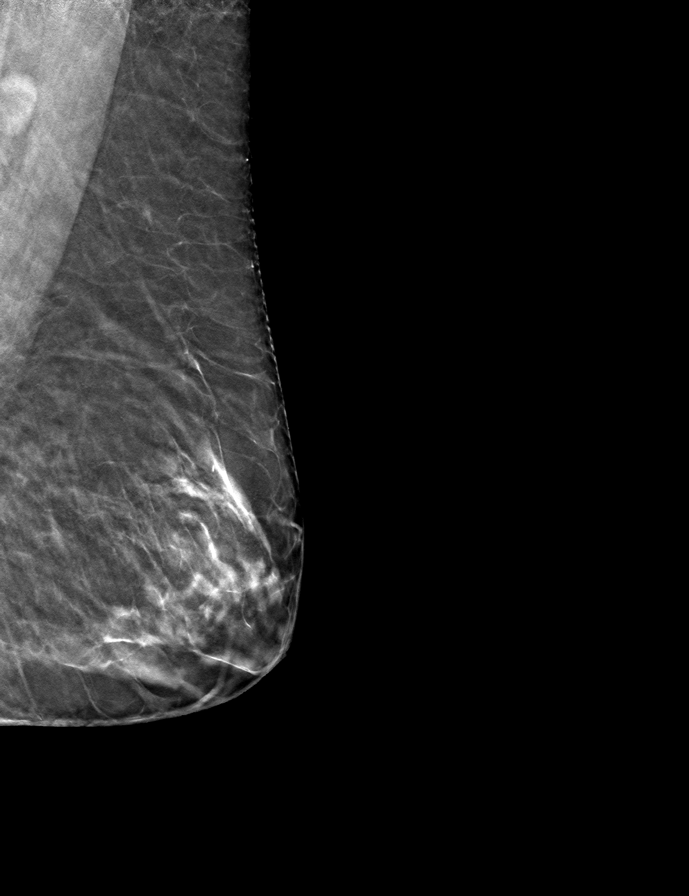

[R CC tomo · tomo slice 28/55.0]
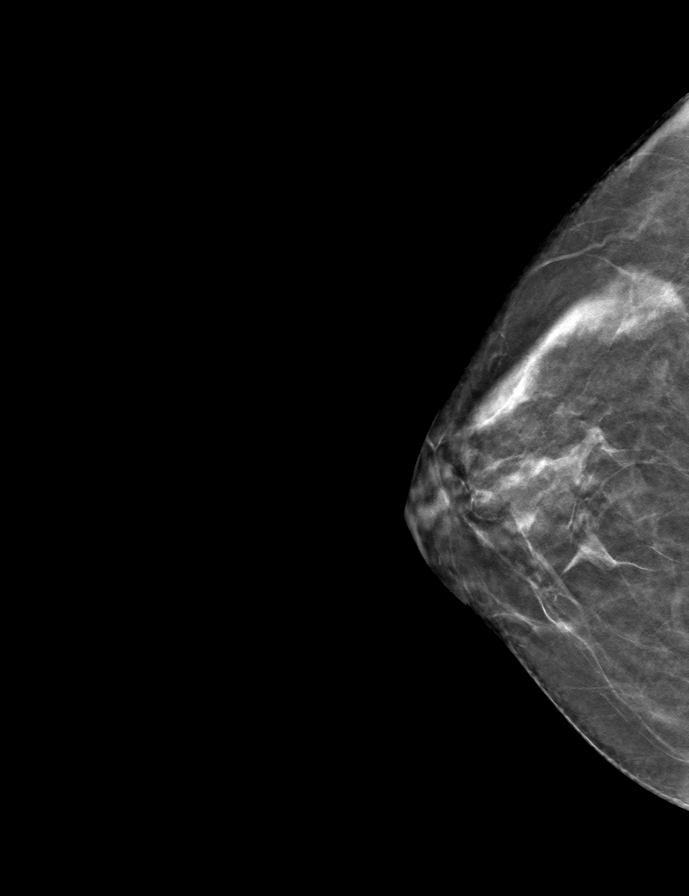

[9 of 24 positions shown; findings below may reference images not displayed]

ACR Breast Density Category c: The breast tissue is heterogeneously
dense, which may obscure small masses.
FINDINGS: There are no findings suspicious for malignancy. Images were
processed with CAD.
IMPRESSION: No mammographic evidence of malignancy. A result letter of this
screening mammogram will be mailed directly to the patient.

RECOMMENDATION:
Screening mammogram in one year. (Code:[5V])

BI-RADS CATEGORY  1: Negative.

## 2019-10-04 IMAGING — DX DG CHEST 2V
2 series · 2 of 2 positions shown · non-contrast
Comparison: [DATE], CT from [DATE]

CLINICAL DATA: Follow-up bronchiectasis

EXAM:
CHEST - 2 VIEW

[chest pa]
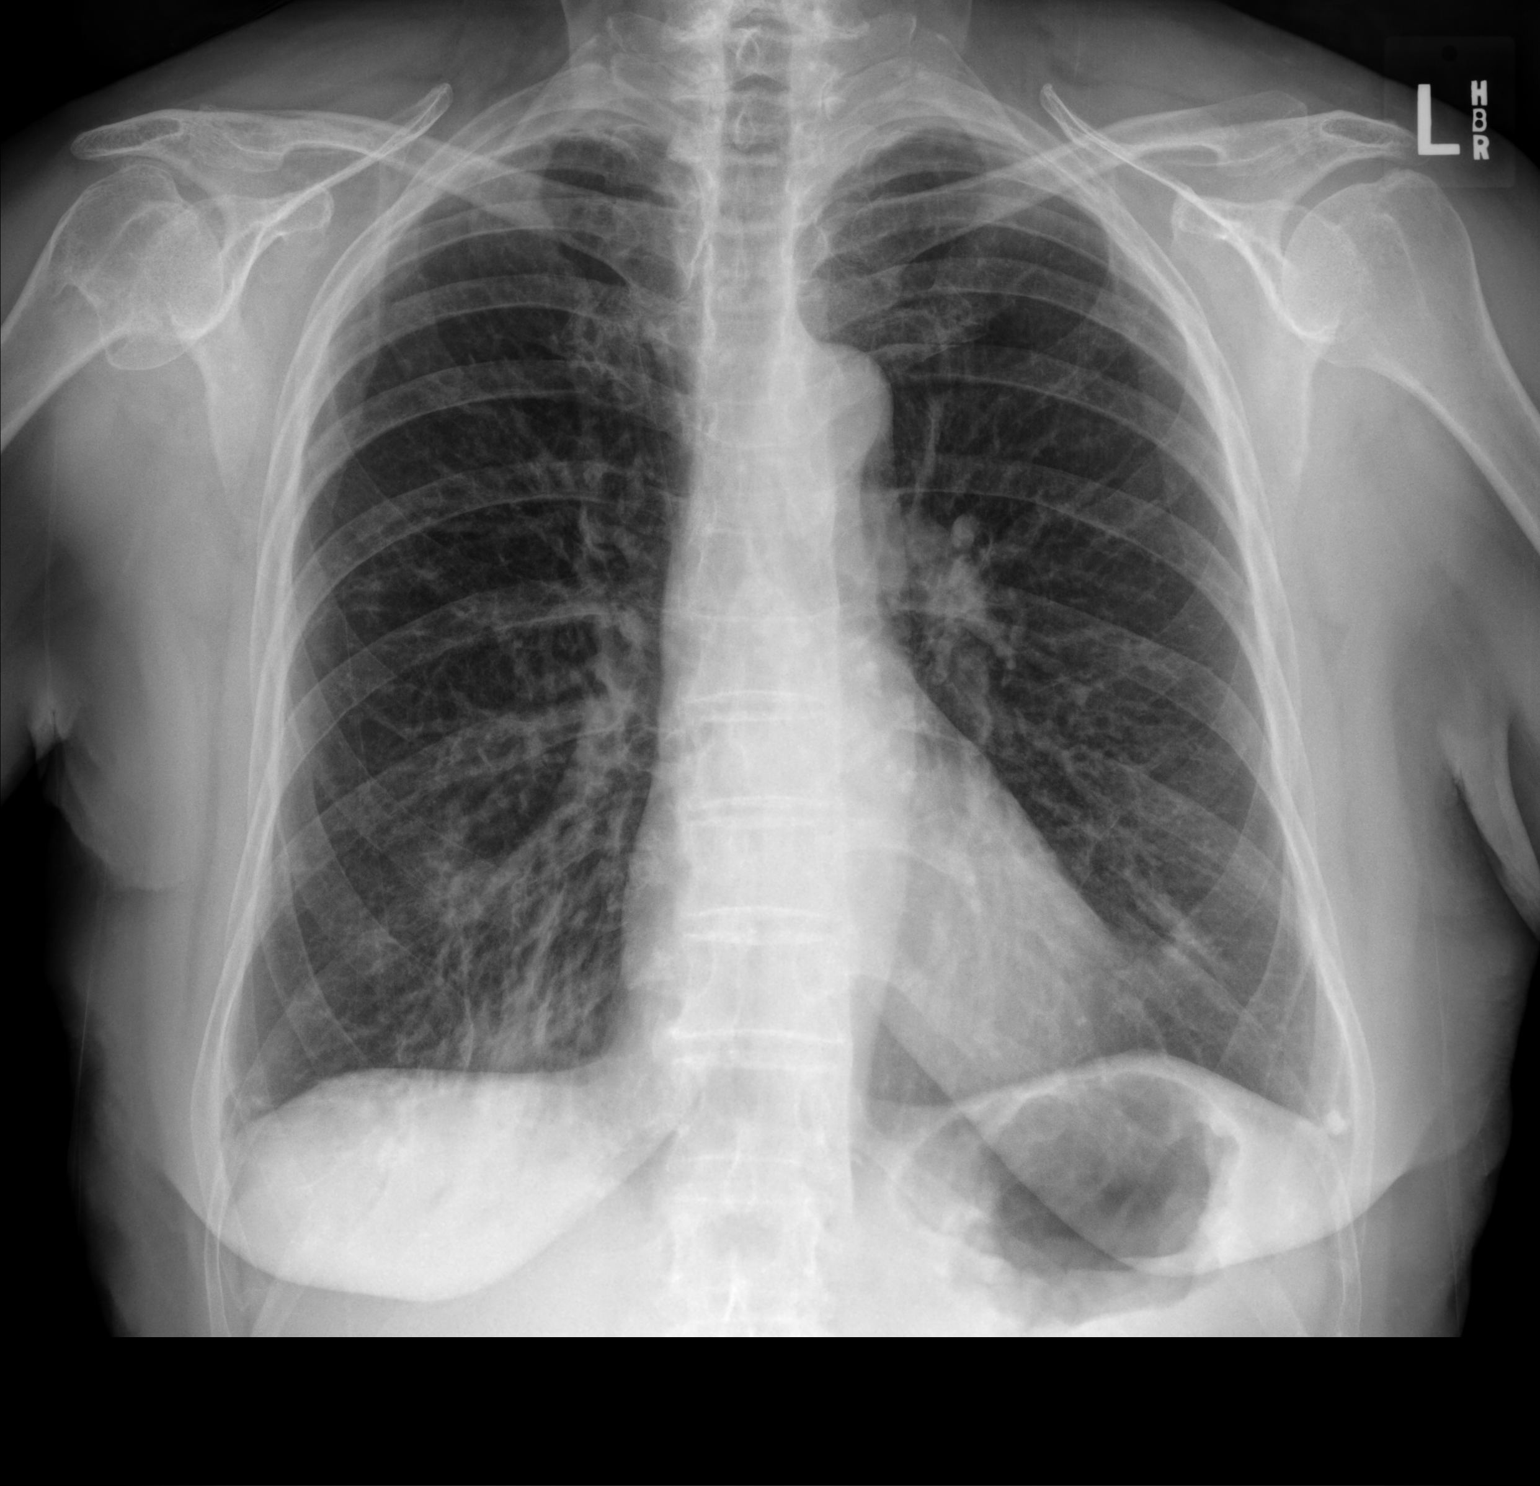

[chest lat]
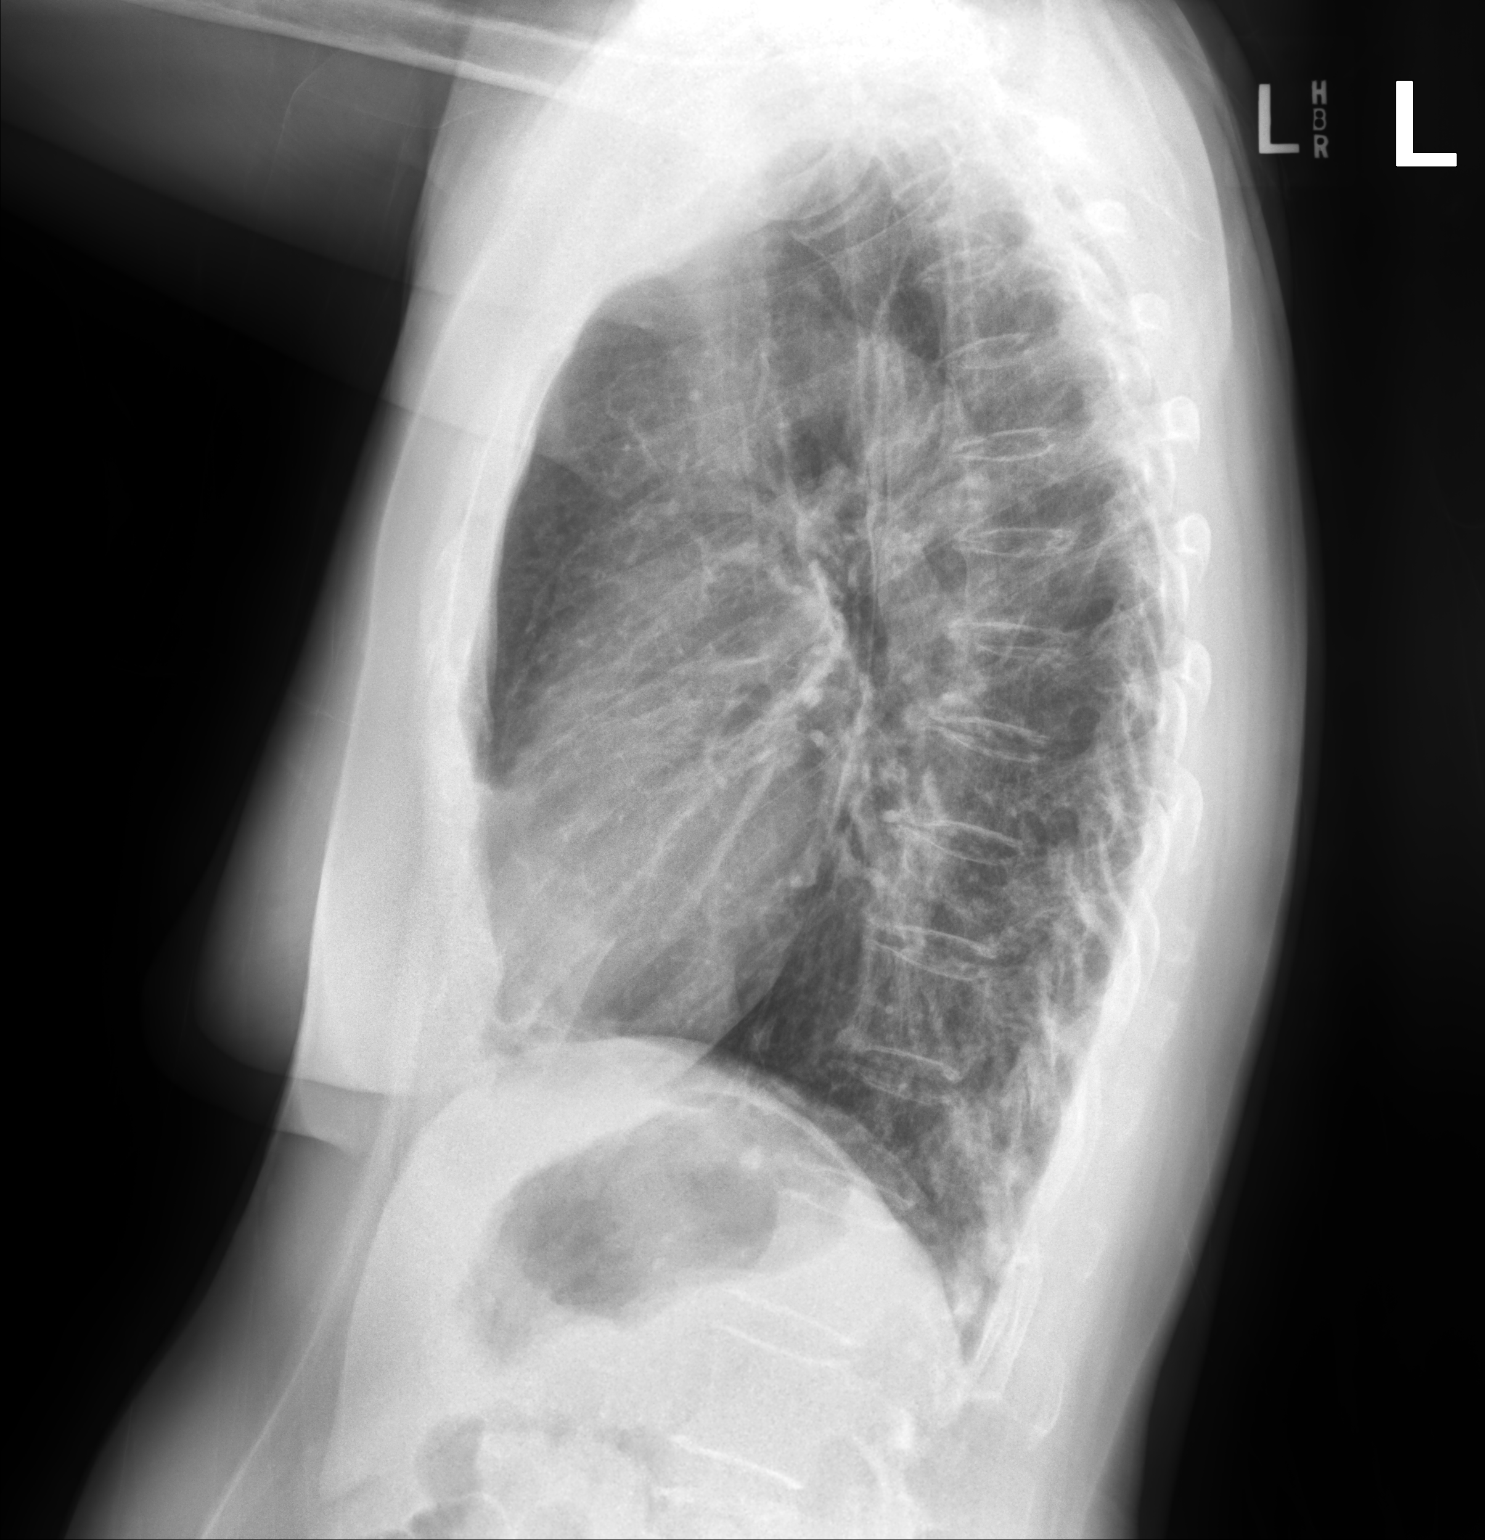

[2 of 2 positions shown; findings below may reference images not displayed]

FINDINGS: Cardiac shadow is stable. The lungs are hyperinflated but stable.
Persistent bronchiectatic changes are noted in the bases bilaterally
stable from previous exams. No focal infiltrate or sizable effusion
is seen. Calcified granuloma is noted in the left lung base. Bony
abnormality is seen.
IMPRESSION: Stable bronchiectatic changes in the bases similar to that seen on
prior exams. No acute abnormality noted.

## 2019-10-04 NOTE — Patient Instructions (Addendum)
You were seen today by Coral Ceo, NP  for:   1. Bronchiectasis without complication (HCC)  We will test your sputum: Respiratory sputum, AFB, fungal  Chest x-ray today  Do not take any more Z-Paks  Bronchiectasis: This is the medical term which indicates that you have damage, dilated airways making you more susceptible to respiratory infection. Use a flutter valve 10 breaths twice a day or 4 to 5 breaths 4-5 times a day to help clear mucus out Let us know if you have cough with change in mucus color or fevers or chills.  At that point you would need an antibiotic. Maintain a healthy nutritious diet, eating whole foods Take your medications as prescribed   Based off your sputum results as well as potentially your x-ray we may need to consider obtaining CT imaging of your chest   Follow Up:    Return in about 2 months (around 12/04/2019), or if symptoms worsen or fail to improve, for Follow up with Dr. Sherene Sires.   Please do your part to reduce the spread of COVID-19:      Reduce your risk of any infection  and COVID19 by using the similar precautions used for avoiding the common cold or flu:  Marland Kitchen Wash your hands often with soap and warm water for at least 20 seconds.  If soap and water are not readily available, use an alcohol-based hand sanitizer with at least 60% alcohol.  . If coughing or sneezing, cover your mouth and nose by coughing or sneezing into the elbow areas of your shirt or coat, into a tissue or into your sleeve (not your hands). Drinda Butts A MASK when in public  . Avoid shaking hands with others and consider head nods or verbal greetings only. . Avoid touching your eyes, nose, or mouth with unwashed hands.  . Avoid close contact with people who are sick. . Avoid places or events with large numbers of people in one location, like concerts or sporting events. . If you have some symptoms but not all symptoms, continue to monitor at home and seek medical attention if your  symptoms worsen. . If you are having a medical emergency, call 911.   ADDITIONAL HEALTHCARE OPTIONS FOR PATIENTS  [facility] Telehealth / e-Visit: [email or web address]         [facility]: [phone]  [facility]: [phone]                   [facility]: [phone]     It is flu season:   >>> Best ways to protect herself from the flu: Receive the yearly flu vaccine, practice good hand hygiene washing with soap and also using hand sanitizer when available, eat a nutritious meals, get adequate rest, hydrate appropriately   Please contact the office if your symptoms worsen or you have concerns that you are not improving.   Thank you for choosing [facility] for your healthcare, and for allowing [place] to partner with you on your healthcare journey. I am thankful to be able to provide care to you today.   Elisha Headland FNP-C    Bronchiectasis  Bronchiectasis is a condition in which the airways in the lungs (bronchi) are damaged and widened. The condition makes it hard for the lungs to get rid of mucus, and it causes mucus to gather in the bronchi. This condition often leads to lung infections, which can make the condition worse. What are the causes?  You can be born with this condition or you can develop it later in life. Common causes of this condition include:  Cystic fibrosis.  Repeated lung infections, such as pneumonia or tuberculosis.  An object or other blockage in the lungs.  Breathing in fluid, food, or other objects (aspiration).  A problem with the immune system and lung structure that is present at birth (congenital). Sometimes the cause is not known. What are the signs or symptoms? Common symptoms of this condition include:  A daily cough that brings up mucus and lasts for more than 3 weeks.  Lung infections that happen often.  Shortness of breath and  wheezing.  Weakness and fatigue. How is this diagnosed? This condition is diagnosed with tests, such as:  Chest X-rays or CT scans. These are done to check for changes in the lungs.  Breathing tests. These are done to check how well your lungs are working.  A test of a sample of your saliva (sputum culture). This test is done to check for infection.  Blood tests and other tests. These are done to check for related diseases or causes. How is this treated? Treatment for this condition depends on the severity of the illness and its cause. Treatment may include:  Medicines that loosen mucus so it can be coughed up (expectorants).  Medicines that relax the muscles of the bronchi (bronchodilators).  Antibiotic medicines to prevent or treat infection.  Physical therapy to help clear mucus from the lungs. Techniques may include: ? Postural drainage. This is when you sit or lie in certain positions so that mucus can drain by gravity. ? Chest percussion. This involves tapping the chest or back with a cupped hand. ? Chest vibration. For this therapy, a hand or special equipment vibrates your chest and back.  Surgery to remove the affected part of the lung. This may be done in severe cases. Follow these instructions at home: Medicines  Take over-the-counter and prescription medicines only as told by your health care provider.  If you were prescribed an antibiotic medicine, take it as told by your health care provider. Do not stop taking the antibiotic even if you start to feel better.  Avoid taking sedatives and antihistamines unless your health care provider tells you to take them. These medicines tend to thicken the mucus in the lungs. Managing symptoms  Perform breathing exercises or techniques to clear your lungs as told by your health care provider.  Consider using a cold steam vaporizer or humidifier in your room or home to help loosen secretions.  If you have a cough that gets  worse at night, try sleeping in a semi-upright position. General instructions  Get plenty of rest.  Drink enough fluid to keep your urine clear or pale yellow.  Stay inside when pollution and ozone levels are high.  Stay up to date with vaccinations and immunizations.  Avoid cigarette smoke and other lung irritants.  Do not use any products that contain nicotine or tobacco, such as cigarettes and e-cigarettes. If you need help quitting, ask your health care provider.  Keep all follow-up visits as told by your health care provider. This is important. Contact a health care provider if:  You cough up more sputum than before and the sputum is yellow or green in color.  You have a fever.  You cannot control your cough and are losing sleep. Get help right away if:  You cough up blood.  You have chest pain.  You have increasing  shortness of breath.  You have pain that gets worse or is not controlled with medicines.  You have a fever and your symptoms suddenly get worse. Summary  Bronchiectasis is a condition in which the airways in the lungs (bronchi) are damaged and widened. The condition makes it hard for the lungs to get rid of mucus, and it causes mucus to gather in the bronchi.  Treatment usually includes therapy to help clear mucus from the lungs.  Stay up to date with vaccinations and immunizations. This information is not intended to replace advice given to you by your health care provider. Make sure you discuss any questions you have with your health care provider. Document Revised: 04/22/2017 Document Reviewed: 06/14/2016 Elsevier Patient Education  2020 [employer].

## 2019-10-04 NOTE — Assessment & Plan Note (Signed)
Patient with moderate cylindrical varicoid bronchiectasis scattered throughout both lungs Significant patient confusion today on how to use flutter valve Patient is unsure how to use azithromycin No sputum cultures on file No fevers today  Plan: Educated patient on how to use flutter valve, encourage use Patient demonstrated flutter valve use in office Chest x-ray today No more azithromycin Sputum cultures to be obtained 6-8 week follow-up with Dr. Sherene Sires May need to consider repeat high-resolution CT imaging based on the chest x-ray

## 2019-10-04 NOTE — Assessment & Plan Note (Signed)
Obstruction seen on last pulmonary function testing May need to consider trial of inhaler therapies, would favor LABA, LAMA due to history of bronchiectasis

## 2019-10-04 NOTE — Progress Notes (Signed)
@Patient  ID: , female    DOB: Apr 01, 1946, 74 y.o.   MRN: [id number]  Chief Complaint  Patient presents with  . Follow-up    F/U for bronchiectasis flare up. Has been coughing up more yellow phlegm for the past few months. Has also been wheezing, has not done this before. Has been taking a zpak,.     Referring provider: No ref. provider found  HPI:  74 year old female former smoker followed in our office for bronchiectasis  PMH: Allergic rhinitis, GERD Smoker/ Smoking History: Former smoker Maintenance:  none Pt of: Dr. 61  10/04/2019  - Visit   74 year old female former smoker presenting to our office as a follow-up.  She is followed here by Dr. 61 for bronchiectasis.  She was last seen in July/2020.  At last office visit in July/2020 patient reported that her breathing was stable.  Since then breathing has worsened.  She reports at last office visit she was given Z-Paks which she could refill.  Since last being seen she has taken approximately six of them.  She continues to have flares of coughing with yellow mucus.  She has no sputum samples on file.  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  Patient denies any history of recurrent sinus infections.  She has been seen by an allergist before but only found to have elevations of ragweed as well as dust mites.  Patient does have an elevated IgE on 2019 lab work.  Patient has a flutter valve but does not know how to use it.  Questionaires / Pulmonary Flowsheets:   MMRC: mMRC Dyspnea Scale mMRC Score  10/04/2019 0   Tests:   02/28/2018-pulmonary function test-FVC 2.01 (75% predicted), ratio 67, FEV1 1.35 (67% predicted)  12/13/2017-CT chest high-res-moderate cylindrical varicoid bronchiectasis scattered throughout both lungs involving all lung lobes with associated patchy tree-in-bud opacities, findings could be due to atypical micro bacterial infection (MAI) or recurrent exacerbation or  recurrent aspiration, thick irregular bandlike focus of peribronchovascular consolidation of the basilar right lower lobe favoring evolving postinfectious/postinflammatory scarring, recommend attention on follow-up chest CT in 3 months, mild patchy air trapping in both lungs indicative of small airways disease  12/13/2017-CT maxillofacial-screening exam demonstrates minimal mucosal thickening of the right maxillary sinus without other significant paranasal sinus disease  12/05/2017-respiratory allergy panel-elevations with dust mites, ragweed, IgE 695 12/05/2017-alpha-1 antitrypsin-165, PI*MM 12/05/2017-CBC with differential-eosinophils relative 2.5, eosinophils absolute 0.2 01/16/2018-IgG, IgA, IgM-normal, IgM 1755  FENO:  No results found for: NITRICOXIDE  PFT: PFT Results Latest Ref Rng & Units 02/28/2018  FVC-Pre L 2.01  FVC-Predicted Pre % 75  Pre FEV1/FVC % % 67  FEV1-Pre L 1.35  FEV1-Predicted Pre % 67    WALK:  No flowsheet data found.  Imaging: MM 3D SCREEN BREAST BILATERAL  Result Date: 10/04/2019 CLINICAL DATA:  Screening. EXAM: DIGITAL SCREENING BILATERAL MAMMOGRAM WITH TOMO AND CAD COMPARISON:  Previous exam(s). ACR Breast Density Category c: The breast tissue is heterogeneously dense, which may obscure small masses. FINDINGS: There are no findings suspicious for malignancy. Images were processed with CAD. IMPRESSION: No mammographic evidence of malignancy. A result letter of this screening mammogram will be mailed directly to the patient. RECOMMENDATION: Screening mammogram in one year. (Code:SM-B-01Y) BI-RADS CATEGORY  1: Negative. Electronically Signed   By: 10/06/2019 M.D.   On: 10/04/2019 16:00    Lab Results:  CBC    Component Value Date/Time   WBC 6.8 12/05/2017 1137   RBC 4.40 12/05/2017  1137   HGB 14.1 12/05/2017 1137   HCT 39.4 12/05/2017 1137   PLT 395.0 12/05/2017 1137   MCV 89.6 12/05/2017 1137   MCH 31.7 07/19/2014 0500   MCHC 35.8 12/05/2017 1137    RDW 13.9 12/05/2017 1137   LYMPHSABS 1.6 12/05/2017 1137   MONOABS 1.1 (H) 12/05/2017 1137   EOSABS 0.2 12/05/2017 1137   BASOSABS 0.0 12/05/2017 1137    BMET    Component Value Date/Time   NA 133 (L) 07/19/2014 0500   K 3.4 (L) 07/19/2014 0500   CL 100 07/19/2014 0500   CO2 27 07/19/2014 0500   GLUCOSE 127 (H) 07/19/2014 0500   BUN 11 07/19/2014 0500   CREATININE 0.59 07/19/2014 0500   CALCIUM 8.4 07/19/2014 0500   GFRNONAA >90 07/19/2014 0500   GFRAA >90 07/19/2014 0500    BNP No results found for: BNP  ProBNP No results found for: PROBNP  Specialty Problems      Pulmonary Problems   Bronchiectasis without complication (HCC)    Onset cough 2017  Trial of ppi bid 12/05/2017  - Allergy profile 12/05/2017 >  Eos 0.2 /  IgE  695 RAST Pos ragweed/ dust  - Sinus CT 12/13/2017 > ok  - HRCT chest 12/13/2017 >>>  Moderate cylindrical and varicoid bronchiectasis scattered throughout both lungs involving all lung lobes, with associated patchy tree-in-bud opacities. Findings could be due to atypical mycobacterial infection (MAI) or recurrent aspiration. 2. Thick irregular bandlike focus of peribronchovascular consolidation in the basilar right lower lobe, favor evolving postinfectious/postinflammatory scarring.   - Alpha one Screen 12/05/17 :  MM  Level 165  - Quant Ig's 01/16/2018 > nl  - flutter valve training 01/16/2018  - Spirometry 02/28/2018  FEV1 1.35 (67%)  Ratio 67  With truncated upper portion of f/v loop -  12/2017 rx zpak cycles        Obstructive lung disease (HCC)      Allergies  Allergen Reactions  . Benadryl [Diphenhydramine] Nausea Only  . Codeine Nausea Only  . Penicillins Nausea Only  . Sulfa Antibiotics Nausea Only    Immunization History  Administered Date(s) Administered  . PFIZER SARS-COV-2 Vaccination 07/26/2019, 08/22/2019    Past Medical History:  Diagnosis Date  . Arthritis   . Family history of adverse reaction to anesthesia    father-  had fall- hip surgery - memory loss then had pneumonia   . GERD (gastroesophageal reflux disease)   . Hypertension     Tobacco History: Social History   Tobacco Use  Smoking Status Former Smoker  . Packs/day: 0.25  . Years: 15.00  . Pack years: 3.75  . Types: Cigarettes  . Quit date: 05/24/1980  . Years since quitting: 39.3  Smokeless Tobacco Never Used   Counseling given: Not Answered   Continue to not smoke  Outpatient Encounter Medications as of 10/04/2019  Medication Sig  . azithromycin (ZITHROMAX) 250 MG tablet Take 2 on day one then 1 daily x 4 days  . CALCIUM PO Take 600 mg by mouth 2 (two) times daily.   . Carboxymethylcellul-Glycerin (REFRESH OPTIVE OP) Place 1 drop into both eyes 2 (two) times daily.  . clobetasol (TEMOVATE) 0.05 % external solution As needed  . hydrocortisone valerate cream (WESTCORT) 0.2 % Apply 1 application topically as needed.   . metroNIDAZOLE (METROCREAM) 0.75 % cream Apply 1 application topically as needed (for rosacea).   . MULTIPLE MINERALS PO Take 1 capsule by mouth daily.  Marland Kitchen omeprazole (PRILOSEC) 20  MG capsule Take 20 mg by mouth 2 (two) times daily.   . potassium chloride (MICRO-K) 10 MEQ CR capsule Take 20 mEq by mouth 2 (two) times daily.   Marland Kitchen Respiratory Therapy Supplies (FLUTTER) DEVI 1 Device by Does not apply route as needed.  . triamterene-hydrochlorothiazide (DYAZIDE) 37.5-25 MG per capsule Take 1 capsule by mouth every morning.  . TURMERIC PO Take 1 capsule by mouth daily.   No facility-administered encounter medications on file as of 10/04/2019.     Review of Systems  Review of Systems  Constitutional: Positive for fatigue. Negative for activity change and fever.  HENT: Negative for sinus pressure, sinus pain and sore throat.   Respiratory: Positive for cough. Negative for shortness of breath and wheezing.   Cardiovascular: Negative for chest pain and palpitations.  Gastrointestinal: Negative for diarrhea, nausea and  vomiting.  Musculoskeletal: Negative for arthralgias.  Neurological: Negative for dizziness.  Psychiatric/Behavioral: Negative for sleep disturbance. The patient is not nervous/anxious.      Physical Exam  BP 128/86 (BP Location: Left Arm, Patient Position: Sitting, Cuff Size: Normal)   Pulse 86   Temp 98.5 F (36.9 C) (Temporal)   Ht 5' 3.75" (1.619 m)   Wt 140 lb 6.4 oz (63.7 kg)   SpO2 100% Comment: on RA  BMI 24.29 kg/m   Wt Readings from Last 5 Encounters:  10/04/19 140 lb 6.4 oz (63.7 kg)  12/12/18 141 lb (64 kg)  06/12/18 143 lb 9.6 oz (65.1 kg)  02/28/18 139 lb (63 kg)  01/16/18 138 lb 9.6 oz (62.9 kg)    BMI Readings from Last 5 Encounters:  10/04/19 24.29 kg/m  12/12/18 24.39 kg/m  06/12/18 24.84 kg/m  02/28/18 25.02 kg/m  01/16/18 23.79 kg/m     Physical Exam Vitals and nursing note reviewed.  Constitutional:      General: She is not in acute distress.    Appearance: Normal appearance. She is normal weight.  HENT:     Head: Normocephalic and atraumatic.     Right Ear: Tympanic membrane, ear canal and external ear normal. There is no impacted cerumen.     Left Ear: Tympanic membrane, ear canal and external ear normal. There is no impacted cerumen.     Ears:     Comments: Hearing aids bilaterally    Nose: Nose normal. No congestion.     Mouth/Throat:     Mouth: Mucous membranes are moist.     Pharynx: Oropharynx is clear.  Eyes:     Pupils: Pupils are equal, round, and reactive to light.  Cardiovascular:     Rate and Rhythm: Normal rate and regular rhythm.     Pulses: Normal pulses.     Heart sounds: Normal heart sounds. No murmur.  Pulmonary:     Effort: Pulmonary effort is normal. No respiratory distress.     Breath sounds: No decreased air movement. No decreased breath sounds, wheezing or rales.     Comments: Scattered squeaks Musculoskeletal:     Cervical back: Normal range of motion.  Skin:    General: Skin is warm and dry.      Capillary Refill: Capillary refill takes less than 2 seconds.  Neurological:     General: No focal deficit present.     Mental Status: She is alert and oriented to person, place, and time. Mental status is at baseline.     Gait: Gait normal.  Psychiatric:        Mood and Affect: Mood normal.  Behavior: Behavior normal.        Thought Content: Thought content normal.        Judgment: Judgment normal.       Assessment & Plan:   Discussion: Considerable amount of time spent with patient reviewing bronchiectasis, monitoring for symptoms, appropriate use of antibiotics.  I reviewed with patient that likely the azithromycin was intended to be used as suppressive antibiotics.  We will obtain sputum cultures today.  We will obtain chest x-ray.  We have instructed the patient how to use a flutter valve and I have encouraged her to resume using it twice a day 10 breaths each time.  We will keep a close follow-up with the patient as well.  Patient knows to call our office with any concerns or worsen symptoms.  Bronchiectasis without complication ([place]) Patient with moderate cylindrical varicoid bronchiectasis scattered throughout both lungs Significant patient confusion today on how to use flutter valve Patient is unsure how to use azithromycin No sputum cultures on file No fevers today  Plan: Educated patient on how to use flutter valve, encourage use Patient demonstrated flutter valve use in office Chest x-ray today No more azithromycin Sputum cultures to be obtained 6-8 week follow-up with Dr. 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 Melvyn Novas.   Lauraine Rinne, NP 10/04/2019   This appointment required 45 minutes of patient care (this includes precharting, chart review, review of results, face-to-face care, etc.).

## 2019-10-04 NOTE — Assessment & Plan Note (Signed)
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

## 2019-10-26 DIAGNOSIS — Z471 Aftercare following joint replacement surgery: Secondary | ICD-10-CM | POA: Diagnosis not present

## 2019-10-26 DIAGNOSIS — Z96642 Presence of left artificial hip joint: Secondary | ICD-10-CM | POA: Diagnosis not present

## 2019-10-26 DIAGNOSIS — M1611 Unilateral primary osteoarthritis, right hip: Secondary | ICD-10-CM | POA: Diagnosis not present

## 2019-12-06 ENCOUNTER — Encounter: Payer: Self-pay | Admitting: Internal Medicine

## 2019-12-06 ENCOUNTER — Other Ambulatory Visit: Payer: Self-pay

## 2019-12-06 ENCOUNTER — Ambulatory Visit: Payer: Medicare PPO | Admitting: Internal Medicine

## 2019-12-06 DIAGNOSIS — J479 Bronchiectasis, uncomplicated: Secondary | ICD-10-CM | POA: Diagnosis not present

## 2019-12-06 NOTE — Patient Instructions (Addendum)
When there is a change in your mucus more nasty or bloody please bring [place] a sample in   Keep using the flutter valve as much as you can especially with coughing with mucinex up to 1200mg  every 12 hours as needed   Please schedule a follow up visit in 6  months but call sooner if needed

## 2019-12-06 NOTE — Progress Notes (Signed)
Connie Lindsey, female    DOB: 08/16/1945,    MRN: [id number]    Brief patient profile:  84 yowf quit smoking 1982 due to IUP no trouble at all but in the 1990s  developed rhinitis  On shots from [facility] and seemed to help p many years and did fine s needing any meds but developed cough around 2017 > [name] eval pos allergy to dust / ragweed and felt it was reflux rx but never took the meds then Jan 2018 cough got worse and dx with RLL air space dx better with abx but never gone and then again cough flared worse Jan 2019 so referred to pulmonary clinic 12/05/2017 by Dr Shaune Pollack     History of Present Illness  12/05/2017  Initial office eval/ Kajol Crispen on Prilosec 20 mg daily 30 min ac since Dec 2018  Chief Complaint  Patient presents with   Pulmonary Consult    Referred by Dr. Kevan Ny. Pt c/o cough off and on since she had PNA in Jan 2018- worse since Dec 2019.  Her cough is occ prod with clear to yellow sputum.    each time dx as pneumonia  mucus did change to darker but never cleared completely neither clinically nor radiographically  Dyspnea:  Not limited by breathing from desired activities   Cough: worse when leaning back to read at hs  Sleep: ok at 30 degrees / some fare in am of cough  rec Most likely diagnosis is Bronchiectasis = Bronchiectasis =   you have scarring of your bronchial tubes which means that they don't function perfectly normally and mucus tends to pool in certain areas of your lung which can cause pneumonia and further scarring of your lung and bronchial tubes Continue Priloscec 20 mg Take 30- 60 min before your first and last meals of the day  GERD diet  Please see patient coordinator before you leave today  to schedule a CT sinus and HRCT Chest CT  Please remember to go to the lab department downstairs in the basement  for your tests - we will call you with the results when they are available.    01/16/2018  f/u ov/Denaly Gatling re: bronchiectasis  Chief Complaint  Patient  presents with   Follow-up    Pt states she had been doing well since last visit up until this weekend, 8/24-8/25 and her cough was so bad she got to where she could not sleep. Pt states her throat became so raw from coughing and she also got to the point she even used cough drops. Pt states her cough was productive and was coughing up clear to green mucus and also had c/o wheezing.. Denies any SOB or CP/chest tightness.  Dyspnea:  MMRC1 = can walk nl pace, flat grade, can't hurry or go uphills or steps s sob   Cough: much worse x 48 h/ does not remember instructions on how to manage from last ov / given in writing/ starting to turn slightly purulent  Sleeping: typically sleeps fine / 30 degrees  SABA use: none  02: none   rec For cough >  mucinex dm 1200 mg every 12 hours with glass and use the flutter valve as much as possible For nasty mucus Take a zpak  GERD  You need Prevnar 13- check with Dr Kevan Ny asap  Please schedule a follow up office visit in 6 weeks, call sooner if needed with pfts on return       06/12/2018  f/u ov/Claudio Mondry re: s/p bronchiectasis flare - lots of questions re management  Chief Complaint  Patient presents with   Follow-up    cough with little clear to light yellow mucus  Dyspnea:  Not limited by breathing from desired activities   Cough: worse since few weeks in Dec 2019 but started zpak May 18 2018 / am mucus turned clear / using flutter some but did not understand how/ when to use mucinex dm or whether could use if with zmax Sleeping: fine      12/12/2018  f/u ov/Lahoma Constantin re: bronchiectasis on zpak and ppi bid  Chief Complaint  Patient presents with   Follow-up    Doing well and no new co's today.    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  rec No change rx   NP  eval 10/04/19  unable to use flutter appropriately and zmax not effective  rec sputum sample > not obtained as rec    12/06/2019  f/u ov/Jericca Russett re: bronchiectasis with baseline mild cough/ min purulent on no maint rx  Chief Complaint  Patient presents with   Follow-up    No complaints  Dyspnea:  Not limited by breathing from desired activities   Cough:  More in afternoon   Sleeping: flat bed/ several pillows  SABA use: none  02: none    No obvious day to day or daytime variability or assoc mucus plugs or hemoptysis or cp or chest tightness, subjective wheeze or overt sinus or hb symptoms.   Sleeping  without nocturnal  or early am exacerbation  of respiratory  c/o's or need for noct saba. Also denies any obvious fluctuation of symptoms with weather or environmental changes or other aggravating or alleviating factors except as outlined above   No unusual exposure hx or h/o childhood pna/ asthma or knowledge of premature birth.  Current Allergies, Complete Past Medical History, Past Surgical History, Family History, and Social History were reviewed in [employer] record.  ROS  The following are not active complaints unless bolded Hoarseness, sore throat, dysphagia, dental problems, itching, sneezing,  nasal congestion or discharge of excess mucus or purulent secretions, ear ache,   fever, chills, sweats, unintended wt loss or wt gain, classically pleuritic or exertional cp,  orthopnea pnd or arm/hand swelling  or leg swelling, presyncope, palpitations, abdominal pain, anorexia, nausea, vomiting, diarrhea  or change in bowel habits or change in bladder habits, change in stools or change in urine, dysuria, hematuria,  rash, arthralgias, visual complaints, headache, numbness, weakness or ataxia or problems with walking or coordination,  change in mood or  memory.        Current Meds  Medication Sig        CALCIUM PO Take 600 mg by mouth 2 (two) times daily.     Carboxymethylcellul-Glycerin (REFRESH OPTIVE OP) Place 1 drop into both eyes 2 (two) times daily.   clobetasol (TEMOVATE) 0.05 % external solution As needed   hydrocortisone valerate cream (WESTCORT) 0.2 % Apply 1 application topically as needed.    metroNIDAZOLE (METROCREAM) 0.75 % cream Apply 1 application topically as needed (for rosacea).    MULTIPLE MINERALS PO Take 1 capsule by mouth daily.   omeprazole (PRILOSEC) 20 MG capsule Take 20 mg by mouth 2 (two) times  daily.    potassium chloride (MICRO-K) 10 MEQ CR capsule Take 20 mEq by mouth 2 (two) times daily.    Respiratory Therapy Supplies (FLUTTER) DEVI 1 Device by Does not apply route as needed.   triamterene-hydrochlorothiazide (DYAZIDE) 37.5-25 MG per capsule Take 1 capsule by mouth every morning.   TURMERIC PO Take 1 capsule by mouth daily.                   Objective:      amb wf nad   12/06/2019        141  12/12/2018        141  06/12/2018        143   02/28/2018      139   01/16/18 138 lb 9.6 oz (62.9 kg)  12/05/17 139 lb (63 kg)  07/17/14 141 lb (64 kg)    Vital signs reviewed  12/06/2019  - Note at rest 02 sats  98% on RA   HEENT : pt wearing mask not removed for exam due to covid -19 concerns.    NECK :  without JVD/Nodes/TM/ nl carotid upstrokes bilaterally   LUNGS: no acc muscle use,  Nl contour chest which is clear to A and P bilaterally without cough on insp or exp maneuvers   CV:  RRR  no s3 or murmur or increase in P2, and no edema   ABD:  soft and nontender with nl inspiratory excursion in the supine position. No bruits or organomegaly appreciated, bowel sounds nl  MS:  Nl gait/ ext warm without deformities, calf tenderness, cyanosis or clubbing No obvious joint restrictions   SKIN: warm and dry without lesions    NEURO:  alert, approp, nl sensorium with  no motor or cerebellar deficits apparent.         I personally reviewed images and agree with radiology impression as follows:   CXR:   10/04/19 PA and Lat Stable bronchiectatic changes in the bases similar to that seen on prior exams. No acute abnormality noted.             Assessment

## 2019-12-06 NOTE — Assessment & Plan Note (Addendum)
Onset cough 2017  Trial of ppi bid 12/05/2017  - Allergy profile 12/05/2017 >  Eos 0.2 /  IgE  695 RAST Pos ragweed/ dust  - Sinus CT 12/13/2017 > ok  - HRCT chest 12/13/2017 >>>  Moderate cylindrical and varicoid bronchiectasis scattered throughout both lungs involving all lung lobes, with associated patchy tree-in-bud opacities. Findings could be due to atypical mycobacterial infection (MAI) or recurrent aspiration. 2. Thick irregular bandlike focus of peribronchovascular consolidation in the basilar right lower lobe, favor evolving postinfectious/postinflammatory scarring.   - Alpha one Screen 12/05/17 :  MM  Level 165  - Quant Ig's 01/16/2018 > nl  - flutter valve training 01/16/2018  - Spirometry 02/28/2018  FEV1 1.35 (67%)  Ratio 67  With truncated upper portion of f/v loop -  12/2017 rx zpak cycles  > stopped working winter 2021 assoc with nasal watery drainage so d/c'd and rec sputum sample with next flare then ? Try doxy? As can't take sulfa or pcn  Reviewed again the goals of care in pt with bronchiectasis which include minimizing unnecessary abx exposure with max mucinex/flutter valve use and consider adding lama/low dose ICS if worse doe for the obstr component which remains very mild clinically          Each maintenance medication was reviewed in detail including emphasizing most importantly the difference between maintenance and prns and under what circumstances the prns are to be triggered using an action plan format where appropriate.  Total time for H and P, chart review, counseling, teaching device (flutter) and generating customized AVS unique to this office visit / charting = 30 min    >>> f/u q 6 m

## 2019-12-06 NOTE — Addendum Note (Signed)
Addended by: Sandrea Hughs B on: 12/06/2019 01:01 PM   Modules accepted: Orders

## 2019-12-11 ENCOUNTER — Ambulatory Visit: Payer: Medicare Other | Admitting: Internal Medicine

## 2019-12-18 DIAGNOSIS — B009 Herpesviral infection, unspecified: Secondary | ICD-10-CM | POA: Diagnosis not present

## 2019-12-18 DIAGNOSIS — Z6824 Body mass index (BMI) 24.0-24.9, adult: Secondary | ICD-10-CM | POA: Diagnosis not present

## 2019-12-18 DIAGNOSIS — Z124 Encounter for screening for malignant neoplasm of cervix: Secondary | ICD-10-CM | POA: Diagnosis not present

## 2019-12-18 DIAGNOSIS — M8588 Other specified disorders of bone density and structure, other site: Secondary | ICD-10-CM | POA: Diagnosis not present

## 2019-12-18 DIAGNOSIS — N3945 Continuous leakage: Secondary | ICD-10-CM | POA: Diagnosis not present

## 2019-12-18 DIAGNOSIS — N958 Other specified menopausal and perimenopausal disorders: Secondary | ICD-10-CM | POA: Diagnosis not present

## 2020-01-04 DIAGNOSIS — M810 Age-related osteoporosis without current pathological fracture: Secondary | ICD-10-CM | POA: Diagnosis not present

## 2020-01-11 DIAGNOSIS — M810 Age-related osteoporosis without current pathological fracture: Secondary | ICD-10-CM | POA: Diagnosis not present

## 2020-01-18 DIAGNOSIS — I10 Essential (primary) hypertension: Secondary | ICD-10-CM | POA: Diagnosis not present

## 2020-01-18 DIAGNOSIS — E7800 Pure hypercholesterolemia, unspecified: Secondary | ICD-10-CM | POA: Diagnosis not present

## 2020-01-18 DIAGNOSIS — Z0000 Encounter for general adult medical examination without abnormal findings: Secondary | ICD-10-CM | POA: Diagnosis not present

## 2020-01-18 DIAGNOSIS — R232 Flushing: Secondary | ICD-10-CM | POA: Diagnosis not present

## 2020-01-18 DIAGNOSIS — K219 Gastro-esophageal reflux disease without esophagitis: Secondary | ICD-10-CM | POA: Diagnosis not present

## 2020-01-18 DIAGNOSIS — R5383 Other fatigue: Secondary | ICD-10-CM | POA: Diagnosis not present

## 2020-02-12 DIAGNOSIS — M810 Age-related osteoporosis without current pathological fracture: Secondary | ICD-10-CM | POA: Diagnosis not present

## 2020-03-04 DIAGNOSIS — Z23 Encounter for immunization: Secondary | ICD-10-CM | POA: Diagnosis not present

## 2020-03-06 DIAGNOSIS — M542 Cervicalgia: Secondary | ICD-10-CM | POA: Diagnosis not present

## 2020-03-06 DIAGNOSIS — M545 Low back pain, unspecified: Secondary | ICD-10-CM | POA: Diagnosis not present

## 2020-03-06 DIAGNOSIS — M5030 Other cervical disc degeneration, unspecified cervical region: Secondary | ICD-10-CM | POA: Diagnosis not present

## 2020-03-06 DIAGNOSIS — M5136 Other intervertebral disc degeneration, lumbar region: Secondary | ICD-10-CM | POA: Diagnosis not present

## 2020-03-22 ENCOUNTER — Ambulatory Visit: Payer: Medicare PPO | Attending: Internal Medicine

## 2020-03-22 ENCOUNTER — Other Ambulatory Visit: Payer: Self-pay

## 2020-03-22 DIAGNOSIS — Z23 Encounter for immunization: Secondary | ICD-10-CM

## 2020-03-22 NOTE — Progress Notes (Signed)
   Covid-19 Vaccination Clinic  Name:  Connie Lindsey    MRN: [id number] DOB: 1946-04-21  03/22/2020  [name] was observed post Covid-19 immunization for 15 minutes without incident. She was provided with Vaccine Information Sheet and instruction to access the V-Safe system.   [name] was instructed to call 911 with any severe reactions post vaccine: Marland Kitchen Difficulty breathing  . Swelling of face and throat  . A fast heartbeat  . A bad rash all over body  . Dizziness and weakness

## 2020-06-17 ENCOUNTER — Ambulatory Visit: Payer: Medicare PPO | Admitting: Internal Medicine

## 2020-06-24 ENCOUNTER — Ambulatory Visit: Payer: Medicare PPO | Admitting: Internal Medicine

## 2020-06-24 ENCOUNTER — Encounter: Payer: Self-pay | Admitting: Internal Medicine

## 2020-06-24 ENCOUNTER — Other Ambulatory Visit: Payer: Self-pay

## 2020-06-24 DIAGNOSIS — J479 Bronchiectasis, uncomplicated: Secondary | ICD-10-CM

## 2020-06-24 NOTE — Assessment & Plan Note (Signed)
Onset cough 2017  Trial of ppi bid 12/05/2017  - Allergy profile 12/05/2017 >  Eos 0.2 /  IgE  695 RAST Pos ragweed/ dust  - Sinus CT 12/13/2017 > ok  - HRCT chest 12/13/2017 >>>  Moderate cylindrical and varicoid bronchiectasis scattered throughout both lungs involving all lung lobes, with associated patchy tree-in-bud opacities. Findings could be due to atypical mycobacterial infection (MAI) or recurrent aspiration. 2. Thick irregular bandlike focus of peribronchovascular consolidation in the basilar right lower lobe, favor evolving postinfectious/postinflammatory scarring.   - Alpha one Screen 12/05/17 :  MM  Level 165  - Quant Ig's 01/16/2018 > nl  - flutter valve training 01/16/2018  - Spirometry 02/28/2018  FEV1 1.35 (67%)  Ratio 67  With truncated upper portion of f/v loop -  12/2017 rx zpak cycles  > stopped working winter 2021 assoc with nasal watery drainage so d/c'd and rec sputum sample with next flare then ? Try doxy? As can't take sulfa or pcn  No flares since prev ov/ has cup to take if flares.  In meantime ok to try off pm dose of PPI and just take pepcid 20 mg after supper if any flare in cough on just prilosec 20 mg q am ac  F/u can be yearly, sooner if flares (though first steps are mucinex/ flutter, and check sputum sample if possible prior to abx          Each maintenance medication was reviewed in detail including emphasizing most importantly the difference between maintenance and prns and under what circumstances the prns are to be triggered using an action plan format where appropriate.  Total time for H and P, chart review, counseling, reviewing fltter device(s) and generating customized AVS unique to this office visit / same day charting = 23 min

## 2020-06-24 NOTE — Patient Instructions (Addendum)
Flutter valve helps cough do it's job by keeping the airway open during coughing   We need to get a sputum specimen the next time your mucus turns darker yellow or brown or bloody.  Ok to stop the prilosec before supper but at onset of cough immediately restart the dose your were taking prilosec again 30 min before supper   Please schedule a follow up visit in 12 months but call sooner if needed

## 2020-06-24 NOTE — Progress Notes (Signed)
Connie Lindsey, female    DOB: [birth year],    MRN: [id number]    Brief patient profile:  5 yowf quit smoking 1982 due to IUP no trouble at all but in the 1990s  developed rhinitis  On shots from [facility] and seemed to help p many years and did fine s needing any meds but developed cough around 2017 > [name] eval pos allergy to dust / ragweed and felt it was reflux rx but never took the meds then Jan 2018 cough got worse and dx with RLL air space dx better with abx but never gone and then again cough flared worse Jan 2019 so referred to pulmonary clinic 12/05/2017 by Dr Shaune Pollack     History of Present Illness  12/05/2017  Initial office eval/ Connie Lindsey on Prilosec 20 mg daily 30 min ac since Dec 2018  Chief Complaint  Patient presents with  . Pulmonary Consult    Referred by Dr. Kevan Ny. Pt c/o cough off and on since she had PNA in Jan 2018- worse since Dec 2019.  Her cough is occ prod with clear to yellow sputum.    each time dx as pneumonia  mucus did change to darker but never cleared completely neither clinically nor radiographically  Dyspnea:  Not limited by breathing from desired activities   Cough: worse when leaning back to read at hs  Sleep: ok at 30 degrees / some fare in am of cough  rec Most likely diagnosis is Bronchiectasis = Bronchiectasis =   you have scarring of your bronchial tubes which means that they don't function perfectly normally and mucus tends to pool in certain areas of your lung which can cause pneumonia and further scarring of your lung and bronchial tubes Continue Priloscec 20 mg Take 30- 60 min before your first and last meals of the day  GERD diet  Please see patient coordinator before you leave today  to schedule a CT sinus and HRCT Chest CT  Please remember to go to the lab department downstairs in the basement  for your tests - we will call you with the results when they are available.    01/16/2018  f/u ov/Connie Lindsey re: bronchiectasis  Chief Complaint  Patient  presents with  . Follow-up    Pt states she had been doing well since last visit up until this weekend, 8/24-8/25 and her cough was so bad she got to where she could not sleep. Pt states her throat became so raw from coughing and she also got to the point she even used cough drops. Pt states her cough was productive and was coughing up clear to green mucus and also had c/o wheezing.. Denies any SOB or CP/chest tightness.  Dyspnea:  MMRC1 = can walk nl pace, flat grade, can't hurry or go uphills or steps s sob   Cough: much worse x 48 h/ does not remember instructions on how to manage from last ov / given in writing/ starting to turn slightly purulent  Sleeping: typically sleeps fine / 30 degrees  SABA use: none  02: none   rec For cough >  mucinex dm 1200 mg every 12 hours with glass and use the flutter valve as much as possible For nasty mucus Take a zpak  GERD  You need Prevnar 13- check with Dr Kevan Ny asap  Please schedule a follow up office visit in 6 weeks, call sooner if needed with pfts on return       06/12/2018  f/u ov/Connie Lindsey re: s/p bronchiectasis flare - lots of questions re management  Chief Complaint  Patient presents with  . Follow-up    cough with little clear to light yellow mucus  Dyspnea:  Not limited by breathing from desired activities   Cough: worse since few weeks in Dec 2019 but started zpak May 18 2018 / am mucus turned clear / using flutter some but did not understand how/ when to use mucinex dm or whether could use if with zmax Sleeping: fine      12/12/2018  f/u ov/Connie Lindsey re: bronchiectasis on zpak and ppi bid  Chief Complaint  Patient presents with  . Follow-up    Doing well and no new co's today.    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 Follow-up    No complaints  Dyspnea:  Not limited by breathing from desired activities   Cough:  More in afternoon   Sleeping: flat bed/ several pillows  SABA use: none  02: none  rec When there is a change in your mucus more nasty or bloody please bring [place] a sample in  Keep using the flutter valve as much as you can especially with coughing with mucinex up to 1200mg  every 12 hours as needed      06/24/2020  f/u ov/Connie Lindsey re: bronchiectasis Chief Complaint  Patient presents with  . Follow-up    Breathing is doing well and not coughing much. No new co's.   Dyspnea:  Not limited by breathing from desired activities   Cough: none, even in am / using flutter  Sleeping: bed blocks / 2 pillows  SABA use: none  02: none    No obvious day to day or daytime variability or assoc excess/ purulent sputum or mucus plugs or hemoptysis or cp or chest tightness, subjective wheeze or overt sinus or hb symptoms.   Sleeping  without nocturnal  or early am exacerbation  of respiratory  c/o's or need for noct saba. Also denies any obvious fluctuation of symptoms with weather or environmental changes or other aggravating or alleviating factors except as outlined above   No unusual exposure hx or h/o childhood pna/ asthma or knowledge of premature birth.  Current Allergies, Complete Past Medical History, Past Surgical History, Family History, and Social History were reviewed in 08/22/2020 record.  ROS  The following are not active complaints unless bolded Hoarseness, sore throat, dysphagia, dental problems, itching, sneezing,  nasal congestion or discharge of excess mucus or purulent secretions, ear ache,   fever, chills, sweats, unintended  wt loss or wt gain, classically pleuritic or exertional cp,  orthopnea pnd or arm/hand swelling  or leg swelling, presyncope, palpitations, abdominal pain, anorexia, nausea, vomiting, diarrhea  or change in bowel habits or change in bladder habits, change in stools or change in urine, dysuria, hematuria,  rash, arthralgias, visual complaints, headache, numbness, weakness or ataxia or problems with walking or coordination,  change in mood  or  memory.        Current Meds  Medication Sig  . CALCIUM PO Take 600 mg by mouth 2 (two) times daily.   . Carboxymethylcellul-Glycerin (REFRESH OPTIVE OP) Place 1 drop into both eyes 2 (two) times daily.  . clobetasol (TEMOVATE) 0.05 % external solution As needed  . hydrocortisone valerate cream (WESTCORT) 0.2 % Apply 1 application topically as needed.   . metroNIDAZOLE (METROCREAM) 0.75 % cream Apply 1 application topically as needed (for rosacea).   . MULTIPLE MINERALS PO Take 1 capsule by mouth daily.  Marland Kitchen omeprazole (PRILOSEC) 20 MG capsule Take 20 mg by mouth 2 (two) times daily.   . potassium chloride (MICRO-K) 10 MEQ CR capsule Take 20 mEq by mouth 2 (two) times daily.   Marland Kitchen Respiratory Therapy Supplies (FLUTTER) DEVI 1 Device by Does not apply route as needed.  . triamterene-hydrochlorothiazide (DYAZIDE) 37.5-25 MG per capsule Take 1 capsule by mouth every morning.  . TURMERIC PO Take 1 capsule by mouth daily.  . zoledronic acid (RECLAST) 5 MG/100ML SOLN injection Inject 5 mg into the vein once. Annually                       Objective:         06/24/2020          140 12/06/2019        141  12/12/2018        141  06/12/2018        143   02/28/2018      139   01/16/18 138 lb 9.6 oz (62.9 kg)  12/05/17 139 lb (63 kg)  07/17/14 141 lb (64 kg)    Vital signs reviewed  06/24/2020  - Note at rest 02 sats  97% on RA   General appearance:    Pleasant anxious wf  nad     HEENT : pt wearing mask not removed for exam due to covid -19 concerns.     NECK :  without JVD/Nodes/TM/ nl carotid upstrokes bilaterally   LUNGS: no acc muscle use,  Nl contour chest which is clear to A and P bilaterally without cough on insp or exp maneuvers   CV:  RRR  no s3 or murmur or increase in P2, and no edema   ABD:  soft and nontender with nl inspiratory excursion in the supine position. No bruits or organomegaly appreciated, bowel sounds nl  MS:  Nl gait/ ext warm without deformities, calf tenderness, cyanosis or clubbing No obvious joint restrictions   SKIN: warm and dry without lesions    NEURO:  alert, approp, nl sensorium with  no motor or cerebellar deficits apparent.         Assessment

## 2020-07-23 DIAGNOSIS — M8580 Other specified disorders of bone density and structure, unspecified site: Secondary | ICD-10-CM | POA: Diagnosis not present

## 2020-07-23 DIAGNOSIS — I10 Essential (primary) hypertension: Secondary | ICD-10-CM | POA: Diagnosis not present

## 2020-07-23 DIAGNOSIS — E7800 Pure hypercholesterolemia, unspecified: Secondary | ICD-10-CM | POA: Diagnosis not present

## 2020-07-23 DIAGNOSIS — M159 Polyosteoarthritis, unspecified: Secondary | ICD-10-CM | POA: Diagnosis not present

## 2020-07-23 DIAGNOSIS — K219 Gastro-esophageal reflux disease without esophagitis: Secondary | ICD-10-CM | POA: Diagnosis not present

## 2020-09-08 ENCOUNTER — Other Ambulatory Visit: Payer: Self-pay | Admitting: Family Medicine

## 2020-09-08 DIAGNOSIS — Z1231 Encounter for screening mammogram for malignant neoplasm of breast: Secondary | ICD-10-CM

## 2020-09-11 DIAGNOSIS — M1611 Unilateral primary osteoarthritis, right hip: Secondary | ICD-10-CM | POA: Diagnosis not present

## 2020-09-11 DIAGNOSIS — M25561 Pain in right knee: Secondary | ICD-10-CM | POA: Diagnosis not present

## 2020-09-11 DIAGNOSIS — M25562 Pain in left knee: Secondary | ICD-10-CM | POA: Diagnosis not present

## 2020-09-25 DIAGNOSIS — Z96642 Presence of left artificial hip joint: Secondary | ICD-10-CM | POA: Diagnosis not present

## 2020-09-25 DIAGNOSIS — M25552 Pain in left hip: Secondary | ICD-10-CM | POA: Diagnosis not present

## 2020-09-25 DIAGNOSIS — M545 Low back pain, unspecified: Secondary | ICD-10-CM | POA: Diagnosis not present

## 2020-10-13 DIAGNOSIS — M25552 Pain in left hip: Secondary | ICD-10-CM | POA: Diagnosis not present

## 2020-10-13 DIAGNOSIS — M5459 Other low back pain: Secondary | ICD-10-CM | POA: Diagnosis not present

## 2020-10-13 DIAGNOSIS — Z96642 Presence of left artificial hip joint: Secondary | ICD-10-CM | POA: Diagnosis not present

## 2020-10-14 ENCOUNTER — Other Ambulatory Visit: Payer: Self-pay | Admitting: Student

## 2020-10-14 DIAGNOSIS — M5459 Other low back pain: Secondary | ICD-10-CM

## 2020-10-15 ENCOUNTER — Other Ambulatory Visit: Payer: Self-pay

## 2020-10-15 ENCOUNTER — Ambulatory Visit
Admission: RE | Admit: 2020-10-15 | Discharge: 2020-10-15 | Disposition: A | Discharge status: Home / Self Care | Payer: Medicare PPO | Source: Ambulatory Visit | Attending: Student | Admitting: Student

## 2020-10-15 ENCOUNTER — Other Ambulatory Visit: Payer: Self-pay | Admitting: Student

## 2020-10-15 DIAGNOSIS — R102 Pelvic and perineal pain: Secondary | ICD-10-CM | POA: Diagnosis not present

## 2020-10-15 DIAGNOSIS — Z96642 Presence of left artificial hip joint: Secondary | ICD-10-CM | POA: Diagnosis not present

## 2020-10-15 DIAGNOSIS — M5459 Other low back pain: Secondary | ICD-10-CM

## 2020-10-15 DIAGNOSIS — M545 Low back pain, unspecified: Secondary | ICD-10-CM | POA: Diagnosis not present

## 2020-10-15 IMAGING — MR MR LUMBAR SPINE W/O CM
4 of 5 series · 20 of 48 positions shown · non-contrast
Comparison: None

CLINICAL DATA: Provided history: Other low back pain. Additional
history provided by scanning technologist: Patient reports low back
and pelvic pain since falling [DATE].

EXAM:
MRI LUMBAR SPINE WITHOUT CONTRAST
TECHNIQUE: Multiplanar, multisequence MR imaging of the lumbar spine was
performed. No intravenous contrast was administered.

[Series 5: T2 · sagittal · 4.0mm · 0.88mm/px · 6 of 15 slices shown (1 of 2)]
[im 1/15]
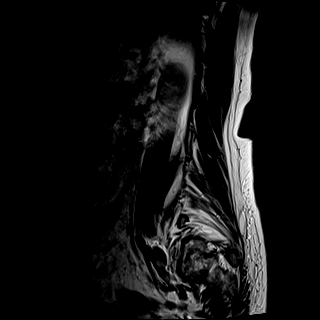
[im 3/15]
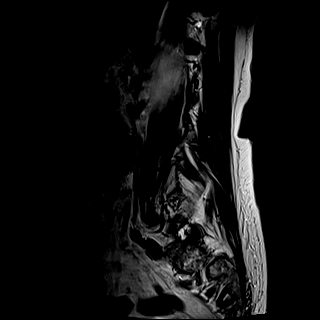
[im 6/15]
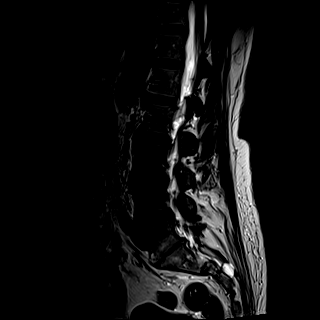
[im 9/15]
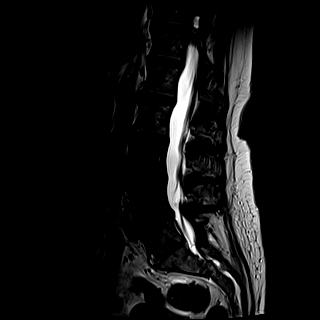
[im 12/15]
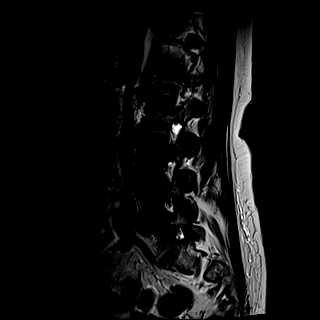
[im 15/15]
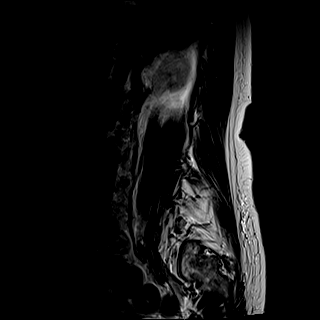

[Series 6: T1 · sagittal · 4.0mm · 0.88mm/px · 3 of 15 slices shown (1 of 2)]
[im 3/15]
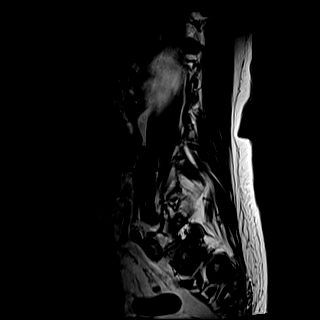
[im 9/15]
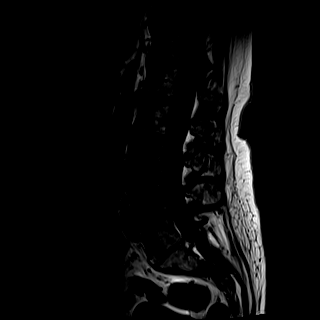
[im 15/15]
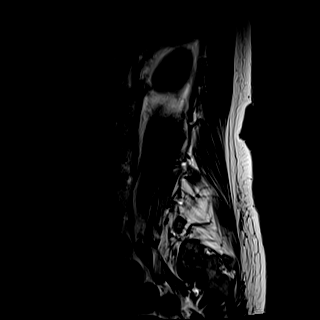

[Series 10: T1 · axial · 4.0mm · 0.28mm/px · z∈[-110,+42]mm · 3 of 41 slices shown (2 of 2)]
[im 6/41]
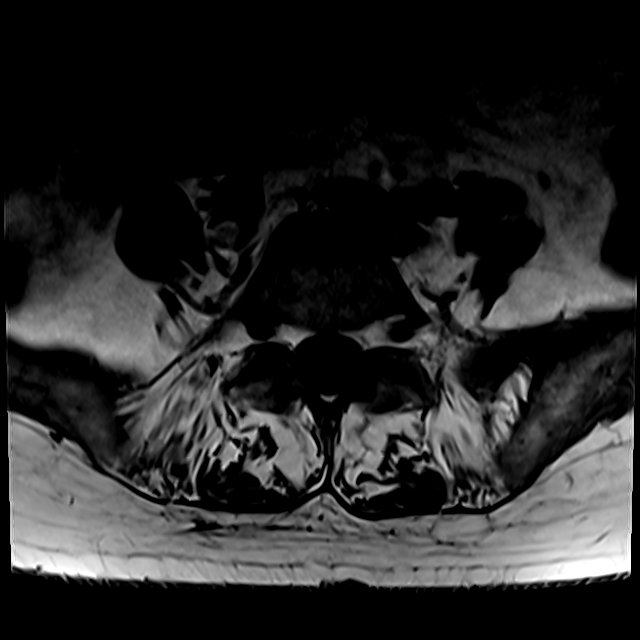
[im 21/41]
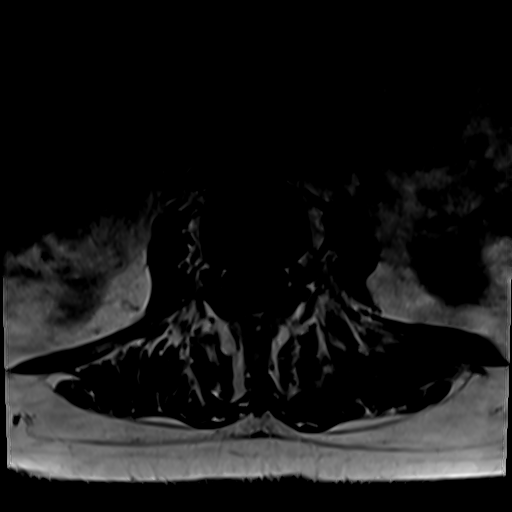
[im 35/41]
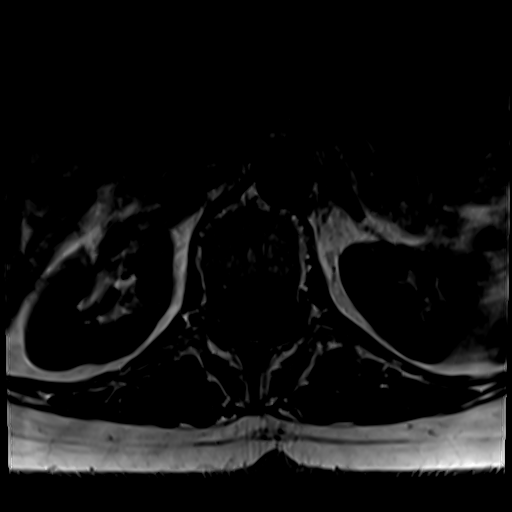

[Series 13: T2 · axial · 4.0mm · 0.28mm/px · z∈[-135,+42]mm · 8 of 41 slices shown (2 of 2)]
[im 1/41]
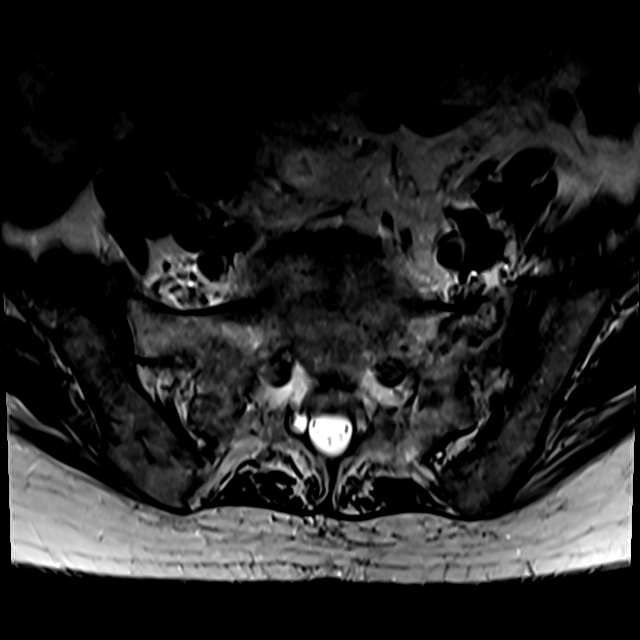
[im 6/41]
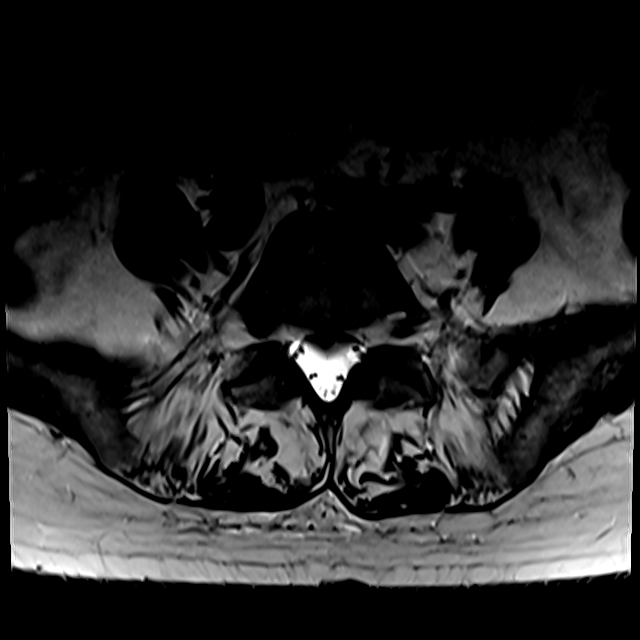
[im 12/41]
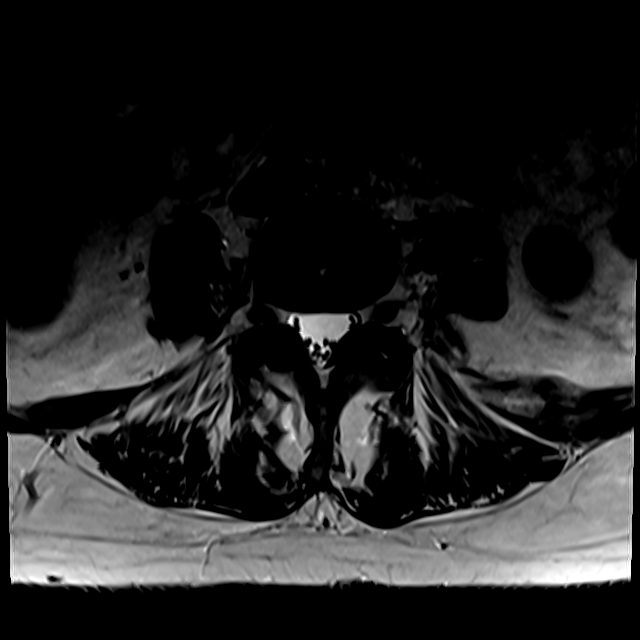
[im 18/41]
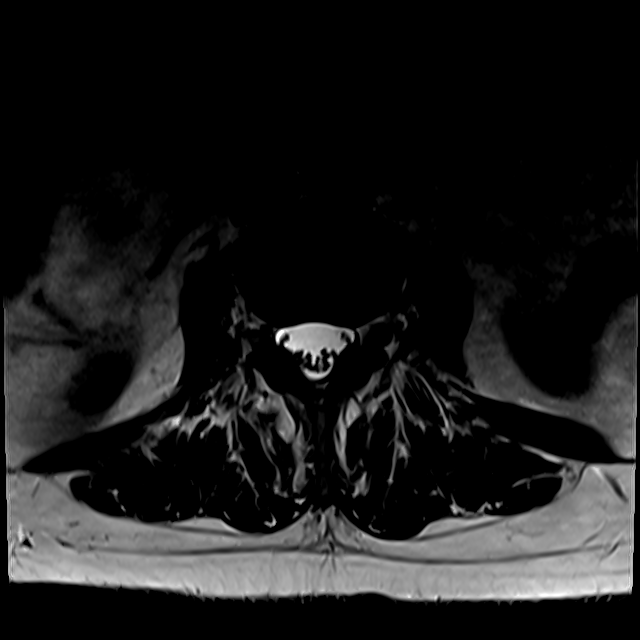
[im 21/41]
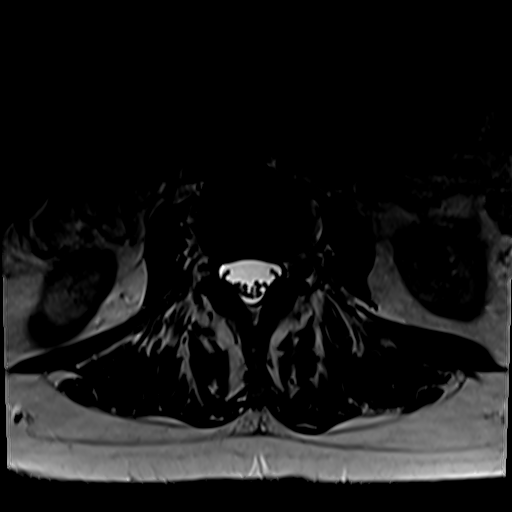
[im 23/41]
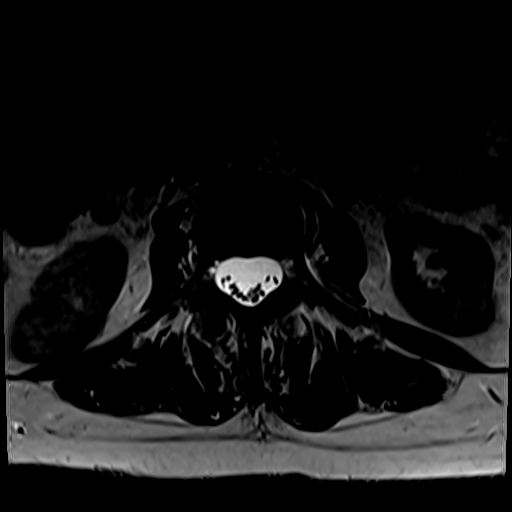
[im 29/41]
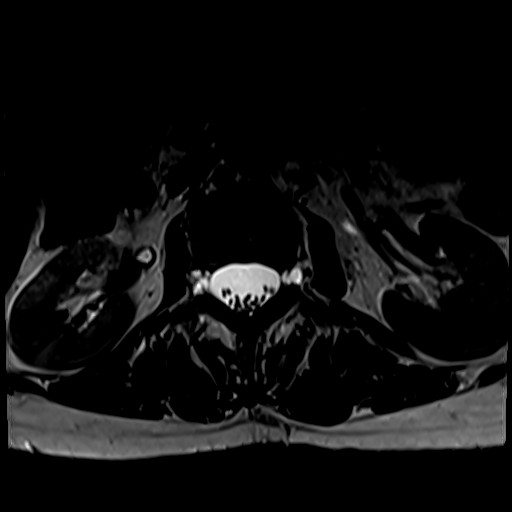
[im 35/41]
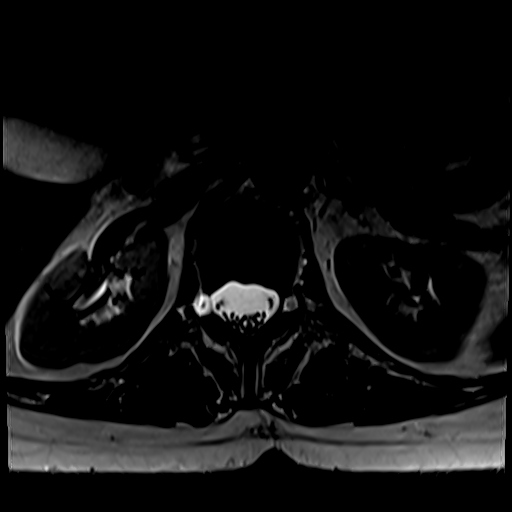

[20 of 48 positions shown; findings below may reference images not displayed]

FINDINGS: Segmentation: 5 lumbar vertebrae are assumed. The caudal most
well-formed intervertebral disc space is designated L5-S1.

Alignment:  No significant spondylolisthesis.

Vertebrae: Vertebral body height is maintained. Indeterminate 11 mm
T1 hypointense and T2/STIR hyperintense lesion within the L5
vertebral body (series 7, image 8). Prominent edema is present
within the right L5 pedicle. Prominent edema is also partially
imaged within the sacrum at the level of the S1 and S2 bodies and
extending into the bilateral sacral ala.

Conus medullaris and cauda equina: Conus extends to the L1 level. No
signal abnormality within the visualized distal spinal cord.
Multilevel perineural cysts.

Paraspinal and other soft tissues: No abnormality identified within
included portions of the abdomen/retroperitoneum. Paraspinal soft
tissues within normal limits.

Disc levels:

Advanced disc degeneration at L5-S1. No more than mild disc
degeneration at the remaining levels.

T11-T12: Imaged sagittally. Facet arthrosis/ligamentum flavum
hypertrophy. No significant disc herniation or stenosis.

T12-L1: No significant disc herniation or stenosis.

L1-L2: No significant disc herniation or stenosis.

L2-L3: Minimal facet arthrosis and ligamentum flavum hypertrophy. No
significant disc herniation or stenosis.

L3-L4: Minimal disc bulge. Mild facet arthrosis/ligamentum flavum
hypertrophy. No significant spinal canal or foraminal stenosis.

L4-L5: Broad-based central disc protrusion eccentric to the left at
site of posterior annular fissure. Facet arthrosis/ligamentum flavum
hypertrophy. Mild left greater than right subarticular narrowing
without appreciable nerve root impingement. Mild relative narrowing
of the central canal. No significant foraminal stenosis

L5-S1: Disc bulge with circumferential endplate spurring/osteophyte
ridge. Mild facet arthrosis/ligamentum flavum hypertrophy. Mild
bilateral subarticular narrowing without nerve root impingement.
Central canal patent. Mild relative bilateral neural foraminal
narrowing.

Impressions #1 and #2 will be called to the ordering clinician or
representative by the Radiologist Assistant, and communication
documented in the PACS or [REDACTED].
IMPRESSION: Partially imaged prominent edema within the sacrum at the level of
the S1 and S2 bodies, and extending into the bilateral sacral ala.
Given the provided history of recent fall, findings are most
suspicious for acute/early subacute sacral fracture. Prominent edema
is also present within the right L5 transverse process, suspicious
for an additional site of acute/subacute fracture. Correlate with
the findings on the concurrently performed left hip MRI.
Additionally, consider a non-contrast lumbar spine CT for further
evaluation.

Indeterminate 11 mm T2/STIR hyperintense lesion within the L5
vertebral body. Consider a 4-6 month follow-up MRI to ensure
stability.

Lumbar spondylosis, as outlined and with findings most notably as
follows.

At L4-L5, there is a broad-based central disc protrusion at site of
posterior annular fissure. This contributes to mild bilateral
subarticular narrowing without appreciable nerve root impingement.
It also contributes to mild relative central canal narrowing.

At L5-S1, there is advanced disc degeneration. Multifactorial mild
bilateral subarticular narrowing without appreciable nerve root
impingement. Mild relative bilateral neural foraminal narrowing.

## 2020-10-15 IMAGING — MR MR HIP*L* W/O CM
6 series · 40 of 40 positions shown · non-contrast
Comparison: None.

CLINICAL DATA: Low back and pelvic pain after fall on [DATE] of
the a

EXAM:
MR OF THE LEFT HIP WITHOUT CONTRAST
TECHNIQUE: Multiplanar, multisequence MR imaging was performed. No intravenous
contrast was administered.

[Series 8: STIR · axial · left · 5.0mm · 1.19mm/px · z∈[-241,+64]mm · 10 of 48 slices shown (1 of 2)]
[im 1/48]
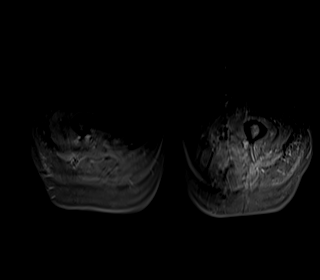
[im 6/48]
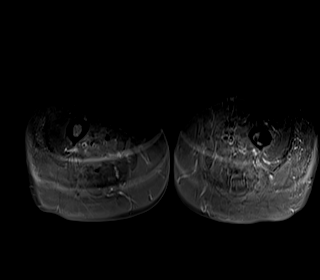
[im 11/48]
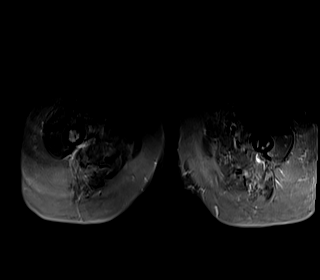
[im 16/48]
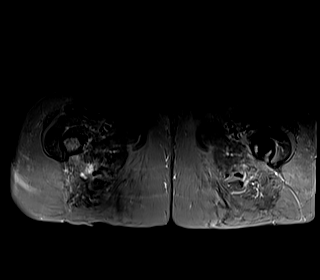
[im 21/48]
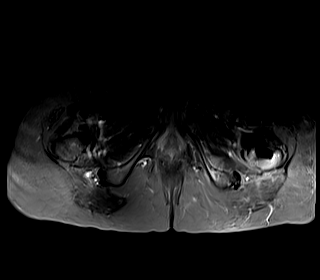
[im 27/48]
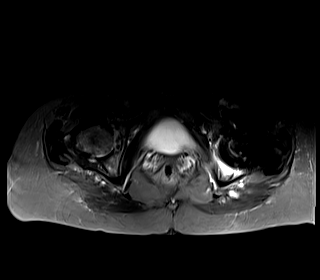
[im 32/48]
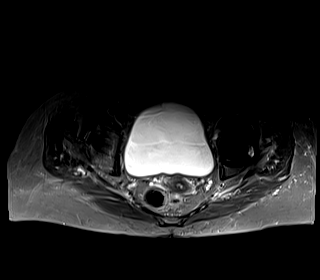
[im 37/48]
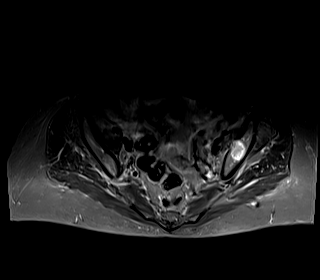
[im 42/48]
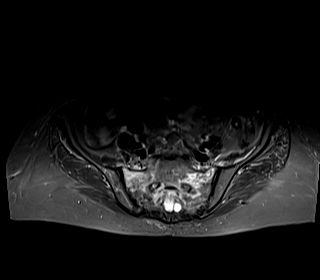
[im 48/48]
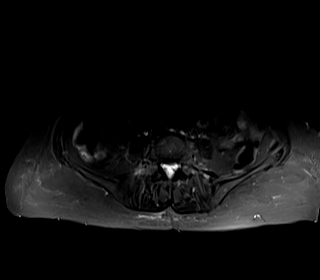

[Series 10: T1 · coronal · left · 4.0mm · 0.85mm/px · 7 of 37 slices shown]
[im 1/37]
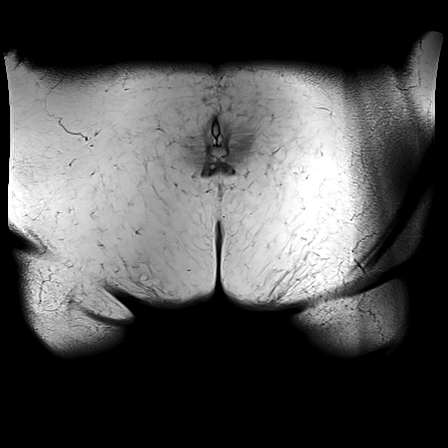
[im 7/37]
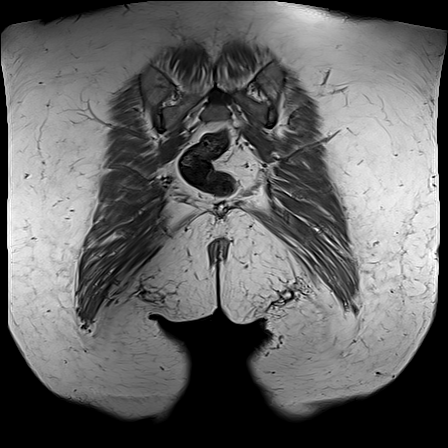
[im 13/37]
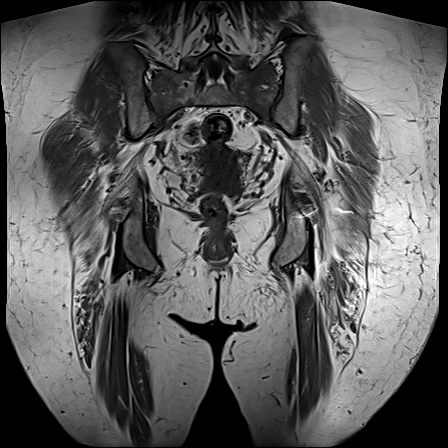
[im 19/37]
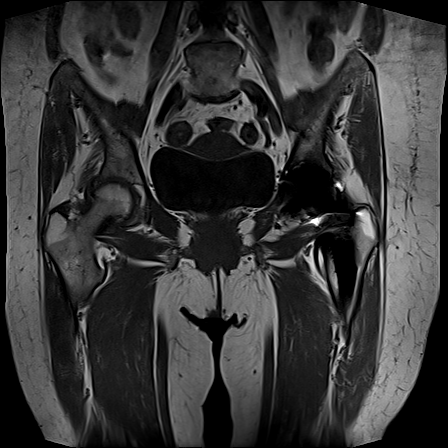
[im 25/37]
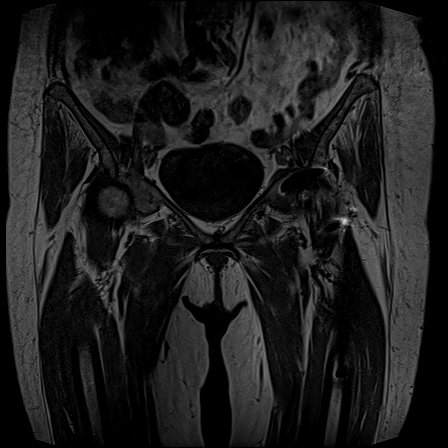
[im 31/37]
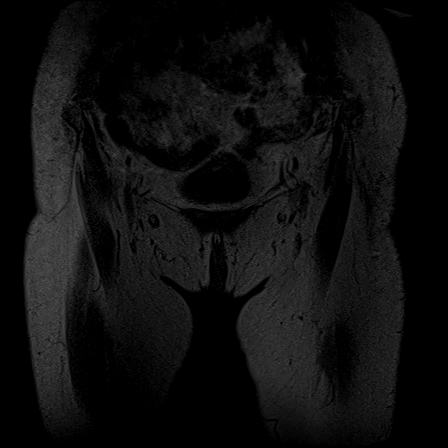
[im 37/37]
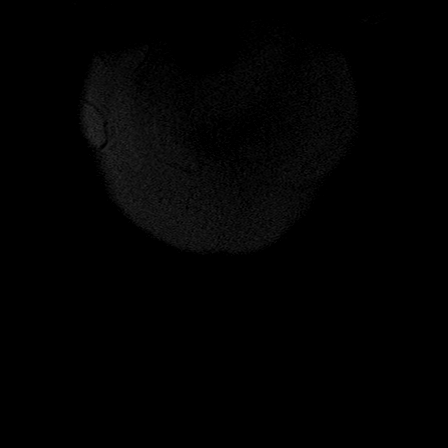

[Series 11: PD · sagittal · left · 3.2mm · 1.17mm/px · 7 of 40 slices shown (1 of 2)]
[im 1/40]
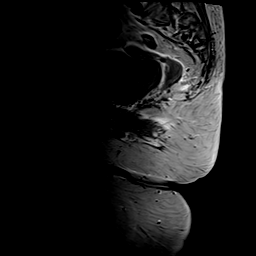
[im 7/40]
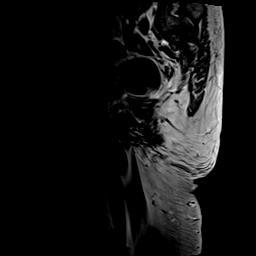
[im 14/40]
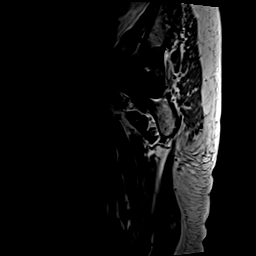
[im 20/40]
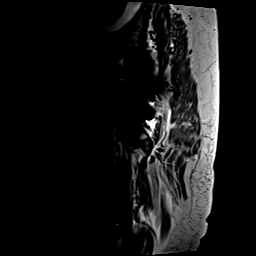
[im 27/40]
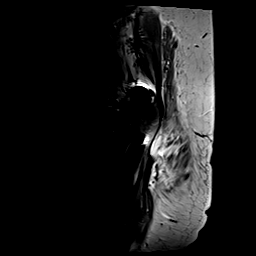
[im 33/40]
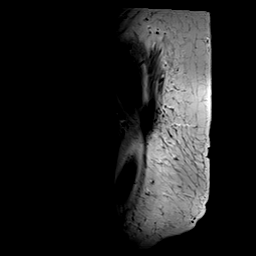
[im 40/40]
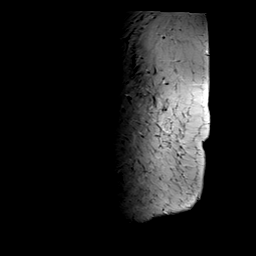

[Series 12: PD fat-sat · axial · left · 5.0mm · 0.70mm/px · z∈[-242,+11]mm · 7 of 40 slices shown]
[im 1/40]
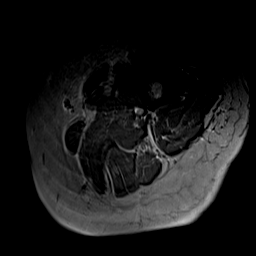
[im 7/40]
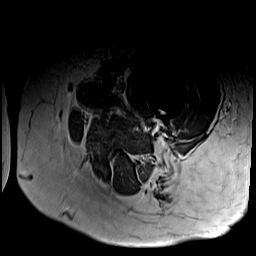
[im 14/40]
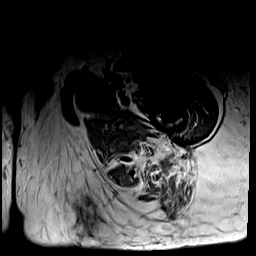
[im 20/40]
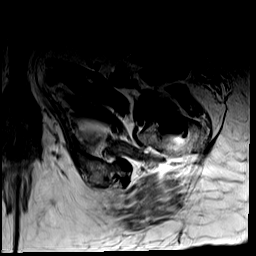
[im 27/40]
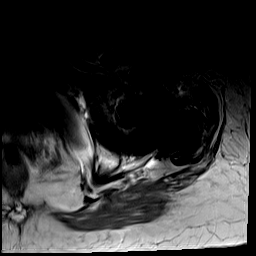
[im 33/40]
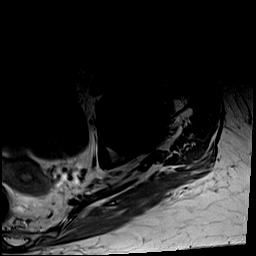
[im 40/40]
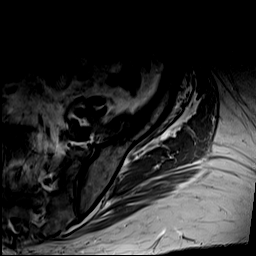

[Series 13: PD · coronal · left · 4.0mm · 0.88mm/px · 4 of 23 slices shown (2 of 2)]
[im 1/23]
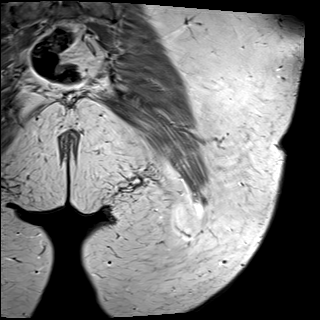
[im 8/23]
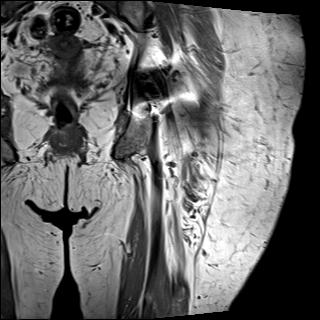
[im 15/23]
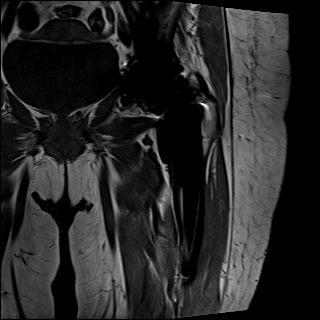
[im 23/23]
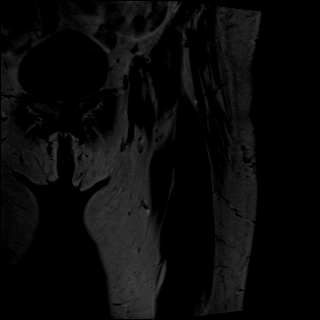

[Series 14: STIR · coronal · left · 4.0mm · 1.25mm/px · 5 of 29 slices shown (2 of 2)]
[im 1/29]
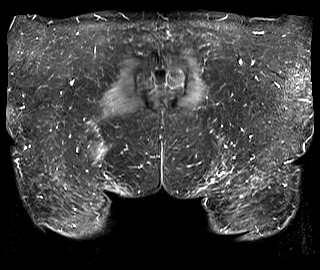
[im 8/29]
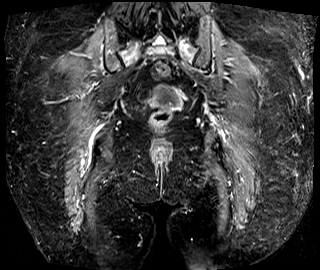
[im 15/29]
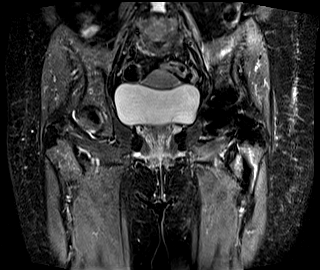
[im 22/29]
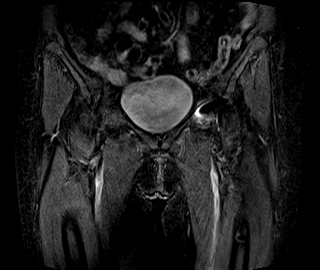
[im 29/29]
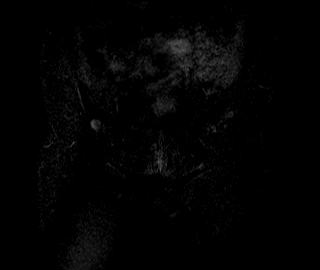

[40 of 40 positions shown; findings below may reference images not displayed]

FINDINGS: Bones/Joint/Cartilage

Acute nondisplaced bilateral sacral alar fractures with extensive
Central portion of the S2 segment is most prominently involved with
expected fracture extension to the S1 and S2 foramina bilaterally.
Bilateral SI joints are intact without diastasis.

Remaining bony pelvis appears intact. No pubic symphysis diastasis.
Previous left hip arthroplasty with associated metallic
susceptibility artifact. No periprosthetic fluid collection is seen.
Right hip joint intact. No femoral head avascular necrosis. No
evidence of marrow replacing bone lesion.

Ligaments

Intact.

Muscles and Tendons

No acute musculotendinous abnormality about the left hip.

Soft tissues

No soft tissue edema or fluid collection. No inguinal
lymphadenopathy. Scattered colonic diverticulosis. No acute findings
are evident within the pelvis.
IMPRESSION: Acute nondisplaced bilateral sacral alar fractures with extensive
bone marrow edema.

## 2020-10-28 ENCOUNTER — Ambulatory Visit: Payer: Medicare PPO

## 2020-11-21 DIAGNOSIS — M5459 Other low back pain: Secondary | ICD-10-CM | POA: Diagnosis not present

## 2020-11-21 DIAGNOSIS — Z96642 Presence of left artificial hip joint: Secondary | ICD-10-CM | POA: Diagnosis not present

## 2020-12-20 ENCOUNTER — Ambulatory Visit
Admission: RE | Admit: 2020-12-20 | Discharge: 2020-12-20 | Disposition: A | Discharge status: Home / Self Care | Payer: Medicare PPO | Source: Ambulatory Visit | Attending: Family Medicine | Admitting: Family Medicine

## 2020-12-20 DIAGNOSIS — Z1231 Encounter for screening mammogram for malignant neoplasm of breast: Secondary | ICD-10-CM | POA: Diagnosis not present

## 2020-12-20 IMAGING — MG MM DIGITAL SCREENING BILAT W/ TOMO AND CAD
8 series · 9 of 24 positions shown · non-contrast
Comparison: Previous exam(s).

CLINICAL DATA: Screening.

EXAM:
DIGITAL SCREENING BILATERAL MAMMOGRAM WITH TOMOSYNTHESIS AND CAD
TECHNIQUE: Bilateral screening digital craniocaudal and mediolateral oblique
mammograms were obtained. Bilateral screening digital breast
tomosynthesis was performed. The images were evaluated with
computer-aided detection.

[R MLO synth-2D]
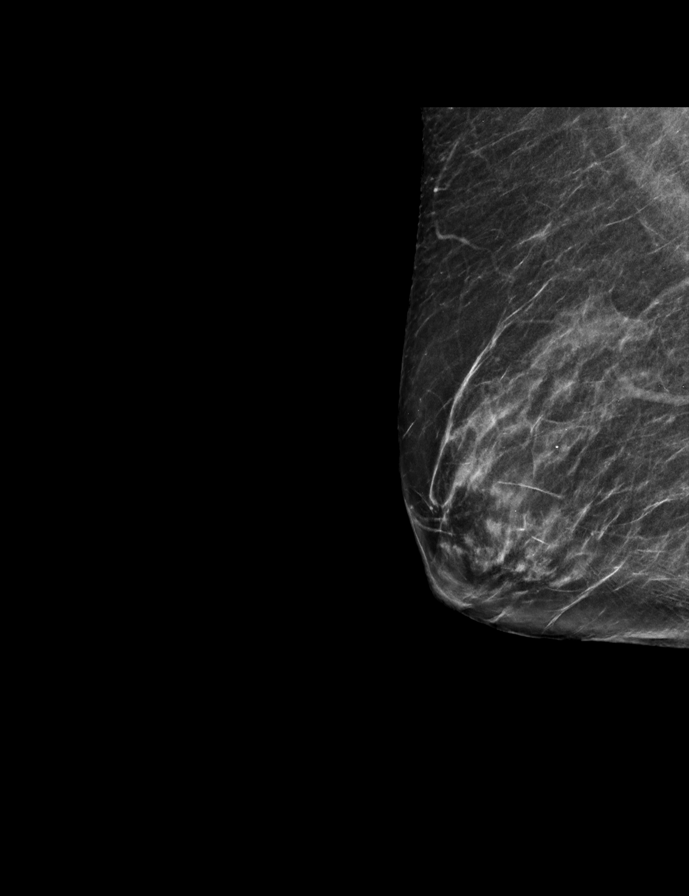

[L CC synth-2D]
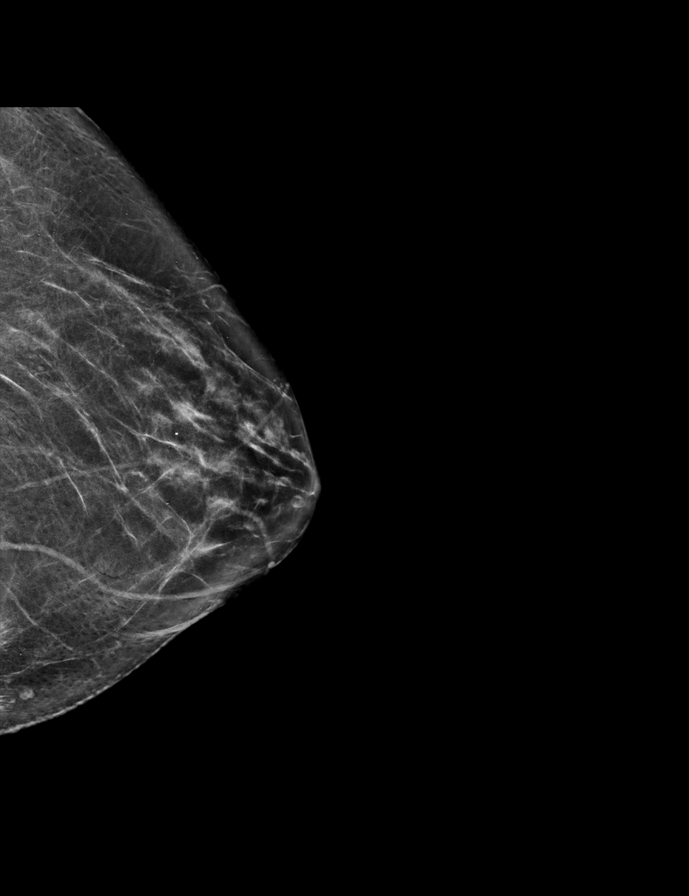

[L MLO synth-2D]
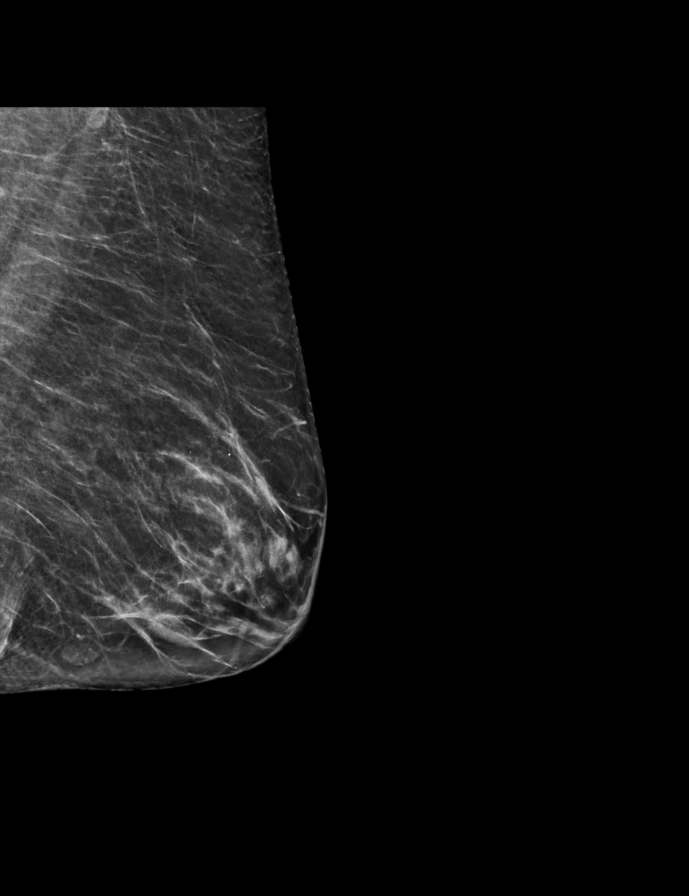

[R CC synth-2D]
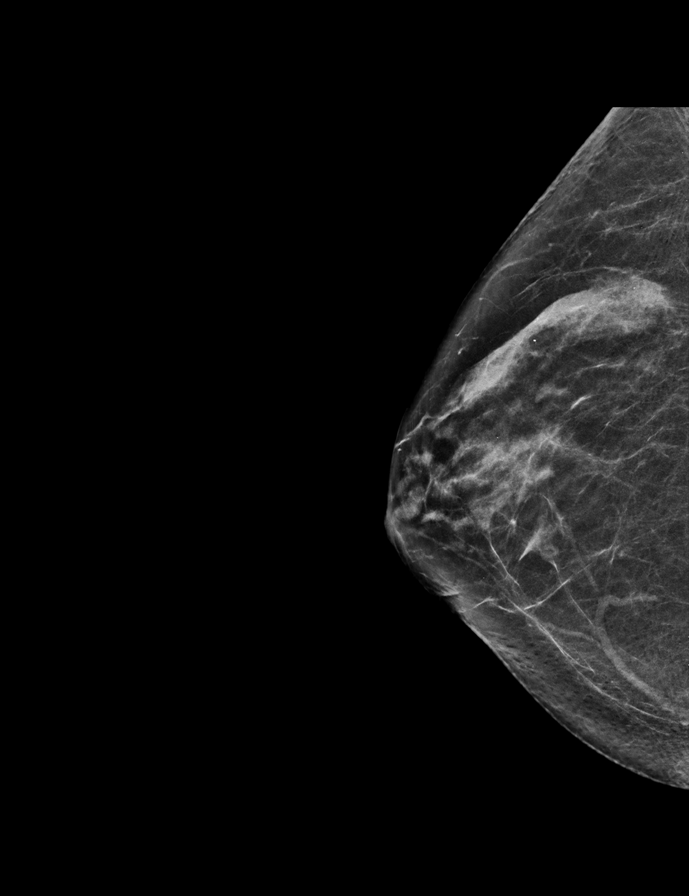

[R CC tomo · 2 of 58 frames shown]
[frame 19/58]
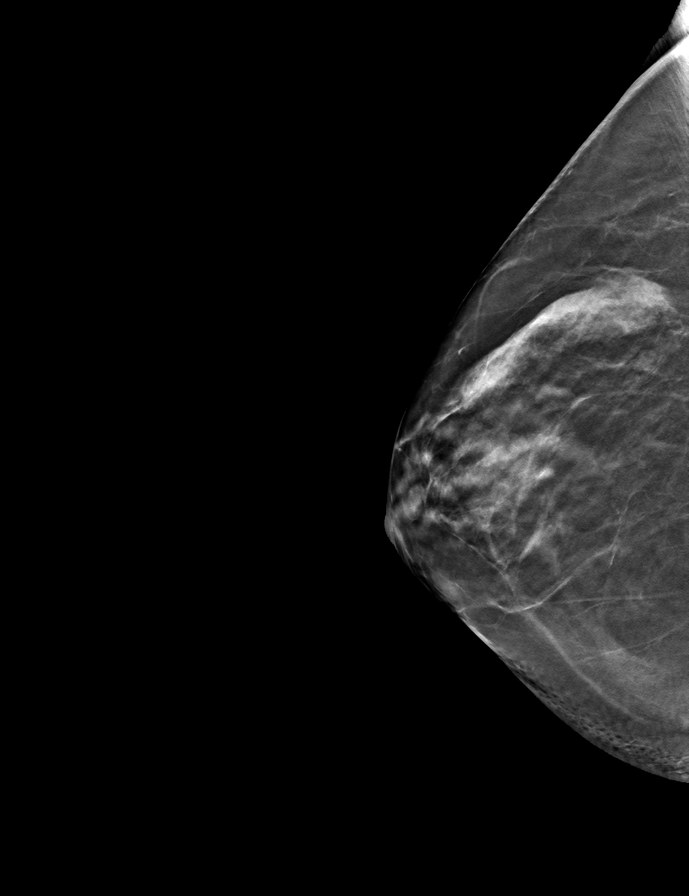
[frame 29/58]
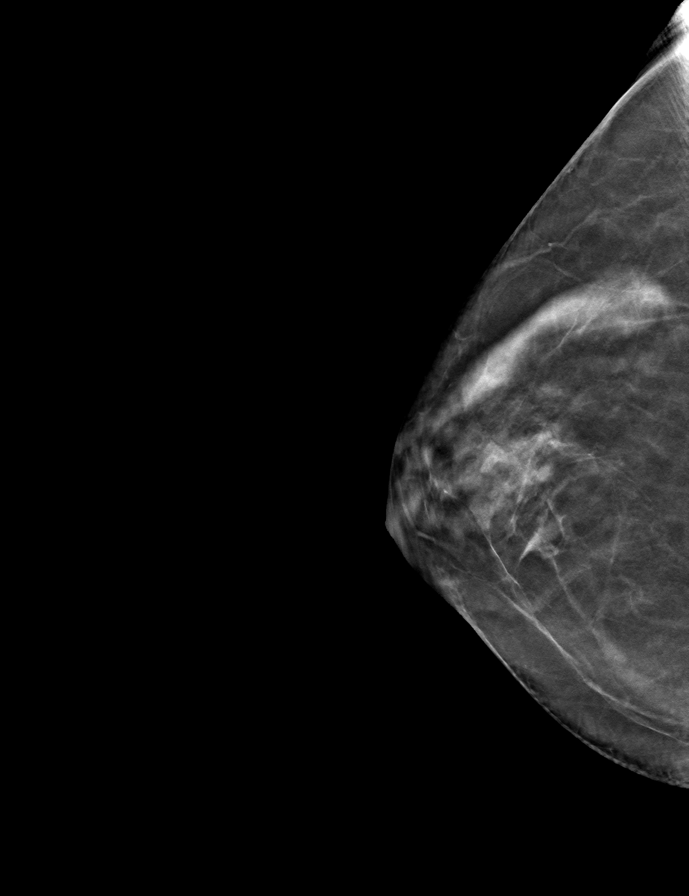

[L MLO tomo · tomo slice 29/56.0]
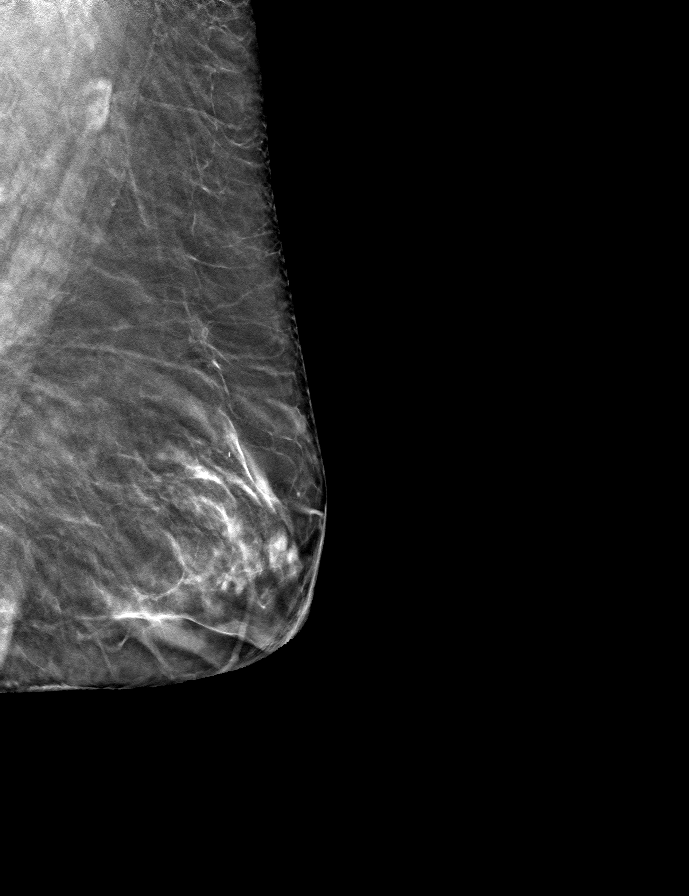

[L CC tomo · tomo slice 29/57.0]
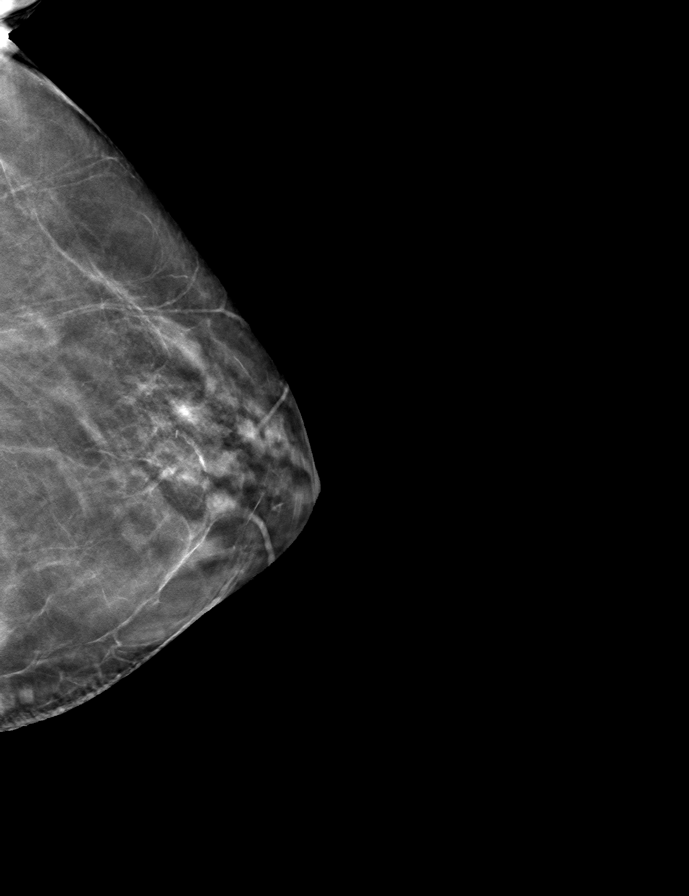

[R MLO tomo · tomo slice 29/56.0]
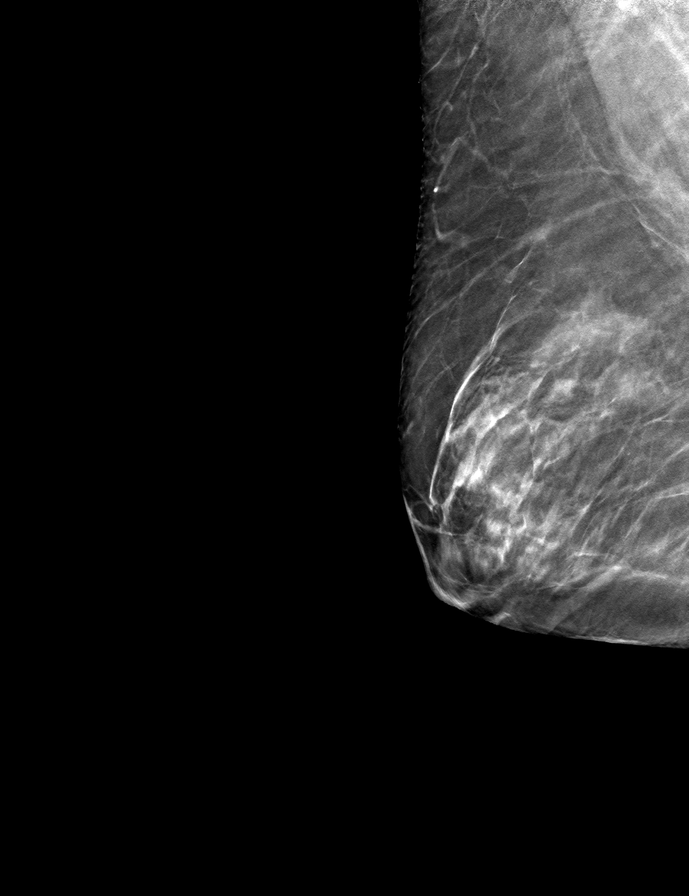

[9 of 24 positions shown; findings below may reference images not displayed]

ACR Breast Density Category b: There are scattered areas of
fibroglandular density.
FINDINGS: There are no findings suspicious for malignancy.
IMPRESSION: No mammographic evidence of malignancy. A result letter of this
screening mammogram will be mailed directly to the patient.

RECOMMENDATION:
Screening mammogram in one year. (Code:[BY])

BI-RADS CATEGORY  1: Negative.

## 2020-12-22 ENCOUNTER — Ambulatory Visit: Payer: Medicare PPO

## 2020-12-23 DIAGNOSIS — R2689 Other abnormalities of gait and mobility: Secondary | ICD-10-CM | POA: Diagnosis not present

## 2021-01-12 ENCOUNTER — Other Ambulatory Visit (HOSPITAL_BASED_OUTPATIENT_CLINIC_OR_DEPARTMENT_OTHER): Payer: Self-pay

## 2021-01-12 ENCOUNTER — Other Ambulatory Visit: Payer: Self-pay

## 2021-01-12 ENCOUNTER — Ambulatory Visit: Payer: Medicare PPO | Attending: Internal Medicine

## 2021-01-12 DIAGNOSIS — Z23 Encounter for immunization: Secondary | ICD-10-CM

## 2021-01-12 MED ORDER — PFIZER-BIONT COVID-19 VAC-TRIS 30 MCG/0.3ML IM SUSP
INTRAMUSCULAR | 0 refills | Status: DC
  Filled 2021-01-12: qty 0.3, 1d supply, fill #0

## 2021-01-12 NOTE — Progress Notes (Signed)
   Covid-19 Vaccination Clinic  Name:  Connie Lindsey    MRN: [id number] DOB: 06/18/1945  01/12/2021  Connie Lindsey was observed post Covid-19 immunization for 15 minutes without incident. She was provided with Vaccine Information Sheet and instruction to access the V-Safe system.   Connie Lindsey was instructed to call 911 with any severe reactions post vaccine: Difficulty breathing  Swelling of face and throat  A fast heartbeat  A bad rash all over body  Dizziness and weakness   Immunizations Administered     Name Date Dose VIS Date Route   PFIZER Comrnaty(Gray TOP) Covid-19 Vaccine 01/12/2021 12:57 PM 0.3 mL 05/01/2020 Intramuscular   Manufacturer: [employer], [employer]   Lot: [id number]   NDC: [phone]

## 2021-02-06 DIAGNOSIS — Z23 Encounter for immunization: Secondary | ICD-10-CM | POA: Diagnosis not present

## 2021-02-06 DIAGNOSIS — R634 Abnormal weight loss: Secondary | ICD-10-CM | POA: Diagnosis not present

## 2021-02-06 DIAGNOSIS — E7800 Pure hypercholesterolemia, unspecified: Secondary | ICD-10-CM | POA: Diagnosis not present

## 2021-02-06 DIAGNOSIS — K219 Gastro-esophageal reflux disease without esophagitis: Secondary | ICD-10-CM | POA: Diagnosis not present

## 2021-02-06 DIAGNOSIS — Z0000 Encounter for general adult medical examination without abnormal findings: Secondary | ICD-10-CM | POA: Diagnosis not present

## 2021-02-06 DIAGNOSIS — M8580 Other specified disorders of bone density and structure, unspecified site: Secondary | ICD-10-CM | POA: Diagnosis not present

## 2021-02-06 DIAGNOSIS — I10 Essential (primary) hypertension: Secondary | ICD-10-CM | POA: Diagnosis not present

## 2021-02-10 DIAGNOSIS — Z6823 Body mass index (BMI) 23.0-23.9, adult: Secondary | ICD-10-CM | POA: Diagnosis not present

## 2021-02-10 DIAGNOSIS — M810 Age-related osteoporosis without current pathological fracture: Secondary | ICD-10-CM | POA: Diagnosis not present

## 2021-02-10 DIAGNOSIS — Z01419 Encounter for gynecological examination (general) (routine) without abnormal findings: Secondary | ICD-10-CM | POA: Diagnosis not present

## 2021-02-11 DIAGNOSIS — H35371 Puckering of macula, right eye: Secondary | ICD-10-CM | POA: Diagnosis not present

## 2021-02-11 DIAGNOSIS — Z961 Presence of intraocular lens: Secondary | ICD-10-CM | POA: Diagnosis not present

## 2021-02-11 DIAGNOSIS — H04123 Dry eye syndrome of bilateral lacrimal glands: Secondary | ICD-10-CM | POA: Diagnosis not present

## 2021-02-11 DIAGNOSIS — H524 Presbyopia: Secondary | ICD-10-CM | POA: Diagnosis not present

## 2021-03-06 DIAGNOSIS — I10 Essential (primary) hypertension: Secondary | ICD-10-CM | POA: Diagnosis not present

## 2021-04-02 DIAGNOSIS — M810 Age-related osteoporosis without current pathological fracture: Secondary | ICD-10-CM | POA: Diagnosis not present

## 2021-04-30 ENCOUNTER — Ambulatory Visit: Payer: Medicare PPO | Attending: Internal Medicine

## 2021-04-30 ENCOUNTER — Other Ambulatory Visit (HOSPITAL_BASED_OUTPATIENT_CLINIC_OR_DEPARTMENT_OTHER): Payer: Self-pay

## 2021-04-30 DIAGNOSIS — Z23 Encounter for immunization: Secondary | ICD-10-CM

## 2021-04-30 MED ORDER — PFIZER COVID-19 VAC BIVALENT 30 MCG/0.3ML IM SUSP
INTRAMUSCULAR | 0 refills | Status: DC
  Filled 2021-04-30: qty 0.3, 1d supply, fill #0

## 2021-04-30 NOTE — Progress Notes (Signed)
   Covid-19 Vaccination Clinic  Name:  BRINDA FOCHT    MRN: [id number] DOB: 1945/10/24  04/30/2021  Ms. Scinto was observed post Covid-19 immunization for 15 minutes without incident. She was provided with Vaccine Information Sheet and instruction to access the V-Safe system.   Ms. Brunelle was instructed to call 911 with any severe reactions post vaccine: Difficulty breathing  Swelling of face and throat  A fast heartbeat  A bad rash all over body  Dizziness and weakness   Immunizations Administered     Name Date Dose VIS Date Route   Pfizer Covid-19 Vaccine Bivalent Booster 04/30/2021 11:28 AM 0.3 mL 01/21/2021 Intramuscular   Manufacturer: [employer], [employer]   Lot: [id number]   NDC: [phone]

## 2021-06-24 ENCOUNTER — Ambulatory Visit: Payer: Medicare PPO | Admitting: Internal Medicine

## 2021-06-30 ENCOUNTER — Ambulatory Visit: Payer: Medicare PPO | Admitting: Internal Medicine

## 2021-06-30 ENCOUNTER — Other Ambulatory Visit: Payer: Self-pay

## 2021-06-30 ENCOUNTER — Encounter: Payer: Self-pay | Admitting: Internal Medicine

## 2021-06-30 ENCOUNTER — Ambulatory Visit (INDEPENDENT_AMBULATORY_CARE_PROVIDER_SITE_OTHER): Payer: Medicare PPO

## 2021-06-30 DIAGNOSIS — J479 Bronchiectasis, uncomplicated: Secondary | ICD-10-CM | POA: Diagnosis not present

## 2021-06-30 IMAGING — DX DG CHEST 2V
2 series · 2 of 2 positions shown · non-contrast
Comparison: [DATE]

CLINICAL DATA: Cough.

EXAM:
CHEST - 2 VIEW

[chest pa]
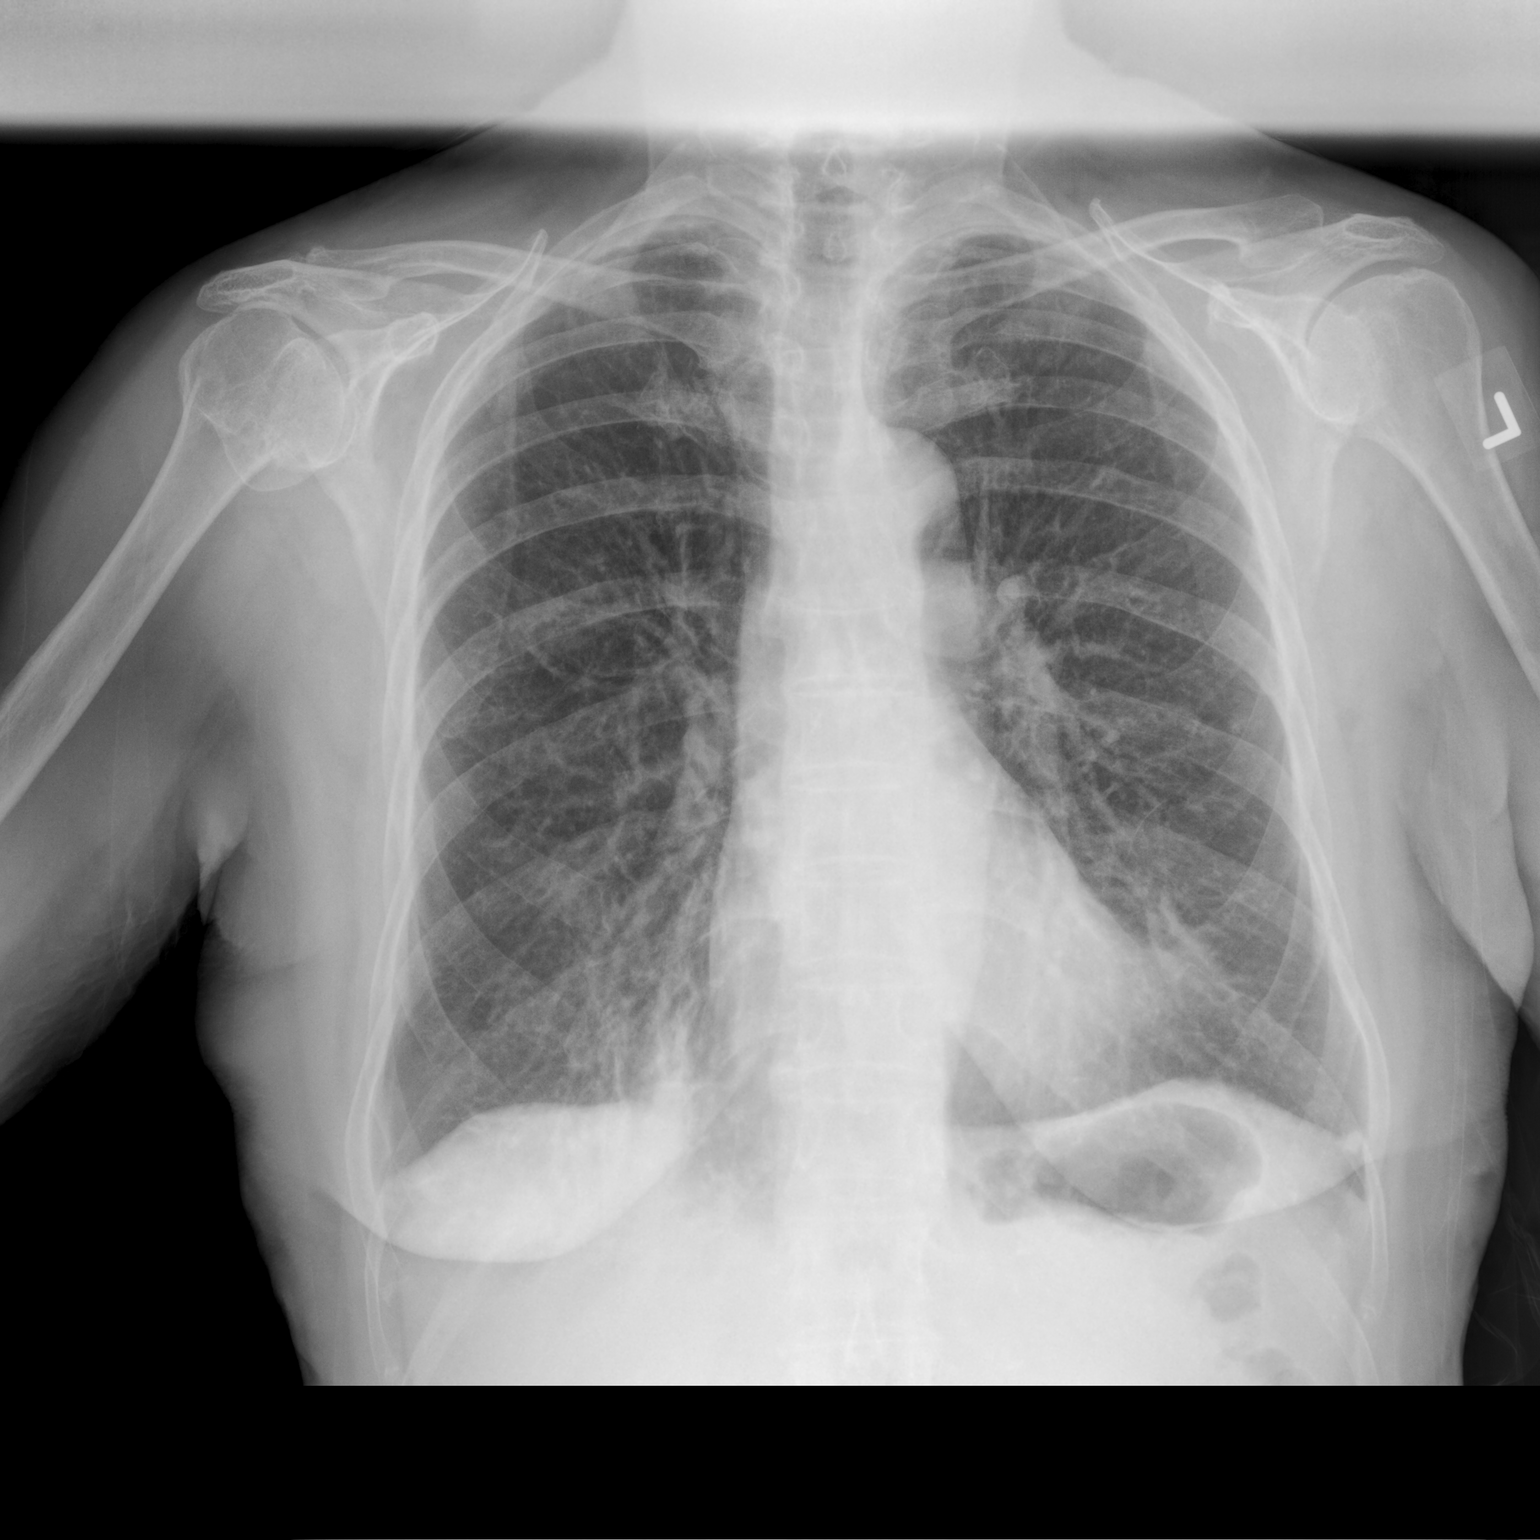

[chest lat]
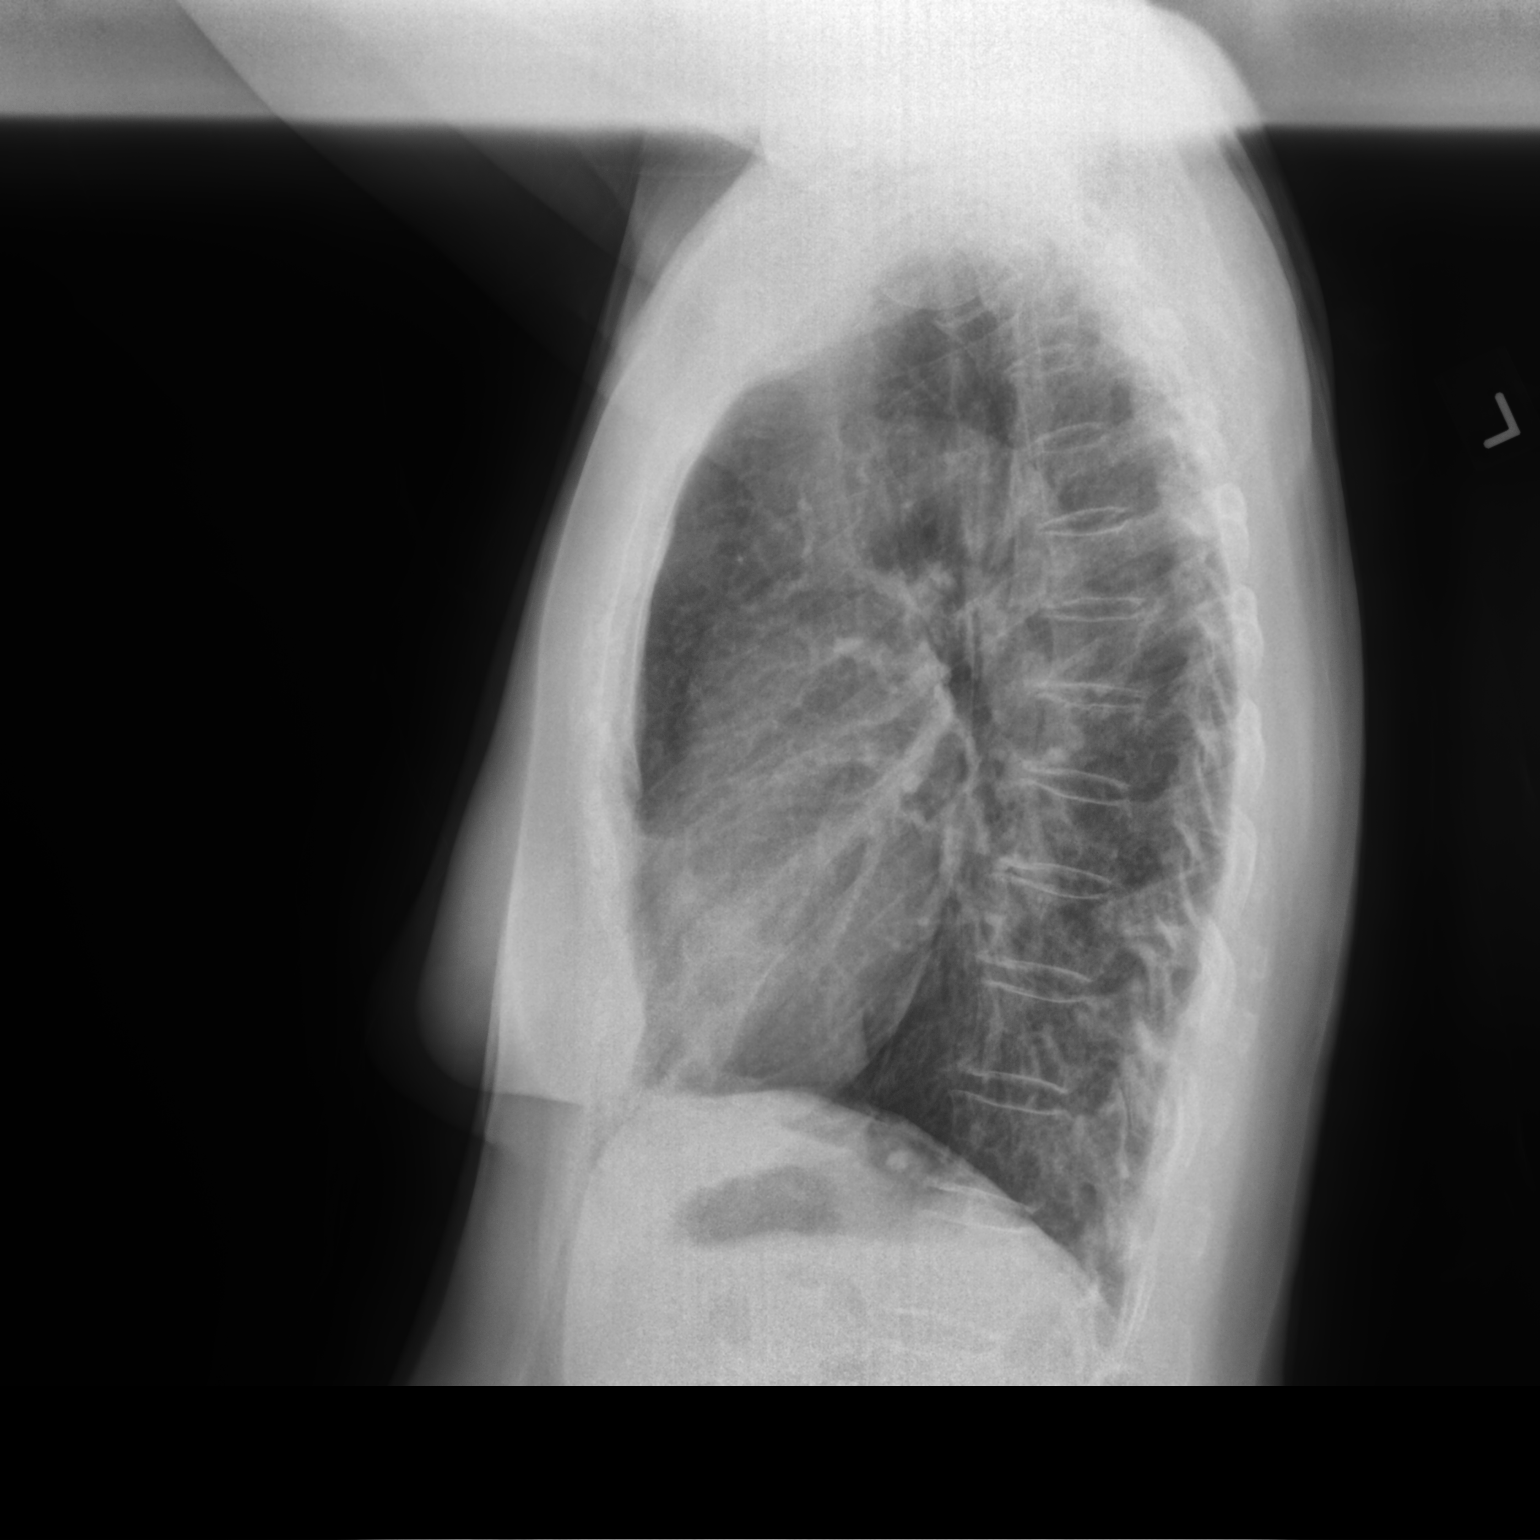

[2 of 2 positions shown; findings below may reference images not displayed]

FINDINGS: The cardiomediastinal silhouette is normal. No pneumothorax. Mild
haziness in the right middle lobe, similar since [DATE]. Mild
haziness in the lingula, also similar since [DATE]. No
definitive new infiltrates identified.
IMPRESSION: No convincing evidence of new infiltrate. Mild opacities in the
right middle lobe and lingula are stable since [DATE], likely
atelectasis or scar.

## 2021-06-30 NOTE — Assessment & Plan Note (Signed)
Onset cough 2017  Trial of ppi bid 12/05/2017  - Allergy profile 12/05/2017 >  Eos 0.2 /  IgE  695 RAST Pos ragweed/ dust  - Sinus CT 12/13/2017 > ok  - HRCT chest 12/13/2017 >>>  Moderate cylindrical and varicoid bronchiectasis scattered throughout both lungs involving all lung lobes, with associated patchy tree-in-bud opacities. Findings could be due to atypical mycobacterial infection (MAI) or recurrent aspiration. 2. Thick irregular bandlike focus of peribronchovascular consolidation in the basilar right lower lobe, favor evolving postinfectious/postinflammatory scarring.   - Alpha one Screen 12/05/17 :  MM  Level 165  - Quant Ig's 01/16/2018 > nl  - flutter valve training 01/16/2018  - Spirometry 02/28/2018  FEV1 1.35 (67%)  Ratio 67  With truncated upper portion of f/v loop -  12/2017 rx zpak cycles  > stopped working winter 2021 assoc with nasal watery drainage so d/c'd and rec sputum sample with next flare then ? Try doxy? As can't take sulfa or pcn   Has not had another flare of bronchiectasis since rec collecting sputum and not limited by airflow obst so nothing else to suggest for now x reviewed again the action plan to immendiately increase ppi to bid ac and use mucinex for increase cough and sputum specimen if possible for def change to darker colors or any bloody mucus         Each maintenance medication was reviewed in detail including emphasizing most importantly the difference between maintenance and prns and under what circumstances the prns are to be triggered using an action plan format where appropriate.  Total time for H and P, chart review, counseling, reviewing action plan/ flutter  device(s) and generating customized AVS unique to this office visit / same day charting = 

## 2021-06-30 NOTE — Patient Instructions (Signed)
For worse cough remember to take prilosec 20 mg Take 30- 60 min before your first and last meals of the day and use mucinex or mucinex up 1200 mg every 12 hours  Please remember to go to the  x-ray department  for your tests - we will call you with the results when they are available    Please schedule a follow up visit in 12 months but call sooner if needed.

## 2021-06-30 NOTE — Progress Notes (Signed)
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 no trouble at all but in the 1990s  developed rhinitis  On shots from [place] and seemed to help p many years and did fine s needing any meds but developed cough around 2017 > [name] eval pos allergy to dust / ragweed and felt it was reflux rx but never took the meds then Jan 2018 cough got worse and dx with RLL air space dx better with abx but never gone and then again cough flared worse Jan 2019 so referred to pulmonary clinic 12/05/2017 by Dr Shaune Pollack     History of Present Illness  12/05/2017  Initial office eval/ Isla Sabree on Prilosec 20 mg daily 30 min ac since Dec 2018  Chief Complaint  Patient presents with   Pulmonary Consult    Referred by Dr. Kevan Ny. Pt c/o cough off and on since she had PNA in Jan 2018- worse since Dec 2019.  Her cough is occ prod with clear to yellow sputum.    each time dx as pneumonia  mucus did change to darker but never cleared completely neither clinically nor radiographically  Dyspnea:  Not limited by breathing from desired activities   Cough: worse when leaning back to read at hs  Sleep: ok at 30 degrees / some fare in am of cough  rec Most likely diagnosis is Bronchiectasis = Bronchiectasis =   you have scarring of your bronchial tubes which means that they don't function perfectly normally and mucus tends to pool in certain areas of your lung which can cause pneumonia and further scarring of your lung and bronchial tubes Continue Priloscec 20 mg Take 30- 60 min before your first and last meals of the day  GERD diet  Please see patient coordinator before you leave today  to schedule a CT sinus and HRCT Chest CT  Please remember to go to the lab department downstairs in the basement  for your tests - we will call you with the results when they are available.    01/16/2018  f/u ov/Marilynn Ekstein re: bronchiectasis  Chief Complaint  Patient  presents with   Follow-up    Pt states she had been doing well since last visit up until this weekend, 8/24-8/25 and her cough was so bad she got to where she could not sleep. Pt states her throat became so raw from coughing and she also got to the point she even used cough drops. Pt states her cough was productive and was coughing up clear to green mucus and also had c/o wheezing.. Denies any SOB or CP/chest tightness.  Dyspnea:  MMRC1 = can walk nl pace, flat grade, can't hurry or go uphills or steps s sob   Cough: much worse x 48 h/ does not remember instructions on how to manage from last ov / given in writing/ starting to turn slightly purulent  Sleeping: typically sleeps fine / 30 degrees  SABA use: none  02: none   rec For cough >  mucinex dm 1200 mg every 12 hours with glass and use the flutter valve as much as possible For nasty mucus Take a zpak  GERD  You need Prevnar 13- check with Dr Kevan Ny asap  Please schedule a follow up office visit in 6 weeks, call sooner if needed with pfts on return      NP  eval  10/04/19 unable to use flutter appropriately and zmax not effective  rec sputum sample > not obtained as rec     06/24/2020  f/u ov/Deniro Laymon re: bronchiectasis Chief Complaint  Patient presents with   Follow-up    Breathing is doing well and not coughing much. No new co's.   Dyspnea:  Not limited by breathing from desired activities   Cough: none, even in am / using flutter  Sleeping: bed blocks / 2 pillows  SABA use: none  02: none   Rec Flutter valve helps cough do it's job by keeping the airway open during coughing  We need to get a sputum specimen the next time your mucus turns darker yellow or brown or bloody. Ok to stop the prilosec before supper but at onset of cough immediately restart the dose your were taking prilosec again 30 min before supper     06/30/2021  f/u ov/Osric Klopf re: bronchiectasis  maint on prilosec 20 mg q am  no recent flares  No chief complaint on  file. Dyspnea:  Not limited by breathing from desired activities   Cough: sporadic / using flutter doesn't think it really helps, am mucus minimally yellow  Sleeping: bed blocks  SABA use: none  02: none  Covid status:  vax x 4 including bivalent    No obvious day to day or daytime variability or assoc mucus plugs or hemoptysis or cp or chest tightness, subjective wheeze or overt sinus or hb symptoms.   Sleeping  without nocturnal  or early am exacerbation  of respiratory  c/o's or need for noct saba. Also denies any obvious fluctuation of symptoms with weather or environmental changes or other aggravating or alleviating factors except as outlined above   No unusual exposure hx or h/o childhood pna/ asthma or knowledge of premature birth.  Current Allergies, Complete Past Medical History, Past Surgical History, Family History, and Social History were reviewed in [employer] record.  ROS  The following are not active complaints unless bolded Hoarseness, sore throat, dysphagia, dental problems, itching, sneezing,  nasal congestion or discharge of excess mucus or purulent secretions, ear ache,   fever, chills, sweats, unintended wt loss or wt gain, classically pleuritic or exertional cp,  orthopnea pnd or arm/hand swelling  or leg swelling, presyncope, palpitations, abdominal pain, anorexia, nausea, vomiting, diarrhea  or change in bowel habits or change in bladder habits, change in stools or change in urine, dysuria, hematuria,  rash, arthralgias, visual complaints, headache, numbness, weakness or ataxia or problems with walking or coordination,  change in mood or  memory.        Current Meds  Medication Sig   amLODipine (NORVASC) 5 MG tablet Take 5 mg by mouth daily.   CALCIUM PO Take 600 mg by mouth 2 (two) times daily.    Carboxymethylcellul-Glycerin (REFRESH OPTIVE OP) Place 1 drop into both eyes 2 (two) times daily.   clobetasol (TEMOVATE) 0.05 % external solution  As needed   hydrocortisone valerate cream (WESTCORT) 0.2 % Apply 1 application topically as needed.    metroNIDAZOLE (METROCREAM) 0.75 % cream Apply 1 application topically as needed (for rosacea).    MULTIPLE MINERALS PO Take 1 capsule by mouth daily.   omeprazole (PRILOSEC) 20 MG capsule Take 20 mg by mouth daily.   Respiratory Therapy Supplies (FLUTTER) DEVI 1 Device by Does not apply route as needed.   TURMERIC PO Take 1 capsule by mouth daily.   zoledronic acid (RECLAST) 5 MG/100ML SOLN injection  Inject 5 mg into the vein once. Annually   [DISCONTINUED] potassium chloride (MICRO-K) 10 MEQ CR capsule Take 20 mEq by mouth 2 (two) times daily.                     Objective:        06/30/2021           133  06/24/2020          140 12/06/2019        141  12/12/2018        141  06/12/2018        143   02/28/2018      139   01/16/18 138 lb 9.6 oz (62.9 kg)  12/05/17 139 lb (63 kg)  07/17/14 141 lb (64 kg)    Vital signs reviewed  06/30/2021  - Note at rest 02 sats  98% on RA   General appearance:    amb pleasantly anxious thin wf nad   HEENT : pt wearing mask not removed for exam due to covid -19 concerns.    NECK :  without JVD/Nodes/TM/ nl carotid upstrokes bilaterally   LUNGS: no acc muscle use,  Nl contour chest which is clear to A and P bilaterally without cough on insp or exp maneuvers   CV:  RRR  no s3 or murmur or increase in P2, and no edema   ABD:  soft and nontender with nl inspiratory excursion in the supine position. No bruits or organomegaly appreciated, bowel sounds nl  MS:  Nl gait/ ext warm without deformities, calf tenderness, cyanosis or clubbing No obvious joint restrictions   SKIN: warm and dry without lesions    NEURO:  alert, approp, nl sensorium with  no motor or cerebellar deficits apparent.   CXR PA and Lateral:   06/30/2021 :    I personally reviewed images and impression is as follows:     C/w bronchiectasis s as dz              Assessment

## 2021-08-10 DIAGNOSIS — R5383 Other fatigue: Secondary | ICD-10-CM | POA: Diagnosis not present

## 2021-08-10 DIAGNOSIS — K219 Gastro-esophageal reflux disease without esophagitis: Secondary | ICD-10-CM | POA: Diagnosis not present

## 2021-08-10 DIAGNOSIS — D473 Essential (hemorrhagic) thrombocythemia: Secondary | ICD-10-CM | POA: Diagnosis not present

## 2021-08-10 DIAGNOSIS — I10 Essential (primary) hypertension: Secondary | ICD-10-CM | POA: Diagnosis not present

## 2021-08-10 DIAGNOSIS — R7989 Other specified abnormal findings of blood chemistry: Secondary | ICD-10-CM | POA: Diagnosis not present

## 2021-10-23 DIAGNOSIS — M1611 Unilateral primary osteoarthritis, right hip: Secondary | ICD-10-CM | POA: Diagnosis not present

## 2021-10-23 DIAGNOSIS — M25562 Pain in left knee: Secondary | ICD-10-CM | POA: Diagnosis not present

## 2021-10-23 DIAGNOSIS — M25561 Pain in right knee: Secondary | ICD-10-CM | POA: Diagnosis not present

## 2021-11-02 DIAGNOSIS — L218 Other seborrheic dermatitis: Secondary | ICD-10-CM | POA: Diagnosis not present

## 2021-11-02 DIAGNOSIS — L718 Other rosacea: Secondary | ICD-10-CM | POA: Diagnosis not present

## 2021-11-02 DIAGNOSIS — L820 Inflamed seborrheic keratosis: Secondary | ICD-10-CM | POA: Diagnosis not present

## 2021-12-08 DIAGNOSIS — M25511 Pain in right shoulder: Secondary | ICD-10-CM | POA: Diagnosis not present

## 2021-12-08 DIAGNOSIS — M25512 Pain in left shoulder: Secondary | ICD-10-CM | POA: Diagnosis not present

## 2021-12-18 ENCOUNTER — Other Ambulatory Visit: Payer: Self-pay | Admitting: Family Medicine

## 2021-12-18 DIAGNOSIS — Z1231 Encounter for screening mammogram for malignant neoplasm of breast: Secondary | ICD-10-CM

## 2021-12-28 DIAGNOSIS — J3089 Other allergic rhinitis: Secondary | ICD-10-CM | POA: Diagnosis not present

## 2021-12-28 DIAGNOSIS — L299 Pruritus, unspecified: Secondary | ICD-10-CM | POA: Diagnosis not present

## 2022-01-04 ENCOUNTER — Ambulatory Visit
Admission: RE | Admit: 2022-01-04 | Discharge: 2022-01-04 | Disposition: A | Discharge status: Home / Self Care | Payer: Medicare PPO | Source: Ambulatory Visit | Attending: Family Medicine | Admitting: Family Medicine

## 2022-01-04 DIAGNOSIS — Z1231 Encounter for screening mammogram for malignant neoplasm of breast: Secondary | ICD-10-CM | POA: Diagnosis not present

## 2022-03-05 DIAGNOSIS — Z79899 Other long term (current) drug therapy: Secondary | ICD-10-CM | POA: Diagnosis not present

## 2022-03-05 DIAGNOSIS — E7800 Pure hypercholesterolemia, unspecified: Secondary | ICD-10-CM | POA: Diagnosis not present

## 2022-03-05 DIAGNOSIS — I10 Essential (primary) hypertension: Secondary | ICD-10-CM | POA: Diagnosis not present

## 2022-03-09 DIAGNOSIS — Z23 Encounter for immunization: Secondary | ICD-10-CM | POA: Diagnosis not present

## 2022-03-09 DIAGNOSIS — Z0000 Encounter for general adult medical examination without abnormal findings: Secondary | ICD-10-CM | POA: Diagnosis not present

## 2022-03-09 DIAGNOSIS — M8580 Other specified disorders of bone density and structure, unspecified site: Secondary | ICD-10-CM | POA: Diagnosis not present

## 2022-03-09 DIAGNOSIS — K219 Gastro-esophageal reflux disease without esophagitis: Secondary | ICD-10-CM | POA: Diagnosis not present

## 2022-03-09 DIAGNOSIS — I10 Essential (primary) hypertension: Secondary | ICD-10-CM | POA: Diagnosis not present

## 2022-03-09 DIAGNOSIS — E7800 Pure hypercholesterolemia, unspecified: Secondary | ICD-10-CM | POA: Diagnosis not present

## 2022-03-09 DIAGNOSIS — D751 Secondary polycythemia: Secondary | ICD-10-CM | POA: Diagnosis not present

## 2022-03-30 ENCOUNTER — Other Ambulatory Visit (HOSPITAL_BASED_OUTPATIENT_CLINIC_OR_DEPARTMENT_OTHER): Payer: Self-pay

## 2022-03-30 MED ORDER — COMIRNATY 30 MCG/0.3ML IM SUSY
PREFILLED_SYRINGE | INTRAMUSCULAR | 0 refills | Status: AC
  Filled 2022-03-30: qty 0.3, 1d supply, fill #0

## 2022-04-13 DIAGNOSIS — R29890 Loss of height: Secondary | ICD-10-CM | POA: Diagnosis not present

## 2022-04-13 DIAGNOSIS — M816 Localized osteoporosis [Lequesne]: Secondary | ICD-10-CM | POA: Diagnosis not present

## 2022-04-13 DIAGNOSIS — Z124 Encounter for screening for malignant neoplasm of cervix: Secondary | ICD-10-CM | POA: Diagnosis not present

## 2022-04-13 DIAGNOSIS — Z1151 Encounter for screening for human papillomavirus (HPV): Secondary | ICD-10-CM | POA: Diagnosis not present

## 2022-04-13 DIAGNOSIS — Z6823 Body mass index (BMI) 23.0-23.9, adult: Secondary | ICD-10-CM | POA: Diagnosis not present

## 2022-04-13 DIAGNOSIS — Z7983 Long term (current) use of bisphosphonates: Secondary | ICD-10-CM | POA: Diagnosis not present

## 2022-04-13 DIAGNOSIS — N958 Other specified menopausal and perimenopausal disorders: Secondary | ICD-10-CM | POA: Diagnosis not present

## 2022-04-19 DIAGNOSIS — D751 Secondary polycythemia: Secondary | ICD-10-CM | POA: Diagnosis not present

## 2022-05-04 ENCOUNTER — Telehealth: Payer: Self-pay | Admitting: Hematology and Oncology

## 2022-05-04 NOTE — Telephone Encounter (Signed)
Scheduled appointment per referral. Patient is aware of appointment date and time. Patient is aware to arrive 15 mins prior to appointment time and to bring updated insurance cards. Patient is aware of location.   

## 2022-05-26 ENCOUNTER — Telehealth: Payer: Self-pay | Admitting: Hematology and Oncology

## 2022-05-26 NOTE — Telephone Encounter (Signed)
Cancelled appointments per scheduling message. At this time, patient does not want to reschedule appointments.

## 2022-05-31 ENCOUNTER — Inpatient Hospital Stay: Payer: Medicare PPO | Admitting: Hematology and Oncology

## 2022-05-31 ENCOUNTER — Inpatient Hospital Stay: Payer: Medicare PPO

## 2022-06-26 NOTE — Progress Notes (Deleted)
Connie Lindsey, female    DOB: [birth year],    MRN: [id number]    Brief patient profile:  33 yowf quit smoking 1982 due to IUP no trouble at all but in the 1990s  developed rhinitis  On shots from [facility] and seemed to help p many years and did fine s needing any meds but developed cough around 2017 > [name] eval pos allergy to dust / ragweed and felt it was reflux rx but never took the meds then Jan 2018 cough got worse and dx with RLL air space dx better with abx but never gone and then again cough flared worse Jan 2019 so referred to pulmonary clinic 12/05/2017 by Dr Darcus Austin     History of Present Illness  12/05/2017  Initial office eval/ Sheela Mcculley on Prilosec 20 mg daily 30 min ac since Dec 2018  Chief Complaint  Patient presents with   Pulmonary Consult    Referred by Dr. Inda Merlin. Pt c/o cough off and on since she had PNA in Jan 2018- worse since Dec 2019.  Her cough is occ prod with clear to yellow sputum.    each time dx as pneumonia  mucus did change to darker but never cleared completely neither clinically nor radiographically  Dyspnea:  Not limited by breathing from desired activities   Cough: worse when leaning back to read at hs  Sleep: ok at 30 degrees / some fare in am of cough  rec Most likely diagnosis is Bronchiectasis = Bronchiectasis =   you have scarring of your bronchial tubes which means that they don't function perfectly normally and mucus tends to pool in certain areas of your lung which can cause pneumonia and further scarring of your lung and bronchial tubes Continue Priloscec 20 mg Take 30- 60 min before your first and last meals of the day  GERD diet  Please see patient coordinator before you leave today  to schedule a CT sinus and HRCT Chest CT  Please remember to go to the lab department downstairs in the basement  for your tests - we will call you with the results when they are available.    01/16/2018  f/u ov/Kenyanna Grzesiak re: bronchiectasis  Chief Complaint  Patient  presents with   Follow-up    Pt states she had been doing well since last visit up until this weekend, 8/24-8/25 and her cough was so bad she got to where she could not sleep. Pt states her throat became so raw from coughing and she also got to the point she even used cough drops. Pt states her cough was productive and was coughing up clear to green mucus and also had c/o wheezing.. Denies any SOB or CP/chest tightness.  Dyspnea:  MMRC1 = can walk nl pace, flat grade, can't hurry or go uphills or steps s sob   Cough: much worse x 48 h/ does not remember instructions on how to manage from last ov / given in writing/ starting to turn slightly purulent  Sleeping: typically sleeps fine / 30 degrees  SABA use: none  02: none   rec For cough >  mucinex dm 1200 mg every 12 hours with glass and use the flutter valve as much as possible For nasty mucus Take a zpak  GERD  You need Prevnar 13- check with Dr Inda Merlin asap  Please schedule a follow up office visit in 6 weeks, call sooner if needed with pfts on return      NP  eval  10/04/19 unable to use flutter appropriately and zmax not effective  rec sputum sample > not obtained as rec     06/24/2020  f/u ov/Dushaun Okey re: bronchiectasis Chief Complaint  Patient presents with   Follow-up    Breathing is doing well and not coughing much. No new co's.   Dyspnea:  Not limited by breathing from desired activities   Cough: none, even in am / using flutter  Sleeping: bed blocks / 2 pillows  SABA use: none  02: none   Rec Flutter valve helps cough do it's job by keeping the airway open during coughing  We need to get a sputum specimen the next time your mucus turns darker yellow or brown or bloody. Ok to stop the prilosec before supper but at onset of cough immediately restart the dose your were taking prilosec again 30 min before supper     06/30/2021  f/u ov/Rutherford Alarie re: bronchiectasis  maint on prilosec 20 mg q am  no recent flares  No chief complaint on  file. Dyspnea:  Not limited by breathing from desired activities   Cough: sporadic / using flutter doesn't think it really helps, am mucus minimally yellow  Sleeping: bed blocks  SABA use: none  02: none  Covid status:  vax x 4 including bivalent  rec   06/28/2022  f/u ov/Shenae Bonanno re: ***   maint on ***  No chief complaint on file.   Dyspnea:  *** Cough: *** Sleeping: *** SABA use: *** 02: *** Covid status:   *** Lung cancer screening :  ***    No obvious day to day or daytime variability or assoc excess/ purulent sputum or mucus plugs or hemoptysis or cp or chest tightness, subjective wheeze or overt sinus or hb symptoms.   *** without nocturnal  or early am exacerbation  of respiratory  c/o's or need for noct saba. Also denies any obvious fluctuation of symptoms with weather or environmental changes or other aggravating or alleviating factors except as outlined above   No unusual exposure hx or h/o childhood pna/ asthma or knowledge of premature birth.  Current Allergies, Complete Past Medical History, Past Surgical History, Family History, and Social History were reviewed in [facility] record.  ROS  The following are not active complaints unless bolded Hoarseness, sore throat, dysphagia, dental problems, itching, sneezing,  nasal congestion or discharge of excess mucus or purulent secretions, ear ache,   fever, chills, sweats, unintended wt loss or wt gain, classically pleuritic or exertional cp,  orthopnea pnd or arm/hand swelling  or leg swelling, presyncope, palpitations, abdominal pain, anorexia, nausea, vomiting, diarrhea  or change in bowel habits or change in bladder habits, change in stools or change in urine, dysuria, hematuria,  rash, arthralgias, visual complaints, headache, numbness, weakness or ataxia or problems with walking or coordination,  change in mood or  memory.        No outpatient medications have been marked as taking for the 06/28/22  encounter (Appointment) with Tanda Rockers, MD.                    Objective:    wts   06/28/2022          ***  06/30/2021          133  06/24/2020          140 12/06/2019        141  12/12/2018        141  06/12/2018        143   02/28/2018      139   01/16/18 138 lb 9.6 oz (62.9 kg)  12/05/17 139 lb (63 kg)  07/17/14 141 lb (64 kg)     Vital signs reviewed  06/28/2022  - Note at rest 02 sats  ***% on ***   General appearance:    ***              Assessment

## 2022-06-28 ENCOUNTER — Ambulatory Visit: Payer: Medicare PPO | Admitting: Internal Medicine

## 2022-07-02 DIAGNOSIS — H903 Sensorineural hearing loss, bilateral: Secondary | ICD-10-CM | POA: Diagnosis not present

## 2022-07-25 NOTE — Progress Notes (Unsigned)
Connie Lindsey, female    DOB: Sep 10, 1945     MRN: [id number]    Brief patient profile:  69  yowf quit smoking 1982 due to IUP no trouble at all but in the 1990s  developed rhinitis  On shots from [facility] and seemed to help p many years and did fine s needing any meds but developed cough around 2017 > [name] eval pos allergy to dust / ragweed and felt it was reflux rx but never took the meds then Jan 2018 cough got worse and dx with RLL air space dx better with abx but never gone and then again cough flared worse Jan 2019 so referred to pulmonary clinic 12/05/2017 by Dr Darcus Austin     History of Present Illness  12/05/2017  Initial office eval/ Connie Lindsey on Prilosec 20 mg daily 30 min ac since Dec 2018  Chief Complaint  Patient presents with   Pulmonary Consult    Referred by Dr. Inda Merlin. Pt c/o cough off and on since she had PNA in Jan 2018- worse since Dec 2019.  Her cough is occ prod with clear to yellow sputum.    each time dx as pneumonia  mucus did change to darker but never cleared completely neither clinically nor radiographically  Dyspnea:  Not limited by breathing from desired activities   Cough: worse when leaning back to read at hs  Sleep: ok at 30 degrees / some fare in am of cough  rec Most likely diagnosis is Bronchiectasis = Bronchiectasis =   you have scarring of your bronchial tubes which means that they don't function perfectly normally and mucus tends to pool in certain areas of your lung which can cause pneumonia and further scarring of your lung and bronchial tubes Continue Priloscec 20 mg Take 30- 60 min before your first and last meals of the day  GERD diet  Please see patient coordinator before you leave today  to schedule a CT sinus and HRCT Chest CT  Please remember to go to the lab department downstairs in the basement  for your tests - we will call you with the results when they are available.   NP  eval 10/04/19 unable to use flutter appropriately and zmax not  effective  rec sputum sample > not obtained as rec     07/26/2022  f/u ov/Connie Lindsey re: bronchiectasis maint on omeprzole 20 mg q am / flutter couple x per day/ no recent doxy    Chief Complaint  Patient presents with   Follow-up    Breathing is overall doing well. She has occ cough, flutter valve helps. She is producing some yellow sputum.   Dyspnea:  not limited, plenty of  walkng  Cough: no excess /purulent sputum  Sleeping: bed blocks/ mattress cushion and 2 pillows  SABA [id number] 02: none  Covid status:   vax max      No obvious day to day or daytime variability or assoc  mucus plugs or hemoptysis or cp or chest tightness, subjective wheeze or overt sinus or hb symptoms.   Sleeping as above without nocturnal  or early am exacerbation  of respiratory  c/o's or need for noct saba. Also denies any obvious fluctuation of symptoms with weather or environmental changes or other aggravating or alleviating factors except as outlined above   No unusual exposure hx or h/o childhood pna/ asthma or knowledge of premature birth.  Current Allergies, Complete Past Medical History, Past Surgical History, Family History, and Social History  were reviewed in [facility] record.  ROS  The following are not active complaints unless bolded Hoarseness, sore throat, dysphagia, dental problems, itching, sneezing,  nasal congestion or discharge of excess mucus or purulent secretions, ear ache,   fever, chills, sweats, unintended wt loss or wt gain, classically pleuritic or exertional cp,  orthopnea pnd or arm/hand swelling  or leg swelling, presyncope, palpitations, abdominal pain, anorexia, nausea, vomiting, diarrhea  or change in bowel habits or change in bladder habits, change in stools or change in urine, dysuria, hematuria,  rash, arthralgias, visual complaints, headache, numbness, weakness or ataxia or problems with walking or coordination,  change in mood or  memory.        Current  Meds  Medication Sig   amLODipine (NORVASC) 5 MG tablet Take 5 mg by mouth daily.   CALCIUM PO Take 600 mg by mouth 2 (two) times daily.    Carboxymethylcellul-Glycerin (REFRESH OPTIVE OP) Place 1 drop into both eyes 2 (two) times daily.   clobetasol (TEMOVATE) 0.05 % external solution As needed   COVID-19 mRNA vaccine 2023-2024 (COMIRNATY) syringe Inject into the muscle.   hydrocortisone valerate cream (WESTCORT) 0.2 % Apply 1 application topically as needed.    metroNIDAZOLE (METROCREAM) 0.75 % cream Apply 1 application topically as needed (for rosacea).    MULTIPLE MINERALS PO Take 1 capsule by mouth daily.   omeprazole (PRILOSEC) 20 MG capsule Take 20 mg by mouth daily.   Respiratory Therapy Supplies (FLUTTER) DEVI 1 Device by Does not apply route as needed.                    Objective:    wts   07/26/2022         138  06/30/2021          133  06/24/2020          140 12/06/2019        141  12/12/2018        141  06/12/2018        143   02/28/2018      139   01/16/18 138 lb 9.6 oz (62.9 kg)  12/05/17 139 lb (63 kg)  07/17/14 141 lb (64 kg)     Vital signs reviewed  07/26/2022  - Note at rest 02 sats  98% on RA   General appearance:    pleasant amb wf nad   HEENT : Oropharynx  cxr      NECK :  without  apparent JVD/ palpable Nodes/TM    LUNGS: no acc muscle use,  Nl contour chest which is clear to A and P bilaterally without cough on insp or exp maneuvers   CV:  RRR  no s3 or murmur or increase in P2, and no edema   ABD:  soft and nontender with nl inspiratory excursion in the supine position. No bruits or organomegaly appreciated   MS:  Nl gait/ ext warm without deformities Or obvious joint restrictions  calf tenderness, cyanosis or clubbing    SKIN: warm and dry without lesions    NEURO:  alert, approp, nl sensorium with  no motor or cerebellar deficits apparent.        CXR PA and Lateral:   07/26/2022 :    I personally reviewed images and impression is as follows:      Minimal increased markings esp RML./ no significant worsening vs priors         Assessment

## 2022-07-26 ENCOUNTER — Encounter: Payer: Self-pay | Admitting: Internal Medicine

## 2022-07-26 ENCOUNTER — Ambulatory Visit: Payer: Medicare PPO | Admitting: Internal Medicine

## 2022-07-26 ENCOUNTER — Ambulatory Visit (INDEPENDENT_AMBULATORY_CARE_PROVIDER_SITE_OTHER): Payer: Medicare PPO

## 2022-07-26 VITALS — BP 142/76 | HR 83 | Temp 97.6°F | Ht 63.25 in | Wt 138.8 lb

## 2022-07-26 DIAGNOSIS — J479 Bronchiectasis, uncomplicated: Secondary | ICD-10-CM

## 2022-07-26 MED ORDER — DOXYCYCLINE HYCLATE 100 MG PO TABS: 100.0000 mg | ORAL_TABLET | Freq: Two times a day (BID) | ORAL | 11 refills | Status: DC

## 2022-07-26 NOTE — Patient Instructions (Addendum)
Please remember to go to the  x-ray department  for your tests - we will call you with the results when they are available     Call if mucus gets nasty > go doxycline 100 mg twice daily x 10 days take with glass of water before meals> call me if it doesn't clear up    Please schedule a follow up visit in  12  months but call sooner if needed

## 2022-07-26 NOTE — Assessment & Plan Note (Addendum)
Onset cough 2017  Trial of ppi bid 12/05/2017  - Allergy profile 12/05/2017 >  Eos 0.2 /  IgE  695 RAST Pos ragweed/ dust  - Sinus CT 12/13/2017 > ok  - HRCT chest 12/13/2017 >>>  Moderate cylindrical and varicoid bronchiectasis scattered throughout both lungs involving all lung lobes, with associated patchy tree-in-bud opacities. Findings could be due to atypical mycobacterial infection (MAI) or recurrent aspiration. 2. Thick irregular bandlike focus of peribronchovascular consolidation in the basilar right lower lobe, favor evolving postinfectious/postinflammatory scarring.   - Alpha one Screen 12/05/17 :  MM  Level 165  - Quant Ig's 01/16/2018 > nl  - flutter valve training 01/16/2018  - Spirometry 02/28/2018  FEV1 1.35 (67%)  Ratio 67  With truncated upper portion of f/v loop -  12/2017 rx zpak cycles  > stopped working winter 2021 assoc with nasal watery drainage so d/c'd and rec sputum sample with next flare then ? Try doxy? As can't take sulfa or pcn  No evidence of progression either clinically or radiographically so no change in rx with flutter valve and doxy prn purulent sputum.  F/u can be yearly, sooner prn         Each maintenance medication was reviewed in detail including emphasizing most importantly the difference between maintenance and prns and under what circumstances the prns are to be triggered using an action plan format where appropriate.  Total time for H and P, chart review, counseling, reviewing flutter valve  device(s) and generating customized AVS unique to this office visit / same day charting = 23 min

## 2022-09-13 DIAGNOSIS — K2100 Gastro-esophageal reflux disease with esophagitis, without bleeding: Secondary | ICD-10-CM | POA: Diagnosis not present

## 2022-09-13 DIAGNOSIS — J479 Bronchiectasis, uncomplicated: Secondary | ICD-10-CM | POA: Diagnosis not present

## 2022-09-13 DIAGNOSIS — M1990 Unspecified osteoarthritis, unspecified site: Secondary | ICD-10-CM | POA: Diagnosis not present

## 2022-09-13 DIAGNOSIS — D582 Other hemoglobinopathies: Secondary | ICD-10-CM | POA: Diagnosis not present

## 2022-09-13 DIAGNOSIS — I10 Essential (primary) hypertension: Secondary | ICD-10-CM | POA: Diagnosis not present

## 2022-09-13 DIAGNOSIS — D473 Essential (hemorrhagic) thrombocythemia: Secondary | ICD-10-CM | POA: Diagnosis not present

## 2022-09-30 ENCOUNTER — Encounter: Payer: Medicare PPO | Admitting: Hematology and Oncology

## 2022-09-30 ENCOUNTER — Other Ambulatory Visit: Payer: Medicare PPO

## 2022-10-12 ENCOUNTER — Other Ambulatory Visit: Payer: Self-pay

## 2022-10-12 ENCOUNTER — Inpatient Hospital Stay: Payer: Medicare PPO

## 2022-10-12 ENCOUNTER — Other Ambulatory Visit: Payer: Self-pay | Admitting: *Deleted

## 2022-10-12 ENCOUNTER — Inpatient Hospital Stay: Payer: Medicare PPO | Attending: Hematology and Oncology | Admitting: Hematology and Oncology

## 2022-10-12 VITALS — BP 147/67 | HR 80 | Temp 97.5°F | Resp 16 | Ht 63.25 in | Wt 137.0 lb

## 2022-10-12 DIAGNOSIS — D473 Essential (hemorrhagic) thrombocythemia: Secondary | ICD-10-CM | POA: Diagnosis not present

## 2022-10-12 DIAGNOSIS — I10 Essential (primary) hypertension: Secondary | ICD-10-CM | POA: Insufficient documentation

## 2022-10-12 DIAGNOSIS — C44319 Basal cell carcinoma of skin of other parts of face: Secondary | ICD-10-CM | POA: Diagnosis not present

## 2022-10-12 DIAGNOSIS — D75839 Thrombocytosis, unspecified: Secondary | ICD-10-CM

## 2022-10-12 DIAGNOSIS — Z85828 Personal history of other malignant neoplasm of skin: Secondary | ICD-10-CM | POA: Diagnosis not present

## 2022-10-12 DIAGNOSIS — C44311 Basal cell carcinoma of skin of nose: Secondary | ICD-10-CM | POA: Diagnosis not present

## 2022-10-12 DIAGNOSIS — Z87891 Personal history of nicotine dependence: Secondary | ICD-10-CM | POA: Insufficient documentation

## 2022-10-12 DIAGNOSIS — Z803 Family history of malignant neoplasm of breast: Secondary | ICD-10-CM | POA: Insufficient documentation

## 2022-10-12 LAB — COMPREHENSIVE METABOLIC PANEL
ALT: 22 U/L (ref 0–44)
AST: 24 U/L (ref 15–41)
Albumin: 4.2 g/dL (ref 3.5–5.0)
Alkaline Phosphatase: 95 U/L (ref 38–126)
Anion gap: 10 (ref 5–15)
BUN: 17 mg/dL (ref 8–23)
CO2: 25 mmol/L (ref 22–32)
Calcium: 9.2 mg/dL (ref 8.9–10.3)
Chloride: 101 mmol/L (ref 98–111)
Creatinine, Ser: 0.68 mg/dL (ref 0.44–1.00)
GFR, Estimated: >60 mL/min (ref >60)
Glucose, Bld: 88 mg/dL (ref 70–99)
Potassium: 3.6 mmol/L (ref 3.5–5.1)
Sodium: 136 mmol/L (ref 135–145)
Total Bilirubin: 0.5 mg/dL (ref 0.3–1.2)
Total Protein: 8.6 g/dL — ABNORMAL HIGH (ref 6.5–8.1)

## 2022-10-12 LAB — CBC WITH DIFFERENTIAL/PLATELET
Abs Immature Granulocytes: 0.04 10*3/uL (ref 0.00–0.07)
Basophils Absolute: 0.1 10*3/uL (ref 0.0–0.1)
Basophils Relative: 1 %
Eosinophils Absolute: 0.2 10*3/uL (ref 0.0–0.5)
Eosinophils Relative: 2 %
HCT: 37.6 % (ref 36.0–46.0)
Hemoglobin: 13.4 g/dL (ref 12.0–15.0)
Immature Granulocytes: 0 %
Lymphocytes Relative: 27 %
Lymphs Abs: 2.5 10*3/uL (ref 0.7–4.0)
MCH: 32.8 pg (ref 26.0–34.0)
MCHC: 35.6 g/dL (ref 30.0–36.0)
MCV: 92.2 fL (ref 80.0–100.0)
Monocytes Absolute: 1.1 10*3/uL — ABNORMAL HIGH (ref 0.1–1.0)
Monocytes Relative: 12 %
Neutro Abs: 5.3 10*3/uL (ref 1.7–7.7)
Neutrophils Relative %: 58 %
Platelets: 325 10*3/uL (ref 150–400)
RBC: 4.08 MIL/uL (ref 3.87–5.11)
RDW: 14.3 % (ref 11.5–15.5)
WBC: 9.3 10*3/uL (ref 4.0–10.5)
nRBC: 0 % (ref 0.0–0.2)

## 2022-10-12 LAB — VITAMIN B12: Vitamin B-12: 932 pg/mL — ABNORMAL HIGH (ref 180–914)

## 2022-10-12 LAB — IRON AND IRON BINDING CAPACITY (CC-WL,HP ONLY)
Iron: 93 ug/dL (ref 28–170)
Saturation Ratios: 31 % (ref 10.4–31.8)
TIBC: 300 ug/dL (ref 250–450)
UIBC: 207 ug/dL (ref 148–442)

## 2022-10-12 NOTE — Progress Notes (Signed)
[facility] CONSULT NOTE  Patient Care Team: Shon Hale, MD as PCP - General (Family Medicine)  CHIEF COMPLAINTS/PURPOSE OF CONSULTATION:  Thrombocytosis.  ASSESSMENT & PLAN:   This is a very pleasant 77 year old female patient with past medical history significant for hypertension, arthritis referred to hematology for evaluation and recommendations regarding thrombocytosis.  She has had persistent but mild thrombocytosis for the past 2 years approximately.  Prior to that back in 2019 and 2016 her labs were perfectly within normal limits.  About the same time she started noticing generalized pruritus with some relief from creams for eczema.  She never had any history of DVT/PE.  Physical examination today, no palpable lymphadenopathy or hepatosplenomegaly, mild bilateral ankle edema likely from Norvasc.  We have reviewed labs which showed mild but persistent thrombocytosis, no leukocytosis or polycythemia noted except on 1 lab.  No differential available to evaluate for basophilia.  I have discussed about the following findings about thrombocytosis today.  Differential diagnosis  1. Primary thrombocytosis: Related to myeloproliferative disorders of the bone marrow especially essential thrombocytosis and CML. I would like to send for BCR-ABL as well as JAK-2 mutation analysis. Patient understands that JAK2 mutation is only present in 50% of essential thrombocytosis so the test is advantageous only if it is positive. If it is negative, it does not rule out. 2. Secondary/reactive thrombocytosis Different causes including infections, inflammation, iron deficiency.  I would like to send out for iron panel, ferritin, B12, folic acid, ANA today  1.  We have discussed treatment options for primary thrombocytosis which could be agents to lower the platelet count as well as baby aspirin 2. Treatment of secondary thrombocytosis would be to treat underlying cause. There would not be  any risk of thrombosis with secondary thrombocytosis.  She will return to clinic via telephone visit in 2 weeks to review lab results and to discuss additional recommendations.     HISTORY OF PRESENTING ILLNESS:  Connie Lindsey 77 y.o. female is here because of thrombocytosis.  Very pleasant 77 year old female patient with past medical history significant for hypertension, osteoarthritis referred to hematology for evaluation and recommendations regarding persistent thrombocytosis.  [name] today arrived with her husband who is also our patient.  She denies any new health complaints except for well-controlled hypertension now on amlodipine.  She has a new primary care physician and her only new medication is amlodipine she states.  No known iron deficiency or autoimmune diseases.  She has also noticed some pruritus which started essentially about 2 years ago, uses some CeraVe cream which helps some but not always.  Other than the pruritus, she denies any personal history of DVT/PE, erythromelalgia.  She denies any B symptoms.  No change in breathing or bowel habits or urinary habits.  She does not take any baby aspirin.  Rest of the pertinent 10 point ROS reviewed and negative  REVIEW OF SYSTEMS:   Constitutional: Denies fevers, chills or abnormal night sweats Eyes: Denies blurriness of vision, double vision or watery eyes Ears, nose, mouth, throat, and face: Denies mucositis or sore throat Respiratory: Denies cough, dyspnea or wheezes Cardiovascular: Denies palpitation, chest discomfort or lower extremity swelling Gastrointestinal:  Denies nausea, heartburn or change in bowel habits Skin: Denies abnormal skin rashes Lymphatics: Denies new lymphadenopathy or easy bruising Neurological:Denies numbness, tingling or new weaknesses Behavioral/Psych: Mood is stable, no new changes  All other systems were reviewed with the patient and are negative.  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   Procedure: LEFT TOTAL HIP ARTHROPLASTY ANTERIOR APPROACH;  Surgeon: Loanne Drilling, MD;  Location: [facility];  Service: Orthopedics;  Laterality: Left;    SOCIAL HISTORY: Social History   Socioeconomic History   Marital status: Married    Spouse name: Not on file   Number of children: Not on file   Years of education: Not on file   Highest education level: Not on file  Occupational History   Not on file  Tobacco Use   Smoking status: Former    Packs/day: 0.25    Years: 15.00    Additional pack years: 0.00    Total pack years: 3.75    Types: Cigarettes    Quit date: 05/24/1980    Years since quitting: 42.4   Smokeless tobacco: Never  Substance and Sexual Activity   Alcohol use: No   Drug use: No   Sexual activity: Not on file  Other Topics Concern   Not on file  Social History Narrative   Not on file   Social Determinants of Health   Financial Resource Strain: Not on file  Food Insecurity: Not on file  Transportation Needs: Not on file  Physical Activity: Not on file  Stress: Not on file  Social Connections: Not on file  Intimate Partner Violence: Not on file    FAMILY HISTORY: Family History  Problem Relation Age of Onset   Breast cancer Mother     ALLERGIES:  is allergic to benadryl [diphenhydramine], codeine, and tramadol hcl.  MEDICATIONS:  Current Outpatient Medications  Medication Sig Dispense Refill   amLODipine (NORVASC) 5 MG tablet Take 5 mg by mouth daily.     CALCIUM PO Take 600 mg by mouth 2 (two) times daily.      Carboxymethylcellul-Glycerin (REFRESH OPTIVE OP) Place 1 drop into both eyes 2 (two) times  daily.     clobetasol (TEMOVATE) 0.05 % external solution As needed     COVID-19 mRNA vaccine 2023-2024 (COMIRNATY) syringe Inject into the muscle. 0.3 mL 0   doxycycline (VIBRA-TABS) 100 MG tablet Take 1 tablet (100 mg total) by mouth 2 (two) times daily. 20 tablet 11   hydrocortisone valerate cream (WESTCORT) 0.2 % Apply 1 application topically as needed.      metroNIDAZOLE (METROCREAM) 0.75 % cream Apply 1 application topically as needed (for rosacea).      MULTIPLE MINERALS PO Take 1 capsule by mouth daily.     omeprazole (PRILOSEC) 20 MG capsule Take 20 mg by mouth daily.     Respiratory Therapy Supplies (FLUTTER) DEVI 1 Device by Does not apply route as needed. 1 each 0   No current facility-administered medications for this visit.     PHYSICAL EXAMINATION: ECOG PERFORMANCE STATUS: 0 - Asymptomatic  Vitals:   10/12/22 1400  BP: (!) 147/67  Pulse: 80  Resp: 16  Temp: (!) 97.5 F (36.4 C)  SpO2: 96%   Filed Weights   10/12/22 1400  Weight: 137 lb (62.1 kg)    GENERAL:alert, no distress and comfortable SKIN: skin color, texture, turgor are normal, no rashes or significant  lesions EYES: normal, conjunctiva are pink and non-injected, sclera clear OROPHARYNX:no exudate, no erythema and lips, buccal mucosa, and tongue normal  NECK: supple, thyroid normal size, non-tender, without nodularity LYMPH:  no palpable lymphadenopathy in the cervical, axillary  LUNGS: clear to auscultation and percussion with normal breathing effort HEART: regular rate & rhythm and no murmurs and no lower extremity edema ABDOMEN:abdomen soft, non-tender and normal bowel sounds Musculoskeletal:no cyanosis of digits and no clubbing  PSYCH: alert & oriented x 3 with fluent speech NEURO: no focal motor/sensory deficits  LABORATORY DATA:  I have reviewed the data as listed Lab Results  Component Value Date   WBC 6.8 12/05/2017   HGB 14.1 12/05/2017   HCT 39.4 12/05/2017   MCV 89.6 12/05/2017    PLT 395.0 12/05/2017     Chemistry      Component Value Date/Time   NA 133 (L) 07/19/2014 0500   K 3.4 (L) 07/19/2014 0500   CL 100 07/19/2014 0500   CO2 27 07/19/2014 0500   BUN 11 07/19/2014 0500   CREATININE 0.59 07/19/2014 0500      Component Value Date/Time   CALCIUM 8.4 07/19/2014 0500   ALKPHOS 83 07/10/2014 0900   AST 27 07/10/2014 0900   ALT 26 07/10/2014 0900   BILITOT 0.7 07/10/2014 0900       RADIOGRAPHIC STUDIES: I have personally reviewed the radiological images as listed and agreed with the findings in the report. No results found.  All questions were answered. The patient knows to call the clinic with any problems, questions or concerns. I spent 45 minutes in the care of this patient including H and P, review of records, counseling and coordination of care.     Rachel Moulds, MD 10/12/2022 2:35 PM

## 2022-10-13 LAB — FOLATE RBC
Folate, Hemolysate: >620 ng/mL
Folate, RBC: >1542 ng/mL (ref >498)
Hematocrit: 40.2 % (ref 34.0–46.6)

## 2022-10-13 LAB — FERRITIN: Ferritin: 23 ng/mL (ref 11–307)

## 2022-10-14 LAB — ERYTHROPOIETIN: Erythropoietin: 11 m[IU]/mL (ref 2.6–18.5)

## 2022-10-15 LAB — ANTINUCLEAR ANTIBODIES, IFA: ANA Ab, IFA: NEGATIVE

## 2022-10-27 ENCOUNTER — Inpatient Hospital Stay: Payer: Medicare PPO | Attending: Hematology and Oncology | Admitting: Hematology and Oncology

## 2022-10-27 DIAGNOSIS — D75839 Thrombocytosis, unspecified: Secondary | ICD-10-CM

## 2022-10-27 NOTE — Progress Notes (Signed)
[facility] CONSULT NOTE  Patient Care Team: Shon Hale, MD as PCP - General (Family Medicine)  CHIEF COMPLAINTS/PURPOSE OF CONSULTATION:  Thrombocytosis.  ASSESSMENT & PLAN:   This is a very pleasant 77 year old female patient with past medical history significant for hypertension, arthritis referred to hematology for evaluation and recommendations regarding thrombocytosis.   She is here for telephone follow-up for thrombocytosis.  Since her last visit here, she denies any new complaints.  We have reviewed the labs which did not show any evidence of thrombocytosis.  Ferritin low normal hence have advised her to take over-the-counter iron supplementation 65/325 mg of ferrous sulfate every other day for the next 3 to 4 months at least.  ANA negative, no evidence of B12 or folic acid deficiency.  She however mentions to me that since she started taking the baby aspirin, she had quite a bit of relief with the itching.  Hence have recommended that she come back for follow-up in 6 months to review labs once again.  Since the platelet count was normal we did not proceed with the JAK2 testing this time however with normal erythropoietin and fluctuating platelet count it may be less likely to have essential thrombocytosis.  I still encouraged her to continue aspirin if it is benefiting her itching and it may also have some benefit on cardiovascular events.   HISTORY OF PRESENTING ILLNESS:  Connie Lindsey 77 y.o. female is here because of thrombocytosis.  This is a very pleasant 77 year old female patient with past medical history significant for hypertension, osteoarthritis referred to hematology for evaluation and recommendations regarding persistent thrombocytosis.    Connie Lindsey is here for telephone visit, Mr. Bosscher was also present on the phone.  She did not have any issues today.  She says that aspirin has been helping with the itching. Rest of the pertinent 10 point ROS  reviewed and negative   MEDICAL HISTORY:  Past Medical History:  Diagnosis Date   Arthritis    Family history of adverse reaction to anesthesia    father- had fall- hip surgery - memory loss then had pneumonia    GERD (gastroesophageal reflux disease)    Hypertension     SURGICAL HISTORY: Past Surgical History:  Procedure Laterality Date   CESAREAN SECTION     TOTAL HIP ARTHROPLASTY Left 07/17/2014   Procedure: LEFT TOTAL HIP ARTHROPLASTY ANTERIOR APPROACH;  Surgeon: Loanne Drilling, MD;  Location: [facility];  Service: Orthopedics;  Laterality: Left;    SOCIAL HISTORY: Social History   Socioeconomic History   Marital status: Married    Spouse name: Not on file   Number of children: Not on file   Years of education: Not on file   Highest education level: Not on file  Occupational History   Not on file  Tobacco Use   Smoking status: Former    Packs/day: 0.25    Years: 15.00    Additional pack years: 0.00    Total pack years: 3.75    Types: Cigarettes    Quit date: 05/24/1980    Years since quitting: 42.4   Smokeless tobacco: Never  Substance and Sexual Activity   Alcohol use: No   Drug use: No   Sexual activity: Not on file  Other Topics Concern   Not on file  Social History Narrative   Not on file   Social Determinants of Health   Financial Resource Strain: Not on file  Food Insecurity: Not on file  Transportation  Needs: Not on file  Physical Activity: Not on file  Stress: Not on file  Social Connections: Not on file  Intimate Partner Violence: Not on file    FAMILY HISTORY: Family History  Problem Relation Age of Onset   Breast cancer Mother     ALLERGIES:  is allergic to benadryl [diphenhydramine], codeine, and tramadol hcl.  MEDICATIONS:  Current Outpatient Medications  Medication Sig Dispense Refill   amLODipine (NORVASC) 5 MG tablet Take 5 mg by mouth daily.     CALCIUM PO Take 600 mg by mouth 2 (two) times daily.       Carboxymethylcellul-Glycerin (REFRESH OPTIVE OP) Place 1 drop into both eyes 2 (two) times daily.     clobetasol (TEMOVATE) 0.05 % external solution As needed     COVID-19 mRNA vaccine 2023-2024 (COMIRNATY) syringe Inject into the muscle. 0.3 mL 0   doxycycline (VIBRA-TABS) 100 MG tablet Take 1 tablet (100 mg total) by mouth 2 (two) times daily. 20 tablet 11   hydrocortisone valerate cream (WESTCORT) 0.2 % Apply 1 application topically as needed.      metroNIDAZOLE (METROCREAM) 0.75 % cream Apply 1 application topically as needed (for rosacea).      MULTIPLE MINERALS PO Take 1 capsule by mouth daily.     omeprazole (PRILOSEC) 20 MG capsule Take 20 mg by mouth daily.     Respiratory Therapy Supplies (FLUTTER) DEVI 1 Device by Does not apply route as needed. 1 each 0   No current facility-administered medications for this visit.     PHYSICAL EXAMINATION: ECOG PERFORMANCE STATUS: 0 - Asymptomatic  GENERAL:alert, no distress and comfortable SKIN: skin color, texture, turgor are normal, no rashes or significant lesions EYES: normal, conjunctiva are pink and non-injected, sclera clear OROPHARYNX:no exudate, no erythema and lips, buccal mucosa, and tongue normal  NECK: supple, thyroid normal size, non-tender, without nodularity LYMPH:  no palpable lymphadenopathy in the cervical, axillary  LUNGS: clear to auscultation and percussion with normal breathing effort HEART: regular rate & rhythm and no murmurs and no lower extremity edema ABDOMEN:abdomen soft, non-tender and normal bowel sounds Musculoskeletal:no cyanosis of digits and no clubbing  PSYCH: alert & oriented x 3 with fluent speech NEURO: no focal motor/sensory deficits  LABORATORY DATA:  I have reviewed the data as listed Lab Results  Component Value Date   WBC 9.3 10/12/2022   HGB 13.4 10/12/2022   HCT 40.2 10/12/2022   HCT 37.6 10/12/2022   MCV 92.2 10/12/2022   PLT 325 10/12/2022     Chemistry      Component Value  Date/Time   NA 136 10/12/2022 1445   K 3.6 10/12/2022 1445   CL 101 10/12/2022 1445   CO2 25 10/12/2022 1445   BUN 17 10/12/2022 1445   CREATININE 0.68 10/12/2022 1445      Component Value Date/Time   CALCIUM 9.2 10/12/2022 1445   ALKPHOS 95 10/12/2022 1445   AST 24 10/12/2022 1445   ALT 22 10/12/2022 1445   BILITOT 0.5 10/12/2022 1445       RADIOGRAPHIC STUDIES: I have personally reviewed the radiological images as listed and agreed with the findings in the report. No results found.  All questions were answered. The patient knows to call the clinic with any problems, questions or concerns. I spent 14 minutes in the care of this patient including H and P, review of records, counseling and coordination of care.  I connected with  Jamesetta Orleans on 10/27/22 by a telephone application  and verified that I am speaking with the correct person using two identifiers.   I discussed the limitations of evaluation and management by telemedicine. The patient expressed understanding and agreed to proceed.     Rachel Moulds, MD 10/27/2022 4:46 PM

## 2022-10-28 ENCOUNTER — Telehealth: Payer: Self-pay | Admitting: Hematology and Oncology

## 2022-10-28 DIAGNOSIS — C4401 Basal cell carcinoma of skin of lip: Secondary | ICD-10-CM | POA: Diagnosis not present

## 2022-10-28 DIAGNOSIS — Z85828 Personal history of other malignant neoplasm of skin: Secondary | ICD-10-CM | POA: Diagnosis not present

## 2022-10-28 NOTE — Telephone Encounter (Signed)
Spoke with patient confirming upcoming appointment  

## 2022-12-10 ENCOUNTER — Other Ambulatory Visit: Payer: Self-pay | Admitting: Family Medicine

## 2022-12-10 DIAGNOSIS — Z0000 Encounter for general adult medical examination without abnormal findings: Secondary | ICD-10-CM

## 2022-12-29 DIAGNOSIS — B029 Zoster without complications: Secondary | ICD-10-CM | POA: Diagnosis not present

## 2023-01-10 ENCOUNTER — Ambulatory Visit: Payer: Medicare PPO

## 2023-01-11 ENCOUNTER — Ambulatory Visit
Admission: RE | Admit: 2023-01-11 | Discharge: 2023-01-11 | Disposition: A | Discharge status: Home / Self Care | Payer: Medicare PPO | Source: Ambulatory Visit | Attending: Family Medicine | Admitting: Family Medicine

## 2023-01-11 DIAGNOSIS — Z1231 Encounter for screening mammogram for malignant neoplasm of breast: Secondary | ICD-10-CM | POA: Diagnosis not present

## 2023-01-11 DIAGNOSIS — Z0000 Encounter for general adult medical examination without abnormal findings: Secondary | ICD-10-CM

## 2023-03-15 DIAGNOSIS — M8580 Other specified disorders of bone density and structure, unspecified site: Secondary | ICD-10-CM | POA: Diagnosis not present

## 2023-03-15 DIAGNOSIS — D473 Essential (hemorrhagic) thrombocythemia: Secondary | ICD-10-CM | POA: Diagnosis not present

## 2023-03-15 DIAGNOSIS — E7800 Pure hypercholesterolemia, unspecified: Secondary | ICD-10-CM | POA: Diagnosis not present

## 2023-03-15 DIAGNOSIS — D751 Secondary polycythemia: Secondary | ICD-10-CM | POA: Diagnosis not present

## 2023-03-15 DIAGNOSIS — Z23 Encounter for immunization: Secondary | ICD-10-CM | POA: Diagnosis not present

## 2023-03-15 DIAGNOSIS — Z0000 Encounter for general adult medical examination without abnormal findings: Secondary | ICD-10-CM | POA: Diagnosis not present

## 2023-03-15 DIAGNOSIS — K219 Gastro-esophageal reflux disease without esophagitis: Secondary | ICD-10-CM | POA: Diagnosis not present

## 2023-03-15 DIAGNOSIS — I10 Essential (primary) hypertension: Secondary | ICD-10-CM | POA: Diagnosis not present

## 2023-03-15 DIAGNOSIS — Z1211 Encounter for screening for malignant neoplasm of colon: Secondary | ICD-10-CM | POA: Diagnosis not present

## 2023-04-11 ENCOUNTER — Other Ambulatory Visit (HOSPITAL_BASED_OUTPATIENT_CLINIC_OR_DEPARTMENT_OTHER): Payer: Self-pay

## 2023-04-11 MED ORDER — COVID-19 MRNA VAC-TRIS(PFIZER) 30 MCG/0.3ML IM SUSY
0.3000 mL | PREFILLED_SYRINGE | Freq: Once | INTRAMUSCULAR | 0 refills | Status: AC
  Filled 2023-04-11: qty 0.3, 1d supply, fill #0

## 2023-04-15 ENCOUNTER — Telehealth: Payer: Self-pay | Admitting: Hematology and Oncology

## 2023-04-15 NOTE — Telephone Encounter (Signed)
Spoke with patient confirming upcoming appointment change

## 2023-04-27 ENCOUNTER — Telehealth: Payer: Self-pay | Admitting: Hematology and Oncology

## 2023-04-27 DIAGNOSIS — H04123 Dry eye syndrome of bilateral lacrimal glands: Secondary | ICD-10-CM | POA: Diagnosis not present

## 2023-04-27 DIAGNOSIS — H3500 Unspecified background retinopathy: Secondary | ICD-10-CM | POA: Diagnosis not present

## 2023-04-27 DIAGNOSIS — H43813 Vitreous degeneration, bilateral: Secondary | ICD-10-CM | POA: Diagnosis not present

## 2023-04-27 DIAGNOSIS — H26493 Other secondary cataract, bilateral: Secondary | ICD-10-CM | POA: Diagnosis not present

## 2023-04-27 DIAGNOSIS — H52203 Unspecified astigmatism, bilateral: Secondary | ICD-10-CM | POA: Diagnosis not present

## 2023-04-27 DIAGNOSIS — H35371 Puckering of macula, right eye: Secondary | ICD-10-CM | POA: Diagnosis not present

## 2023-04-29 ENCOUNTER — Other Ambulatory Visit: Payer: Medicare PPO

## 2023-04-29 ENCOUNTER — Ambulatory Visit: Payer: Medicare PPO | Admitting: Hematology and Oncology

## 2023-05-02 ENCOUNTER — Ambulatory Visit: Payer: Medicare PPO | Admitting: Hematology and Oncology

## 2023-05-02 ENCOUNTER — Other Ambulatory Visit: Payer: Medicare PPO

## 2023-07-10 NOTE — Progress Notes (Unsigned)
 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 no trouble at all but in the 1990s  developed rhinitis  On shots from [place] and seemed to help p many years and did fine s needing any meds but developed cough around 2017 > [name] eval pos allergy to dust / ragweed and felt it was reflux rx but never took the meds then Jan 2018 cough got worse and dx with RLL air space dx better with abx but never gone and then again cough flared worse Jan 2019 so referred to pulmonary clinic 12/05/2017 by Dr Shaune Pollack     History of Present Illness  12/05/2017  Initial office eval/ Connie Lindsey on Prilosec 20 mg daily 30 min ac since Dec 2018  Chief Complaint  Patient presents with   Pulmonary Consult    Referred by Dr. Kevan Ny. Pt c/o cough off and on since she had PNA in Jan 2018- worse since Dec 2019.  Her cough is occ prod with clear to yellow sputum.    each time dx as pneumonia  mucus did change to darker but never cleared completely neither clinically nor radiographically  Dyspnea:  Not limited by breathing from desired activities   Cough: worse when leaning back to read at hs  Sleep: ok at 30 degrees / some fare in am of cough  rec Most likely diagnosis is Bronchiectasis = Bronchiectasis =   you have scarring of your bronchial tubes which means that they don't function perfectly normally and mucus tends to pool in certain areas of your lung which can cause pneumonia and further scarring of your lung and bronchial tubes Continue Priloscec 20 mg Take 30- 60 min before your first and last meals of the day  GERD diet  Please see patient coordinator before you leave today  to schedule a CT sinus and HRCT Chest CT  Please remember to go to the lab department downstairs in the basement  for your tests - we will call you with the results when they are available.   NP  eval 10/04/19 unable to use flutter appropriately and zmax not  effective  rec sputum sample > not obtained as rec     07/26/2022  f/u ov/Connie Lindsey re: bronchiectasis maint on omeprzole 20 mg q am / flutter couple x per day/ no recent doxy    Chief Complaint  Patient presents with   Follow-up    Breathing is overall doing well. She has occ cough, flutter valve helps. She is producing some yellow sputum.   Dyspnea:  not limited, plenty of  walkng  Cough: no excess /purulent sputum  Sleeping: bed blocks/ mattress cushion and 2 pillows  SABA WGN:FAOZ 02: none  Rec  Call if mucus gets nasty > go doxycline 100 mg twice daily x 10 days take with glass of water before meals> call me if it doesn't clear up     07/12/2023  f/u ov/Connie Lindsey re: bronchiectasis maint on flutter/ prn doxy   Chief Complaint  Patient presents with   Follow-up    Doing well.  Dyspnea:  not limited by breathing / somewhat by hips/ knees  Cough: white to slt yellow x last year / no worse in am  Sleeping: bed blocks/ cushion under mattress / s  resp cc  SABA use: none  02: none    No obvious day  to day or daytime variability or assoc excess/ purulent sputum or mucus plugs or hemoptysis or cp or chest tightness, subjective wheeze or overt sinus or hb symptoms.    Also denies any obvious fluctuation of symptoms with weather or environmental changes or other aggravating or alleviating factors except as outlined above   No unusual exposure hx or h/o childhood pna/ asthma or knowledge of premature birth.  Current Allergies, Complete Past Medical History, Past Surgical History, Family History, and Social History were reviewed in [employer] record.  ROS  The following are not active complaints unless bolded Hoarseness, sore throat, dysphagia, dental problems, itching, sneezing,  nasal congestion or discharge of excess mucus or purulent secretions, ear ache,   fever, chills, sweats, unintended wt loss or wt gain, classically pleuritic or exertional cp,  orthopnea pnd or  arm/hand swelling  or leg swelling, presyncope, palpitations, abdominal pain, anorexia, nausea, vomiting, diarrhea  or change in bowel habits or change in bladder habits, change in stools or change in urine, dysuria, hematuria,  rash, arthralgias, visual complaints, headache, numbness, weakness or ataxia or problems with walking or coordination,  change in mood or  memory.        Current Meds  Medication Sig   amLODipine (NORVASC) 5 MG tablet Take 5 mg by mouth daily.   CALCIUM PO Take 600 mg by mouth 2 (two) times daily.    Carboxymethylcellul-Glycerin (REFRESH OPTIVE OP) Place 1 drop into both eyes 2 (two) times daily.   clobetasol (TEMOVATE) 0.05 % external solution As needed   COVID-19 mRNA vaccine 2023-2024 (COMIRNATY) syringe Inject into the muscle.   hydrocortisone valerate cream (WESTCORT) 0.2 % Apply 1 application topically as needed.    metroNIDAZOLE (METROCREAM) 0.75 % cream Apply 1 application topically as needed (for rosacea).    MULTIPLE MINERALS PO Take 1 capsule by mouth daily.   omeprazole (PRILOSEC) 20 MG capsule Take 20 mg by mouth daily.   Respiratory Therapy Supplies (FLUTTER) DEVI 1 Device by Does not apply route as needed.                     Objective:    wts  07/12/2023        132  07/26/2022          138  06/30/2021          133  06/24/2020          140 12/06/2019        141  12/12/2018        141  06/12/2018        143   02/28/2018      139   01/16/18 138 lb 9.6 oz (62.9 kg)  12/05/17 139 lb (63 kg)  07/17/14 141 lb (64 kg)    Vital signs reviewed  07/12/2023  - Note at rest 02 sats  98% on RA   General appearance:    healthy appearing amb wf   Lungs clear                Assessment

## 2023-07-12 ENCOUNTER — Ambulatory Visit: Payer: Medicare PPO

## 2023-07-12 ENCOUNTER — Ambulatory Visit: Payer: Medicare PPO | Admitting: Internal Medicine

## 2023-07-12 ENCOUNTER — Encounter: Payer: Self-pay | Admitting: Internal Medicine

## 2023-07-12 VITALS — BP 138/82 | HR 92 | Temp 98.2°F | Ht 63.5 in | Wt 132.2 lb

## 2023-07-12 DIAGNOSIS — J479 Bronchiectasis, uncomplicated: Secondary | ICD-10-CM

## 2023-07-12 DIAGNOSIS — R059 Cough, unspecified: Secondary | ICD-10-CM | POA: Diagnosis not present

## 2023-07-12 NOTE — Patient Instructions (Signed)
 For cough/ congestion > mucinex or mucinex dm  up to maximum of  1200 mg every 12 hours and use the flutter valve as much as you can    Please remember to go to the  x-ray department  for your tests - we will call you with the results when they are available     Please schedule a follow up visit in 12  months but call sooner if needed

## 2023-07-13 ENCOUNTER — Encounter: Payer: Self-pay | Admitting: Internal Medicine

## 2023-07-13 NOTE — Assessment & Plan Note (Addendum)
 Onset cough 2017  Trial of ppi bid 12/05/2017  - Allergy profile 12/05/2017 >  Eos 0.2 /  IgE  695 RAST Pos ragweed/ dust  - Sinus CT 12/13/2017 > ok  - HRCT chest 12/13/2017 >>>  Moderate cylindrical and varicoid bronchiectasis scattered throughout both lungs involving all lung lobes, with associated patchy tree-in-bud opacities. Findings could be due to atypical mycobacterial infection (MAI) or recurrent aspiration. 2. Thick irregular bandlike focus of peribronchovascular consolidation in the basilar right lower lobe, favor evolving postinfectious/postinflammatory scarring.   - Alpha one Screen 12/05/17 :  MM  Level 165  - Quant Ig's 01/16/2018 > nl  - flutter valve training 01/16/2018  - Spirometry 02/28/2018  FEV1 1.35 (67%)  Ratio 67  With truncated upper portion of f/v loop -  12/2017 rx zpak cycles  > stopped working winter 2021 assoc with nasal watery drainage so d/c'd and rec sputum sample with next flare then ? Try doxy? As can't take sulfa or pcn   Her symptoms are well controlled on a conservative rx of mucinex/ flutter and prn doxy and she wanted to go over again with her husband why more CT chest scans are not being done in this setting;  Explained: This is an extremely common benign condition (bronchiectasis/ MAI)  in the elderly and does not warrant aggressive eval/ rx at this point unless there is a clinical correlation suggesting unaddressed pulmonary infection (purulent sputum, night sweats, unintended wt loss, doe) or evolution of  obvious changes on plain cxr (as opposed to serial CT, which is way over sensitive to make clinical decisions re intervention and treatment in the elderly, who tend to tolerate both dx and treatment poorly) .  In fact, in study published July 2024 in [facility],  patients with bronchiectasis with vs without proven atypical TB had similar outcomes x 5 years of the study, in temis of admits/loss of lung function, exac and death.   Ct chest is 40 x the radiation  as plain cxr and no indication clinically to get one at this point.  F/u can be yearly, call sooner prn     Each maintenance medication was reviewed in detail including emphasizing most importantly the difference between maintenance and prns and under what circumstances the prns are to be triggered using an action plan format where appropriate.  Total time for H and P, chart review, counseling, reviewing flutter  device(s) and generating customized AVS unique to this office visit / same day charting = 31 min

## 2023-07-26 ENCOUNTER — Telehealth: Payer: Self-pay

## 2023-09-13 DIAGNOSIS — E7800 Pure hypercholesterolemia, unspecified: Secondary | ICD-10-CM | POA: Diagnosis not present

## 2023-09-13 DIAGNOSIS — D473 Essential (hemorrhagic) thrombocythemia: Secondary | ICD-10-CM | POA: Diagnosis not present

## 2023-09-13 DIAGNOSIS — M1990 Unspecified osteoarthritis, unspecified site: Secondary | ICD-10-CM | POA: Diagnosis not present

## 2023-09-13 DIAGNOSIS — K2100 Gastro-esophageal reflux disease with esophagitis, without bleeding: Secondary | ICD-10-CM | POA: Diagnosis not present

## 2023-09-13 DIAGNOSIS — I10 Essential (primary) hypertension: Secondary | ICD-10-CM | POA: Diagnosis not present

## 2023-09-13 NOTE — Telephone Encounter (Signed)
 error

## 2023-10-31 DIAGNOSIS — L570 Actinic keratosis: Secondary | ICD-10-CM | POA: Diagnosis not present

## 2023-10-31 DIAGNOSIS — L738 Other specified follicular disorders: Secondary | ICD-10-CM | POA: Diagnosis not present

## 2023-10-31 DIAGNOSIS — L820 Inflamed seborrheic keratosis: Secondary | ICD-10-CM | POA: Diagnosis not present

## 2023-10-31 DIAGNOSIS — Z85828 Personal history of other malignant neoplasm of skin: Secondary | ICD-10-CM | POA: Diagnosis not present

## 2023-10-31 DIAGNOSIS — L718 Other rosacea: Secondary | ICD-10-CM | POA: Diagnosis not present

## 2023-10-31 DIAGNOSIS — D2261 Melanocytic nevi of right upper limb, including shoulder: Secondary | ICD-10-CM | POA: Diagnosis not present

## 2023-10-31 DIAGNOSIS — L821 Other seborrheic keratosis: Secondary | ICD-10-CM | POA: Diagnosis not present

## 2023-10-31 DIAGNOSIS — I8391 Asymptomatic varicose veins of right lower extremity: Secondary | ICD-10-CM | POA: Diagnosis not present

## 2023-12-02 DIAGNOSIS — L239 Allergic contact dermatitis, unspecified cause: Secondary | ICD-10-CM | POA: Diagnosis not present

## 2023-12-02 DIAGNOSIS — Z85828 Personal history of other malignant neoplasm of skin: Secondary | ICD-10-CM | POA: Diagnosis not present

## 2023-12-02 DIAGNOSIS — L820 Inflamed seborrheic keratosis: Secondary | ICD-10-CM | POA: Diagnosis not present

## 2023-12-09 ENCOUNTER — Other Ambulatory Visit: Payer: Self-pay | Admitting: Family Medicine

## 2023-12-09 DIAGNOSIS — Z1231 Encounter for screening mammogram for malignant neoplasm of breast: Secondary | ICD-10-CM

## 2024-01-06 DIAGNOSIS — D509 Iron deficiency anemia, unspecified: Secondary | ICD-10-CM | POA: Diagnosis not present

## 2024-01-06 DIAGNOSIS — L309 Dermatitis, unspecified: Secondary | ICD-10-CM | POA: Diagnosis not present

## 2024-01-06 DIAGNOSIS — D485 Neoplasm of uncertain behavior of skin: Secondary | ICD-10-CM | POA: Diagnosis not present

## 2024-01-06 DIAGNOSIS — Z131 Encounter for screening for diabetes mellitus: Secondary | ICD-10-CM | POA: Diagnosis not present

## 2024-01-06 DIAGNOSIS — Z85828 Personal history of other malignant neoplasm of skin: Secondary | ICD-10-CM | POA: Diagnosis not present

## 2024-01-06 DIAGNOSIS — Z79899 Other long term (current) drug therapy: Secondary | ICD-10-CM | POA: Diagnosis not present

## 2024-01-06 DIAGNOSIS — R21 Rash and other nonspecific skin eruption: Secondary | ICD-10-CM | POA: Diagnosis not present

## 2024-01-06 DIAGNOSIS — L270 Generalized skin eruption due to drugs and medicaments taken internally: Secondary | ICD-10-CM | POA: Diagnosis not present

## 2024-01-13 DIAGNOSIS — L120 Bullous pemphigoid: Secondary | ICD-10-CM | POA: Diagnosis not present

## 2024-01-13 DIAGNOSIS — L270 Generalized skin eruption due to drugs and medicaments taken internally: Secondary | ICD-10-CM | POA: Diagnosis not present

## 2024-01-16 ENCOUNTER — Ambulatory Visit

## 2024-01-20 DIAGNOSIS — R238 Other skin changes: Secondary | ICD-10-CM | POA: Diagnosis not present

## 2024-01-20 DIAGNOSIS — L309 Dermatitis, unspecified: Secondary | ICD-10-CM | POA: Diagnosis not present

## 2024-01-20 DIAGNOSIS — D485 Neoplasm of uncertain behavior of skin: Secondary | ICD-10-CM | POA: Diagnosis not present

## 2024-01-20 DIAGNOSIS — Z85828 Personal history of other malignant neoplasm of skin: Secondary | ICD-10-CM | POA: Diagnosis not present

## 2024-02-03 DIAGNOSIS — Z85828 Personal history of other malignant neoplasm of skin: Secondary | ICD-10-CM | POA: Diagnosis not present

## 2024-02-03 DIAGNOSIS — L309 Dermatitis, unspecified: Secondary | ICD-10-CM | POA: Diagnosis not present

## 2024-03-05 DIAGNOSIS — L309 Dermatitis, unspecified: Secondary | ICD-10-CM | POA: Diagnosis not present

## 2024-03-05 DIAGNOSIS — Z85828 Personal history of other malignant neoplasm of skin: Secondary | ICD-10-CM | POA: Diagnosis not present

## 2024-04-09 DIAGNOSIS — I10 Essential (primary) hypertension: Secondary | ICD-10-CM | POA: Diagnosis not present

## 2024-04-09 DIAGNOSIS — D751 Secondary polycythemia: Secondary | ICD-10-CM | POA: Diagnosis not present

## 2024-04-09 DIAGNOSIS — Z79899 Other long term (current) drug therapy: Secondary | ICD-10-CM | POA: Diagnosis not present

## 2024-04-09 DIAGNOSIS — Z23 Encounter for immunization: Secondary | ICD-10-CM | POA: Diagnosis not present

## 2024-04-09 DIAGNOSIS — E7800 Pure hypercholesterolemia, unspecified: Secondary | ICD-10-CM | POA: Diagnosis not present

## 2024-04-09 DIAGNOSIS — Z0000 Encounter for general adult medical examination without abnormal findings: Secondary | ICD-10-CM | POA: Diagnosis not present

## 2024-04-09 DIAGNOSIS — R5383 Other fatigue: Secondary | ICD-10-CM | POA: Diagnosis not present

## 2024-04-09 DIAGNOSIS — K219 Gastro-esophageal reflux disease without esophagitis: Secondary | ICD-10-CM | POA: Diagnosis not present

## 2024-04-09 DIAGNOSIS — M8580 Other specified disorders of bone density and structure, unspecified site: Secondary | ICD-10-CM | POA: Diagnosis not present

## 2024-04-09 DIAGNOSIS — Z1211 Encounter for screening for malignant neoplasm of colon: Secondary | ICD-10-CM | POA: Diagnosis not present

## 2024-04-09 DIAGNOSIS — D473 Essential (hemorrhagic) thrombocythemia: Secondary | ICD-10-CM | POA: Diagnosis not present

## 2024-04-17 ENCOUNTER — Ambulatory Visit
Admission: RE | Admit: 2024-04-17 | Discharge: 2024-04-17 | Disposition: A | Source: Ambulatory Visit | Attending: Family Medicine

## 2024-04-17 DIAGNOSIS — Z1231 Encounter for screening mammogram for malignant neoplasm of breast: Secondary | ICD-10-CM

## 2024-04-27 ENCOUNTER — Ambulatory Visit: Payer: Self-pay | Admitting: Internal Medicine

## 2024-04-27 NOTE — Telephone Encounter (Signed)
 FYI Only or Action Required?: FYI only for provider: appointment scheduled on 05/01/2024.  Patient is followed in Pulmonology for bronchiectasis, last seen on 07/12/2023 by Darlean Ozell NOVAK, MD.  Called Nurse Triage reporting Cough, Nasal Congestion, and Shortness of Breath.  Symptoms began several months ago.  Interventions attempted: Nothing.  Symptoms are: unchanged.  Triage Disposition: See PCP Within 2 Weeks  Patient/caregiver understands and will follow disposition?: Yes  Copied from CRM [id number]. Topic: Clinical - Red Word Triage >> Apr 27, 2024  3:11 PM Rozanna G wrote: [facility] that prompted transfer to Nurse Triage: Coughing and congestion, with shortness of breath Reason for Disposition  Cough lasts > 3 weeks  Answer Assessment - Initial Assessment Questions Patient had been taking omeprazole which was controlling coughs, but stopped for a while at the suggestion of dermatology when a rash developed.   Has since restarted omeprazole, but continues to have cough   1. ONSET: When did the cough begin?      About two months ago 2. SEVERITY: How bad is the cough today?      Mild, but chronic 3. SPUTUM: Describe the color of your sputum (e.g., none, dry cough; clear, white, yellow, green)     Occasional productive cough, but not often (white/yellow) 4. HEMOPTYSIS: Are you coughing up any blood? If so ask: How much? (e.g., flecks, streaks, tablespoons, etc.)     denies 5. DIFFICULTY BREATHING: Are you having difficulty breathing? If Yes, ask: How bad is it? (e.g., mild, moderate, severe)      Some SOB with exertion 6. FEVER: Do you have a fever? If Yes, ask: What is your temperature, how was it measured, and when did it start?     denies 7. CARDIAC HISTORY: Do you have any history of heart disease? (e.g., heart attack, congestive heart failure)      HTN 8. LUNG HISTORY: Do you have any history of lung disease?  (e.g., pulmonary embolus, asthma,  emphysema) bronchiectasis  Protocols used: Cough - Chronic-A-AH

## 2024-05-01 ENCOUNTER — Encounter: Payer: Self-pay | Admitting: Pulmonary Disease

## 2024-05-01 ENCOUNTER — Ambulatory Visit: Admitting: Pulmonary Disease

## 2024-05-01 VITALS — BP 146/77 | HR 91 | Ht 63.5 in | Wt 127.0 lb

## 2024-05-01 DIAGNOSIS — J479 Bronchiectasis, uncomplicated: Secondary | ICD-10-CM

## 2024-05-01 DIAGNOSIS — R0602 Shortness of breath: Secondary | ICD-10-CM

## 2024-05-01 MED ORDER — ALBUTEROL SULFATE HFA 108 (90 BASE) MCG/ACT IN AERS: 2.0000 | INHALATION_SPRAY | Freq: Four times a day (QID) | RESPIRATORY_TRACT | 6 refills | Status: AC | PRN

## 2024-05-01 NOTE — Patient Instructions (Addendum)
 Schedule spirometry test at the front desk   Start albuterol  inhaler 1-2 puffs every 4-6 hours as needed  We will schedule you for a CT Chest to evaluate your bronchiectasis and shortness of breath  Follow up in 2 months, call sooner if needed

## 2024-05-01 NOTE — Progress Notes (Unsigned)
 Established Patient Pulmonology Office Visit   Subjective:  Patient ID: Connie Lindsey, female    DOB: 12/24/1945  MRN: [id number]  CC:  Chief Complaint  Patient presents with   Medical Management of Chronic Issues    Acute - rash from reflux off for a month now is back on - since coming off she has had cough and SOB     Discussed the use of AI scribe software for clinical note transcription with the patient, who gave verbal consent to proceed.  History of Present Illness Connie Lindsey is an 78 year old female with bronchiectasis who presents with shortness of breath.  She has had shortness of breath for about a month, starting after she restarted Prilosec (omeprazole) for reflux. This is associated with increased cough, dry throat, and her usual clear to yellow mucus. She denies fever, chills, wheezing, and night sweats. She notes some weight loss and often breathes through her mouth due to dyspnea.  In June she developed a severe diffuse rash that resolved after stopping Prilosec, and dermatology suspected the medication. She had been on Prilosec since 2019 and recently resumed it, and she is concerned it may now be contributing to her breathing symptoms.  She has bronchiectasis and worries that stopping or restarting Prilosec may have affected her bronchial tubes. She has not used inhalers but uses a Flutter device for airway clearance. She sleeps with her head elevated and avoids lying down for 2 to 3 hours after eating.  Recent blood work showed mildly elevated thyroid levels, monitored by her primary care physician. Other blood counts and chemistries were normal.        ROS    Current Outpatient Medications:    albuterol  (VENTOLIN  HFA) 108 (90 Base) MCG/ACT inhaler, Inhale 2 puffs into the lungs every 6 (six) hours as needed for wheezing or shortness of breath., Disp: 8 g, Rfl: 6   amLODipine (NORVASC) 5 MG tablet, Take 5 mg by mouth daily., Disp: , Rfl:    CALCIUM PO,  Take 600 mg by mouth 2 (two) times daily. , Disp: , Rfl:    Carboxymethylcellul-Glycerin (REFRESH OPTIVE OP), Place 1 drop into both eyes 2 (two) times daily., Disp: , Rfl:    COVID-19 mRNA vaccine 2023-2024 (COMIRNATY ) syringe, Inject into the muscle., Disp: 0.3 mL, Rfl: 0   MULTIPLE MINERALS PO, Take 1 capsule by mouth daily., Disp: , Rfl:    omeprazole (PRILOSEC) 20 MG capsule, Take 20 mg by mouth daily., Disp: , Rfl:    Respiratory Therapy Supplies (FLUTTER) DEVI, 1 Device by Does not apply route as needed., Disp: 1 each, Rfl: 0   clobetasol (TEMOVATE) 0.05 % external solution, As needed (Patient not taking: Reported on 05/01/2024), Disp: , Rfl:    hydrocortisone valerate cream (WESTCORT) 0.2 %, Apply 1 application topically as needed.  (Patient not taking: Reported on 05/01/2024), Disp: , Rfl:    metroNIDAZOLE (METROCREAM) 0.75 % cream, Apply 1 application topically as needed (for rosacea).  (Patient not taking: Reported on 05/01/2024), Disp: , Rfl:       Objective:  BP (!) 146/77   Pulse 91   Ht 5' 3.5 (1.613 m) Comment: per pt  Wt 127 lb (57.6 kg)   SpO2 95%   BMI 22.14 kg/m     Physical Exam Constitutional:      General: She is not in acute distress.    Appearance: Normal appearance.  Eyes:     General: No scleral icterus.  Conjunctiva/sclera: Conjunctivae normal.  Cardiovascular:     Rate and Rhythm: Normal rate and regular rhythm.  Pulmonary:     Breath sounds: No wheezing, rhonchi or rales.  Musculoskeletal:     Right lower leg: No edema.     Left lower leg: No edema.  Skin:    General: Skin is warm and dry.  Neurological:     General: No focal deficit present.      Diagnostic Review:  Last CBC Lab Results  Component Value Date   WBC 9.3 10/12/2022   HGB 13.4 10/12/2022   HCT 40.2 10/12/2022   HCT 37.6 10/12/2022   MCV 92.2 10/12/2022   MCH 32.8 10/12/2022   RDW 14.3 10/12/2022   PLT 325 10/12/2022   Last metabolic panel Lab Results  Component  Value Date   GLUCOSE 88 10/12/2022   NA 136 10/12/2022   K 3.6 10/12/2022   CL 101 10/12/2022   CO2 25 10/12/2022   BUN 17 10/12/2022   CREATININE 0.68 10/12/2022   GFRNONAA >60 10/12/2022   CALCIUM 9.2 10/12/2022   PROT 8.6 (H) 10/12/2022   ALBUMIN 4.2 10/12/2022   BILITOT 0.5 10/12/2022   ALKPHOS 95 10/12/2022   AST 24 10/12/2022   ALT 22 10/12/2022   ANIONGAP 10 10/12/2022       Assessment & Plan:   Assessment & Plan Bronchiectasis without complication (HCC)  Orders:   CT CHEST HIGH RESOLUTION; Future   Spirometry with graph; Future  Shortness of breath  Orders:   Spirometry with graph; Future   albuterol  (VENTOLIN  HFA) 108 (90 Base) MCG/ACT inhaler; Inhale 2 puffs into the lungs every 6 (six) hours as needed for wheezing or shortness of breath.   Assessment and Plan Assessment & Plan Bronchiectasis Mild, well-managed with recent exacerbation. Concerns about progression and infection. - Ordered CT scan to evaluate bronchiectasis and lung condition. - Scheduled pulmonary function tests. - Consider bronchoscopy if CT shows progression and sputum sample cannot be obtained. - Prescribed albuterol  inhaler for as-needed use, up to six times a day. - Consider maintenance inhaler if albuterol  is used frequently.  Shortness of breath Recent onset. - Prescribed albuterol  inhaler for as-needed use, up to six times a day. - Scheduled pulmonary function tests. - Ordered CT scan to evaluate lung condition. - Consider maintenance inhaler if albuterol  is used frequently.      Return in about 2 months (around 07/02/2024) for f/u visit Dr. Kara.   Dorn KATHEE Kara, MD

## 2024-05-01 NOTE — Assessment & Plan Note (Signed)
  Orders:   CT CHEST HIGH RESOLUTION; Future   Spirometry with graph; Future

## 2024-05-02 DIAGNOSIS — N958 Other specified menopausal and perimenopausal disorders: Secondary | ICD-10-CM | POA: Diagnosis not present

## 2024-05-02 DIAGNOSIS — B009 Herpesviral infection, unspecified: Secondary | ICD-10-CM | POA: Diagnosis not present

## 2024-05-02 DIAGNOSIS — Z01419 Encounter for gynecological examination (general) (routine) without abnormal findings: Secondary | ICD-10-CM | POA: Diagnosis not present

## 2024-05-02 DIAGNOSIS — M816 Localized osteoporosis [Lequesne]: Secondary | ICD-10-CM | POA: Diagnosis not present

## 2024-05-02 DIAGNOSIS — Z6822 Body mass index (BMI) 22.0-22.9, adult: Secondary | ICD-10-CM | POA: Diagnosis not present

## 2024-05-07 ENCOUNTER — Encounter: Payer: Self-pay | Admitting: Pulmonary Disease

## 2024-05-10 ENCOUNTER — Encounter: Payer: Self-pay | Admitting: Pulmonary Disease

## 2024-05-11 ENCOUNTER — Inpatient Hospital Stay: Admission: RE | Admit: 2024-05-11 | Discharge: 2024-05-11 | Attending: Pulmonary Disease

## 2024-05-11 DIAGNOSIS — J479 Bronchiectasis, uncomplicated: Secondary | ICD-10-CM

## 2024-05-24 ENCOUNTER — Ambulatory Visit: Payer: Self-pay | Admitting: Pulmonary Disease

## 2024-05-25 NOTE — Telephone Encounter (Addendum)
" ° °  X3 - all 3 numbers are land line - rang no way to leave a message   X2  Tried to reach patient both # listed are the same each time a busy signal  "

## 2024-06-26 ENCOUNTER — Other Ambulatory Visit: Payer: Self-pay | Admitting: *Deleted

## 2024-06-26 DIAGNOSIS — J479 Bronchiectasis, uncomplicated: Secondary | ICD-10-CM

## 2024-06-26 DIAGNOSIS — R0602 Shortness of breath: Secondary | ICD-10-CM

## 2024-06-28 ENCOUNTER — Ambulatory Visit: Admitting: *Deleted

## 2024-06-28 DIAGNOSIS — J479 Bronchiectasis, uncomplicated: Secondary | ICD-10-CM

## 2024-06-28 DIAGNOSIS — R0602 Shortness of breath: Secondary | ICD-10-CM

## 2024-06-28 NOTE — Progress Notes (Signed)
 Spirometry performed today. No post per MD ordered.

## 2024-06-28 NOTE — Patient Instructions (Signed)
Spirometry performed today. 

## 2024-07-02 ENCOUNTER — Ambulatory Visit: Admitting: Pulmonary Disease

## 2024-07-30 ENCOUNTER — Ambulatory Visit: Admitting: Internal Medicine
